# Patient Record
Sex: Female | Born: 1935
Health system: Southern US, Community
[De-identification: ages and names within clinical notes are randomized; demographics above are authoritative.]

## PROBLEM LIST (undated history)

## (undated) DIAGNOSIS — I10 Essential (primary) hypertension: Secondary | ICD-10-CM

## (undated) DIAGNOSIS — E785 Hyperlipidemia, unspecified: Secondary | ICD-10-CM

## (undated) DIAGNOSIS — E119 Type 2 diabetes mellitus without complications: Secondary | ICD-10-CM

## (undated) DIAGNOSIS — I639 Cerebral infarction, unspecified: Secondary | ICD-10-CM

## (undated) HISTORY — DX: Cerebral infarction, unspecified: I63.9

---

## 2007-12-03 ENCOUNTER — Other Ambulatory Visit: Admission: RE | Admit: 2007-12-03 | Discharge: 2007-12-03 | Payer: Self-pay | Admitting: Internal Medicine

## 2008-01-18 ENCOUNTER — Encounter: Admission: RE | Admit: 2008-01-18 | Discharge: 2008-01-18 | Payer: Self-pay | Admitting: Internal Medicine

## 2009-01-20 ENCOUNTER — Encounter: Admission: RE | Admit: 2009-01-20 | Discharge: 2009-01-20 | Payer: Self-pay | Admitting: Internal Medicine

## 2010-02-17 ENCOUNTER — Encounter
Admission: RE | Admit: 2010-02-17 | Discharge: 2010-02-17 | Payer: Self-pay | Source: Home / Self Care | Attending: Internal Medicine | Admitting: Internal Medicine

## 2011-01-17 ENCOUNTER — Other Ambulatory Visit: Payer: Self-pay | Admitting: Internal Medicine

## 2011-01-17 DIAGNOSIS — Z1231 Encounter for screening mammogram for malignant neoplasm of breast: Secondary | ICD-10-CM

## 2011-02-23 ENCOUNTER — Ambulatory Visit: Payer: Self-pay

## 2011-03-04 ENCOUNTER — Ambulatory Visit
Admission: RE | Admit: 2011-03-04 | Discharge: 2011-03-04 | Disposition: A | Discharge status: Home / Self Care | Payer: Medicare Other | Source: Ambulatory Visit | Attending: Internal Medicine | Admitting: Internal Medicine

## 2011-03-04 DIAGNOSIS — Z1231 Encounter for screening mammogram for malignant neoplasm of breast: Secondary | ICD-10-CM

## 2012-02-08 ENCOUNTER — Other Ambulatory Visit: Payer: Self-pay | Admitting: Internal Medicine

## 2012-02-08 DIAGNOSIS — Z1231 Encounter for screening mammogram for malignant neoplasm of breast: Secondary | ICD-10-CM

## 2012-03-14 ENCOUNTER — Ambulatory Visit
Admission: RE | Admit: 2012-03-14 | Discharge: 2012-03-14 | Disposition: A | Discharge status: Home / Self Care | Payer: Medicare HMO | Source: Ambulatory Visit | Attending: Internal Medicine | Admitting: Internal Medicine

## 2012-03-14 DIAGNOSIS — Z1231 Encounter for screening mammogram for malignant neoplasm of breast: Secondary | ICD-10-CM

## 2012-03-20 ENCOUNTER — Other Ambulatory Visit: Payer: Self-pay | Admitting: Internal Medicine

## 2012-03-20 DIAGNOSIS — R928 Other abnormal and inconclusive findings on diagnostic imaging of breast: Secondary | ICD-10-CM

## 2012-04-18 ENCOUNTER — Ambulatory Visit
Admission: RE | Admit: 2012-04-18 | Discharge: 2012-04-18 | Disposition: A | Discharge status: Home / Self Care | Payer: Medicare PPO | Source: Ambulatory Visit | Attending: Internal Medicine | Admitting: Internal Medicine

## 2012-04-18 DIAGNOSIS — R928 Other abnormal and inconclusive findings on diagnostic imaging of breast: Secondary | ICD-10-CM

## 2012-11-07 ENCOUNTER — Other Ambulatory Visit: Payer: Self-pay | Admitting: Internal Medicine

## 2012-11-07 DIAGNOSIS — R921 Mammographic calcification found on diagnostic imaging of breast: Secondary | ICD-10-CM

## 2013-10-07 ENCOUNTER — Encounter (INDEPENDENT_AMBULATORY_CARE_PROVIDER_SITE_OTHER): Payer: Commercial Managed Care - HMO | Admitting: Ophthalmology

## 2013-10-07 DIAGNOSIS — H43819 Vitreous degeneration, unspecified eye: Secondary | ICD-10-CM

## 2013-10-07 DIAGNOSIS — H3530 Unspecified macular degeneration: Secondary | ICD-10-CM

## 2013-10-07 DIAGNOSIS — I10 Essential (primary) hypertension: Secondary | ICD-10-CM

## 2013-10-07 DIAGNOSIS — H35039 Hypertensive retinopathy, unspecified eye: Secondary | ICD-10-CM

## 2013-10-07 DIAGNOSIS — H353290 Exudative age-related macular degeneration, unspecified eye, stage unspecified: Secondary | ICD-10-CM

## 2013-11-13 ENCOUNTER — Encounter (INDEPENDENT_AMBULATORY_CARE_PROVIDER_SITE_OTHER): Payer: Commercial Managed Care - HMO | Admitting: Ophthalmology

## 2013-11-13 DIAGNOSIS — H353290 Exudative age-related macular degeneration, unspecified eye, stage unspecified: Secondary | ICD-10-CM

## 2013-11-13 DIAGNOSIS — H35039 Hypertensive retinopathy, unspecified eye: Secondary | ICD-10-CM

## 2013-11-13 DIAGNOSIS — I10 Essential (primary) hypertension: Secondary | ICD-10-CM

## 2013-11-13 DIAGNOSIS — H2510 Age-related nuclear cataract, unspecified eye: Secondary | ICD-10-CM

## 2013-11-13 DIAGNOSIS — H3530 Unspecified macular degeneration: Secondary | ICD-10-CM

## 2013-11-13 DIAGNOSIS — H43819 Vitreous degeneration, unspecified eye: Secondary | ICD-10-CM

## 2013-12-23 ENCOUNTER — Encounter (INDEPENDENT_AMBULATORY_CARE_PROVIDER_SITE_OTHER): Payer: Commercial Managed Care - HMO | Admitting: Ophthalmology

## 2013-12-23 DIAGNOSIS — H2513 Age-related nuclear cataract, bilateral: Secondary | ICD-10-CM

## 2013-12-23 DIAGNOSIS — E11329 Type 2 diabetes mellitus with mild nonproliferative diabetic retinopathy without macular edema: Secondary | ICD-10-CM

## 2013-12-23 DIAGNOSIS — H35033 Hypertensive retinopathy, bilateral: Secondary | ICD-10-CM

## 2013-12-23 DIAGNOSIS — H43813 Vitreous degeneration, bilateral: Secondary | ICD-10-CM | POA: Diagnosis not present

## 2013-12-23 DIAGNOSIS — I10 Essential (primary) hypertension: Secondary | ICD-10-CM | POA: Diagnosis not present

## 2013-12-23 DIAGNOSIS — E11319 Type 2 diabetes mellitus with unspecified diabetic retinopathy without macular edema: Secondary | ICD-10-CM | POA: Diagnosis not present

## 2013-12-23 DIAGNOSIS — H3532 Exudative age-related macular degeneration: Secondary | ICD-10-CM | POA: Diagnosis not present

## 2013-12-23 DIAGNOSIS — H3531 Nonexudative age-related macular degeneration: Secondary | ICD-10-CM | POA: Diagnosis not present

## 2013-12-25 ENCOUNTER — Encounter (INDEPENDENT_AMBULATORY_CARE_PROVIDER_SITE_OTHER): Payer: Commercial Managed Care - HMO | Admitting: Ophthalmology

## 2014-01-22 ENCOUNTER — Encounter (INDEPENDENT_AMBULATORY_CARE_PROVIDER_SITE_OTHER): Payer: Commercial Managed Care - HMO | Admitting: Ophthalmology

## 2014-01-22 DIAGNOSIS — H43813 Vitreous degeneration, bilateral: Secondary | ICD-10-CM | POA: Diagnosis not present

## 2014-01-22 DIAGNOSIS — H35033 Hypertensive retinopathy, bilateral: Secondary | ICD-10-CM

## 2014-01-22 DIAGNOSIS — H3532 Exudative age-related macular degeneration: Secondary | ICD-10-CM

## 2014-01-22 DIAGNOSIS — I10 Essential (primary) hypertension: Secondary | ICD-10-CM | POA: Diagnosis not present

## 2014-01-22 DIAGNOSIS — H3531 Nonexudative age-related macular degeneration: Secondary | ICD-10-CM

## 2014-01-22 DIAGNOSIS — E11329 Type 2 diabetes mellitus with mild nonproliferative diabetic retinopathy without macular edema: Secondary | ICD-10-CM

## 2014-01-22 DIAGNOSIS — E11319 Type 2 diabetes mellitus with unspecified diabetic retinopathy without macular edema: Secondary | ICD-10-CM

## 2014-01-27 ENCOUNTER — Encounter (INDEPENDENT_AMBULATORY_CARE_PROVIDER_SITE_OTHER): Payer: Commercial Managed Care - HMO | Admitting: Ophthalmology

## 2014-03-04 ENCOUNTER — Encounter (INDEPENDENT_AMBULATORY_CARE_PROVIDER_SITE_OTHER): Payer: Commercial Managed Care - HMO | Admitting: Ophthalmology

## 2014-03-17 ENCOUNTER — Encounter (INDEPENDENT_AMBULATORY_CARE_PROVIDER_SITE_OTHER): Payer: Commercial Managed Care - HMO | Admitting: Ophthalmology

## 2014-03-17 DIAGNOSIS — E11329 Type 2 diabetes mellitus with mild nonproliferative diabetic retinopathy without macular edema: Secondary | ICD-10-CM

## 2014-03-17 DIAGNOSIS — H3531 Nonexudative age-related macular degeneration: Secondary | ICD-10-CM

## 2014-03-17 DIAGNOSIS — H2513 Age-related nuclear cataract, bilateral: Secondary | ICD-10-CM | POA: Diagnosis not present

## 2014-03-17 DIAGNOSIS — H3532 Exudative age-related macular degeneration: Secondary | ICD-10-CM | POA: Diagnosis not present

## 2014-03-17 DIAGNOSIS — H35033 Hypertensive retinopathy, bilateral: Secondary | ICD-10-CM

## 2014-03-17 DIAGNOSIS — H43813 Vitreous degeneration, bilateral: Secondary | ICD-10-CM

## 2014-03-17 DIAGNOSIS — E11319 Type 2 diabetes mellitus with unspecified diabetic retinopathy without macular edema: Secondary | ICD-10-CM

## 2014-05-12 ENCOUNTER — Encounter (INDEPENDENT_AMBULATORY_CARE_PROVIDER_SITE_OTHER): Payer: Commercial Managed Care - HMO | Admitting: Ophthalmology

## 2014-05-12 DIAGNOSIS — H43813 Vitreous degeneration, bilateral: Secondary | ICD-10-CM

## 2014-05-12 DIAGNOSIS — I10 Essential (primary) hypertension: Secondary | ICD-10-CM

## 2014-05-12 DIAGNOSIS — H3532 Exudative age-related macular degeneration: Secondary | ICD-10-CM | POA: Diagnosis not present

## 2014-05-12 DIAGNOSIS — H35033 Hypertensive retinopathy, bilateral: Secondary | ICD-10-CM | POA: Diagnosis not present

## 2014-05-12 DIAGNOSIS — E11319 Type 2 diabetes mellitus with unspecified diabetic retinopathy without macular edema: Secondary | ICD-10-CM | POA: Diagnosis not present

## 2014-05-12 DIAGNOSIS — E11329 Type 2 diabetes mellitus with mild nonproliferative diabetic retinopathy without macular edema: Secondary | ICD-10-CM

## 2014-05-12 DIAGNOSIS — H3531 Nonexudative age-related macular degeneration: Secondary | ICD-10-CM | POA: Diagnosis not present

## 2014-07-04 DIAGNOSIS — M810 Age-related osteoporosis without current pathological fracture: Secondary | ICD-10-CM | POA: Diagnosis not present

## 2014-07-04 DIAGNOSIS — R269 Unspecified abnormalities of gait and mobility: Secondary | ICD-10-CM | POA: Diagnosis not present

## 2014-07-04 DIAGNOSIS — E1165 Type 2 diabetes mellitus with hyperglycemia: Secondary | ICD-10-CM | POA: Diagnosis not present

## 2014-07-04 DIAGNOSIS — Z1389 Encounter for screening for other disorder: Secondary | ICD-10-CM | POA: Diagnosis not present

## 2014-07-04 DIAGNOSIS — E039 Hypothyroidism, unspecified: Secondary | ICD-10-CM | POA: Diagnosis not present

## 2014-07-04 DIAGNOSIS — I10 Essential (primary) hypertension: Secondary | ICD-10-CM | POA: Diagnosis not present

## 2014-07-04 DIAGNOSIS — Z0000 Encounter for general adult medical examination without abnormal findings: Secondary | ICD-10-CM | POA: Diagnosis not present

## 2014-07-04 DIAGNOSIS — E782 Mixed hyperlipidemia: Secondary | ICD-10-CM | POA: Diagnosis not present

## 2014-07-24 DIAGNOSIS — M25551 Pain in right hip: Secondary | ICD-10-CM | POA: Diagnosis not present

## 2014-08-04 ENCOUNTER — Encounter (INDEPENDENT_AMBULATORY_CARE_PROVIDER_SITE_OTHER): Payer: Commercial Managed Care - HMO | Admitting: Ophthalmology

## 2014-08-08 DIAGNOSIS — M25551 Pain in right hip: Secondary | ICD-10-CM | POA: Diagnosis not present

## 2014-08-12 DIAGNOSIS — R262 Difficulty in walking, not elsewhere classified: Secondary | ICD-10-CM | POA: Diagnosis not present

## 2014-08-12 DIAGNOSIS — R0789 Other chest pain: Secondary | ICD-10-CM | POA: Diagnosis not present

## 2014-08-15 ENCOUNTER — Encounter (INDEPENDENT_AMBULATORY_CARE_PROVIDER_SITE_OTHER): Payer: Commercial Managed Care - HMO | Admitting: Ophthalmology

## 2014-08-15 DIAGNOSIS — H3532 Exudative age-related macular degeneration: Secondary | ICD-10-CM | POA: Diagnosis not present

## 2014-08-15 DIAGNOSIS — E11329 Type 2 diabetes mellitus with mild nonproliferative diabetic retinopathy without macular edema: Secondary | ICD-10-CM

## 2014-08-15 DIAGNOSIS — H35033 Hypertensive retinopathy, bilateral: Secondary | ICD-10-CM

## 2014-08-15 DIAGNOSIS — E11319 Type 2 diabetes mellitus with unspecified diabetic retinopathy without macular edema: Secondary | ICD-10-CM | POA: Diagnosis not present

## 2014-08-15 DIAGNOSIS — H43813 Vitreous degeneration, bilateral: Secondary | ICD-10-CM | POA: Diagnosis not present

## 2014-08-15 DIAGNOSIS — I10 Essential (primary) hypertension: Secondary | ICD-10-CM | POA: Diagnosis not present

## 2014-08-15 DIAGNOSIS — H3531 Nonexudative age-related macular degeneration: Secondary | ICD-10-CM

## 2014-08-27 DIAGNOSIS — M25551 Pain in right hip: Secondary | ICD-10-CM | POA: Diagnosis not present

## 2014-11-05 ENCOUNTER — Other Ambulatory Visit: Payer: Self-pay | Admitting: Internal Medicine

## 2014-11-05 ENCOUNTER — Ambulatory Visit
Admission: RE | Admit: 2014-11-05 | Discharge: 2014-11-05 | Disposition: A | Discharge status: Home / Self Care | Payer: Commercial Managed Care - HMO | Source: Ambulatory Visit | Attending: Internal Medicine | Admitting: Internal Medicine

## 2014-11-05 DIAGNOSIS — M25559 Pain in unspecified hip: Secondary | ICD-10-CM | POA: Diagnosis not present

## 2014-11-05 DIAGNOSIS — E1165 Type 2 diabetes mellitus with hyperglycemia: Secondary | ICD-10-CM | POA: Diagnosis not present

## 2014-11-05 DIAGNOSIS — M25551 Pain in right hip: Secondary | ICD-10-CM

## 2014-11-05 DIAGNOSIS — M1611 Unilateral primary osteoarthritis, right hip: Secondary | ICD-10-CM | POA: Diagnosis not present

## 2014-11-05 DIAGNOSIS — E039 Hypothyroidism, unspecified: Secondary | ICD-10-CM | POA: Diagnosis not present

## 2014-11-05 DIAGNOSIS — M810 Age-related osteoporosis without current pathological fracture: Secondary | ICD-10-CM | POA: Diagnosis not present

## 2014-11-05 DIAGNOSIS — I10 Essential (primary) hypertension: Secondary | ICD-10-CM | POA: Diagnosis not present

## 2014-11-05 DIAGNOSIS — E782 Mixed hyperlipidemia: Secondary | ICD-10-CM | POA: Diagnosis not present

## 2014-11-24 ENCOUNTER — Encounter (INDEPENDENT_AMBULATORY_CARE_PROVIDER_SITE_OTHER): Payer: Commercial Managed Care - HMO | Admitting: Ophthalmology

## 2014-12-19 ENCOUNTER — Encounter (INDEPENDENT_AMBULATORY_CARE_PROVIDER_SITE_OTHER): Payer: Commercial Managed Care - HMO | Admitting: Ophthalmology

## 2014-12-19 DIAGNOSIS — H353211 Exudative age-related macular degeneration, right eye, with active choroidal neovascularization: Secondary | ICD-10-CM | POA: Diagnosis not present

## 2014-12-19 DIAGNOSIS — H35033 Hypertensive retinopathy, bilateral: Secondary | ICD-10-CM | POA: Diagnosis not present

## 2014-12-19 DIAGNOSIS — I10 Essential (primary) hypertension: Secondary | ICD-10-CM

## 2014-12-19 DIAGNOSIS — H353122 Nonexudative age-related macular degeneration, left eye, intermediate dry stage: Secondary | ICD-10-CM

## 2014-12-19 DIAGNOSIS — E113213 Type 2 diabetes mellitus with mild nonproliferative diabetic retinopathy with macular edema, bilateral: Secondary | ICD-10-CM

## 2014-12-19 DIAGNOSIS — H43813 Vitreous degeneration, bilateral: Secondary | ICD-10-CM

## 2014-12-19 DIAGNOSIS — E11319 Type 2 diabetes mellitus with unspecified diabetic retinopathy without macular edema: Secondary | ICD-10-CM

## 2015-01-28 DIAGNOSIS — H35033 Hypertensive retinopathy, bilateral: Secondary | ICD-10-CM | POA: Diagnosis not present

## 2015-01-28 DIAGNOSIS — H524 Presbyopia: Secondary | ICD-10-CM | POA: Diagnosis not present

## 2015-01-28 DIAGNOSIS — E119 Type 2 diabetes mellitus without complications: Secondary | ICD-10-CM | POA: Diagnosis not present

## 2015-01-28 DIAGNOSIS — H353212 Exudative age-related macular degeneration, right eye, with inactive choroidal neovascularization: Secondary | ICD-10-CM | POA: Diagnosis not present

## 2015-01-28 DIAGNOSIS — H2512 Age-related nuclear cataract, left eye: Secondary | ICD-10-CM | POA: Diagnosis not present

## 2015-01-28 DIAGNOSIS — H353121 Nonexudative age-related macular degeneration, left eye, early dry stage: Secondary | ICD-10-CM | POA: Diagnosis not present

## 2015-01-28 DIAGNOSIS — H25012 Cortical age-related cataract, left eye: Secondary | ICD-10-CM | POA: Diagnosis not present

## 2015-01-28 DIAGNOSIS — H3563 Retinal hemorrhage, bilateral: Secondary | ICD-10-CM | POA: Diagnosis not present

## 2015-03-17 DIAGNOSIS — H2512 Age-related nuclear cataract, left eye: Secondary | ICD-10-CM | POA: Diagnosis not present

## 2015-04-10 ENCOUNTER — Encounter (INDEPENDENT_AMBULATORY_CARE_PROVIDER_SITE_OTHER): Payer: Commercial Managed Care - HMO | Admitting: Ophthalmology

## 2015-05-06 ENCOUNTER — Encounter (INDEPENDENT_AMBULATORY_CARE_PROVIDER_SITE_OTHER): Payer: Commercial Managed Care - HMO | Admitting: Ophthalmology

## 2015-05-07 ENCOUNTER — Encounter (INDEPENDENT_AMBULATORY_CARE_PROVIDER_SITE_OTHER): Payer: Commercial Managed Care - HMO | Admitting: Ophthalmology

## 2015-05-07 DIAGNOSIS — H353123 Nonexudative age-related macular degeneration, left eye, advanced atrophic without subfoveal involvement: Secondary | ICD-10-CM | POA: Diagnosis not present

## 2015-05-07 DIAGNOSIS — H353211 Exudative age-related macular degeneration, right eye, with active choroidal neovascularization: Secondary | ICD-10-CM

## 2015-05-07 DIAGNOSIS — H35033 Hypertensive retinopathy, bilateral: Secondary | ICD-10-CM | POA: Diagnosis not present

## 2015-05-07 DIAGNOSIS — H43813 Vitreous degeneration, bilateral: Secondary | ICD-10-CM

## 2015-05-07 DIAGNOSIS — I10 Essential (primary) hypertension: Secondary | ICD-10-CM | POA: Diagnosis not present

## 2015-07-15 DIAGNOSIS — J45909 Unspecified asthma, uncomplicated: Secondary | ICD-10-CM | POA: Diagnosis not present

## 2015-07-15 DIAGNOSIS — E039 Hypothyroidism, unspecified: Secondary | ICD-10-CM | POA: Diagnosis not present

## 2015-07-15 DIAGNOSIS — M810 Age-related osteoporosis without current pathological fracture: Secondary | ICD-10-CM | POA: Diagnosis not present

## 2015-07-15 DIAGNOSIS — M25559 Pain in unspecified hip: Secondary | ICD-10-CM | POA: Diagnosis not present

## 2015-07-15 DIAGNOSIS — E7800 Pure hypercholesterolemia, unspecified: Secondary | ICD-10-CM | POA: Diagnosis not present

## 2015-07-15 DIAGNOSIS — Z1389 Encounter for screening for other disorder: Secondary | ICD-10-CM | POA: Diagnosis not present

## 2015-07-15 DIAGNOSIS — Z0000 Encounter for general adult medical examination without abnormal findings: Secondary | ICD-10-CM | POA: Diagnosis not present

## 2015-07-15 DIAGNOSIS — I10 Essential (primary) hypertension: Secondary | ICD-10-CM | POA: Diagnosis not present

## 2015-07-15 DIAGNOSIS — E119 Type 2 diabetes mellitus without complications: Secondary | ICD-10-CM | POA: Diagnosis not present

## 2015-07-20 DIAGNOSIS — M1611 Unilateral primary osteoarthritis, right hip: Secondary | ICD-10-CM | POA: Diagnosis not present

## 2015-07-20 DIAGNOSIS — M545 Low back pain: Secondary | ICD-10-CM | POA: Diagnosis not present

## 2015-09-16 DIAGNOSIS — M1611 Unilateral primary osteoarthritis, right hip: Secondary | ICD-10-CM | POA: Diagnosis not present

## 2015-10-05 ENCOUNTER — Encounter (INDEPENDENT_AMBULATORY_CARE_PROVIDER_SITE_OTHER): Payer: Self-pay

## 2015-10-05 ENCOUNTER — Encounter (INDEPENDENT_AMBULATORY_CARE_PROVIDER_SITE_OTHER): Payer: Commercial Managed Care - HMO | Admitting: Ophthalmology

## 2015-10-08 ENCOUNTER — Encounter (INDEPENDENT_AMBULATORY_CARE_PROVIDER_SITE_OTHER): Payer: Commercial Managed Care - HMO | Admitting: Ophthalmology

## 2015-10-14 ENCOUNTER — Encounter (INDEPENDENT_AMBULATORY_CARE_PROVIDER_SITE_OTHER): Payer: Commercial Managed Care - HMO | Admitting: Ophthalmology

## 2015-10-14 DIAGNOSIS — H35033 Hypertensive retinopathy, bilateral: Secondary | ICD-10-CM | POA: Diagnosis not present

## 2015-10-14 DIAGNOSIS — I10 Essential (primary) hypertension: Secondary | ICD-10-CM

## 2015-10-14 DIAGNOSIS — H353211 Exudative age-related macular degeneration, right eye, with active choroidal neovascularization: Secondary | ICD-10-CM

## 2015-10-14 DIAGNOSIS — H353122 Nonexudative age-related macular degeneration, left eye, intermediate dry stage: Secondary | ICD-10-CM

## 2015-10-14 DIAGNOSIS — H43813 Vitreous degeneration, bilateral: Secondary | ICD-10-CM | POA: Diagnosis not present

## 2015-10-22 ENCOUNTER — Encounter (INDEPENDENT_AMBULATORY_CARE_PROVIDER_SITE_OTHER): Payer: Commercial Managed Care - HMO | Admitting: Ophthalmology

## 2016-01-15 DIAGNOSIS — E039 Hypothyroidism, unspecified: Secondary | ICD-10-CM | POA: Diagnosis not present

## 2016-01-15 DIAGNOSIS — Z7984 Long term (current) use of oral hypoglycemic drugs: Secondary | ICD-10-CM | POA: Diagnosis not present

## 2016-01-15 DIAGNOSIS — I10 Essential (primary) hypertension: Secondary | ICD-10-CM | POA: Diagnosis not present

## 2016-01-15 DIAGNOSIS — M810 Age-related osteoporosis without current pathological fracture: Secondary | ICD-10-CM | POA: Diagnosis not present

## 2016-01-15 DIAGNOSIS — E119 Type 2 diabetes mellitus without complications: Secondary | ICD-10-CM | POA: Diagnosis not present

## 2016-01-15 DIAGNOSIS — E7800 Pure hypercholesterolemia, unspecified: Secondary | ICD-10-CM | POA: Diagnosis not present

## 2016-03-16 ENCOUNTER — Encounter (INDEPENDENT_AMBULATORY_CARE_PROVIDER_SITE_OTHER): Payer: Commercial Managed Care - HMO | Admitting: Ophthalmology

## 2016-05-09 DIAGNOSIS — M1611 Unilateral primary osteoarthritis, right hip: Secondary | ICD-10-CM | POA: Diagnosis not present

## 2016-05-16 DIAGNOSIS — M1611 Unilateral primary osteoarthritis, right hip: Secondary | ICD-10-CM | POA: Diagnosis not present

## 2016-09-08 DIAGNOSIS — I10 Essential (primary) hypertension: Secondary | ICD-10-CM | POA: Diagnosis not present

## 2016-09-08 DIAGNOSIS — Z0000 Encounter for general adult medical examination without abnormal findings: Secondary | ICD-10-CM | POA: Diagnosis not present

## 2016-09-08 DIAGNOSIS — E039 Hypothyroidism, unspecified: Secondary | ICD-10-CM | POA: Diagnosis not present

## 2016-09-08 DIAGNOSIS — M810 Age-related osteoporosis without current pathological fracture: Secondary | ICD-10-CM | POA: Diagnosis not present

## 2016-09-08 DIAGNOSIS — E7800 Pure hypercholesterolemia, unspecified: Secondary | ICD-10-CM | POA: Diagnosis not present

## 2016-09-08 DIAGNOSIS — Z1389 Encounter for screening for other disorder: Secondary | ICD-10-CM | POA: Diagnosis not present

## 2016-09-08 DIAGNOSIS — E1165 Type 2 diabetes mellitus with hyperglycemia: Secondary | ICD-10-CM | POA: Diagnosis not present

## 2016-10-03 DIAGNOSIS — E039 Hypothyroidism, unspecified: Secondary | ICD-10-CM | POA: Diagnosis not present

## 2017-02-02 DIAGNOSIS — R05 Cough: Secondary | ICD-10-CM | POA: Diagnosis not present

## 2017-03-16 DIAGNOSIS — I10 Essential (primary) hypertension: Secondary | ICD-10-CM | POA: Diagnosis not present

## 2017-03-16 DIAGNOSIS — E7800 Pure hypercholesterolemia, unspecified: Secondary | ICD-10-CM | POA: Diagnosis not present

## 2017-03-16 DIAGNOSIS — Z7984 Long term (current) use of oral hypoglycemic drugs: Secondary | ICD-10-CM | POA: Diagnosis not present

## 2017-03-16 DIAGNOSIS — M810 Age-related osteoporosis without current pathological fracture: Secondary | ICD-10-CM | POA: Diagnosis not present

## 2017-03-16 DIAGNOSIS — E039 Hypothyroidism, unspecified: Secondary | ICD-10-CM | POA: Diagnosis not present

## 2017-03-16 DIAGNOSIS — E119 Type 2 diabetes mellitus without complications: Secondary | ICD-10-CM | POA: Diagnosis not present

## 2017-09-15 DIAGNOSIS — Z7984 Long term (current) use of oral hypoglycemic drugs: Secondary | ICD-10-CM | POA: Diagnosis not present

## 2017-09-15 DIAGNOSIS — E039 Hypothyroidism, unspecified: Secondary | ICD-10-CM | POA: Diagnosis not present

## 2017-09-15 DIAGNOSIS — E1169 Type 2 diabetes mellitus with other specified complication: Secondary | ICD-10-CM | POA: Diagnosis not present

## 2017-09-15 DIAGNOSIS — Z0000 Encounter for general adult medical examination without abnormal findings: Secondary | ICD-10-CM | POA: Diagnosis not present

## 2017-09-15 DIAGNOSIS — I10 Essential (primary) hypertension: Secondary | ICD-10-CM | POA: Diagnosis not present

## 2017-09-15 DIAGNOSIS — E7800 Pure hypercholesterolemia, unspecified: Secondary | ICD-10-CM | POA: Diagnosis not present

## 2017-09-15 DIAGNOSIS — M810 Age-related osteoporosis without current pathological fracture: Secondary | ICD-10-CM | POA: Diagnosis not present

## 2017-09-15 DIAGNOSIS — Z135 Encounter for screening for eye and ear disorders: Secondary | ICD-10-CM | POA: Diagnosis not present

## 2017-09-15 DIAGNOSIS — Z1389 Encounter for screening for other disorder: Secondary | ICD-10-CM | POA: Diagnosis not present

## 2017-10-12 ENCOUNTER — Ambulatory Visit
Admission: RE | Admit: 2017-10-12 | Discharge: 2017-10-12 | Disposition: A | Discharge status: Home / Self Care | Payer: Commercial Managed Care - HMO | Source: Ambulatory Visit | Attending: Internal Medicine | Admitting: Internal Medicine

## 2017-10-12 ENCOUNTER — Other Ambulatory Visit: Payer: Self-pay | Admitting: Internal Medicine

## 2017-10-12 DIAGNOSIS — R05 Cough: Secondary | ICD-10-CM

## 2017-10-12 DIAGNOSIS — R0981 Nasal congestion: Secondary | ICD-10-CM | POA: Diagnosis not present

## 2017-10-12 DIAGNOSIS — R059 Cough, unspecified: Secondary | ICD-10-CM

## 2017-12-25 DIAGNOSIS — M1611 Unilateral primary osteoarthritis, right hip: Secondary | ICD-10-CM | POA: Diagnosis not present

## 2017-12-27 DIAGNOSIS — M1611 Unilateral primary osteoarthritis, right hip: Secondary | ICD-10-CM | POA: Diagnosis not present

## 2018-03-01 ENCOUNTER — Other Ambulatory Visit: Payer: Self-pay | Admitting: Internal Medicine

## 2018-03-01 ENCOUNTER — Ambulatory Visit
Admission: RE | Admit: 2018-03-01 | Discharge: 2018-03-01 | Disposition: A | Discharge status: Home / Self Care | Payer: Medicare HMO | Source: Ambulatory Visit | Attending: Internal Medicine | Admitting: Internal Medicine

## 2018-03-01 DIAGNOSIS — E1169 Type 2 diabetes mellitus with other specified complication: Secondary | ICD-10-CM | POA: Diagnosis not present

## 2018-03-01 DIAGNOSIS — M25551 Pain in right hip: Secondary | ICD-10-CM

## 2018-03-01 DIAGNOSIS — M1611 Unilateral primary osteoarthritis, right hip: Secondary | ICD-10-CM | POA: Diagnosis not present

## 2018-03-01 DIAGNOSIS — Z9181 History of falling: Secondary | ICD-10-CM | POA: Diagnosis not present

## 2018-03-01 DIAGNOSIS — R05 Cough: Secondary | ICD-10-CM | POA: Diagnosis not present

## 2018-03-01 DIAGNOSIS — I10 Essential (primary) hypertension: Secondary | ICD-10-CM | POA: Diagnosis not present

## 2018-03-01 DIAGNOSIS — R634 Abnormal weight loss: Secondary | ICD-10-CM | POA: Diagnosis not present

## 2018-03-01 DIAGNOSIS — E039 Hypothyroidism, unspecified: Secondary | ICD-10-CM | POA: Diagnosis not present

## 2018-03-01 DIAGNOSIS — E7800 Pure hypercholesterolemia, unspecified: Secondary | ICD-10-CM | POA: Diagnosis not present

## 2018-03-01 DIAGNOSIS — R053 Chronic cough: Secondary | ICD-10-CM

## 2018-03-05 ENCOUNTER — Ambulatory Visit
Admission: RE | Admit: 2018-03-05 | Discharge: 2018-03-05 | Disposition: A | Discharge status: Home / Self Care | Payer: Medicare HMO | Source: Ambulatory Visit | Attending: Internal Medicine | Admitting: Internal Medicine

## 2018-03-05 DIAGNOSIS — I700 Atherosclerosis of aorta: Secondary | ICD-10-CM | POA: Diagnosis not present

## 2018-03-05 DIAGNOSIS — R053 Chronic cough: Secondary | ICD-10-CM

## 2018-03-05 DIAGNOSIS — R05 Cough: Secondary | ICD-10-CM

## 2018-03-06 DIAGNOSIS — E7800 Pure hypercholesterolemia, unspecified: Secondary | ICD-10-CM | POA: Diagnosis not present

## 2018-03-06 DIAGNOSIS — I10 Essential (primary) hypertension: Secondary | ICD-10-CM | POA: Diagnosis not present

## 2018-03-06 DIAGNOSIS — R296 Repeated falls: Secondary | ICD-10-CM | POA: Diagnosis not present

## 2018-03-06 DIAGNOSIS — M1611 Unilateral primary osteoarthritis, right hip: Secondary | ICD-10-CM | POA: Diagnosis not present

## 2018-03-06 DIAGNOSIS — M810 Age-related osteoporosis without current pathological fracture: Secondary | ICD-10-CM | POA: Diagnosis not present

## 2018-03-06 DIAGNOSIS — E119 Type 2 diabetes mellitus without complications: Secondary | ICD-10-CM | POA: Diagnosis not present

## 2018-03-06 DIAGNOSIS — E039 Hypothyroidism, unspecified: Secondary | ICD-10-CM | POA: Diagnosis not present

## 2018-03-06 DIAGNOSIS — Z7984 Long term (current) use of oral hypoglycemic drugs: Secondary | ICD-10-CM | POA: Diagnosis not present

## 2018-03-09 DIAGNOSIS — M1611 Unilateral primary osteoarthritis, right hip: Secondary | ICD-10-CM | POA: Diagnosis not present

## 2018-03-09 DIAGNOSIS — M810 Age-related osteoporosis without current pathological fracture: Secondary | ICD-10-CM | POA: Diagnosis not present

## 2018-03-09 DIAGNOSIS — R296 Repeated falls: Secondary | ICD-10-CM | POA: Diagnosis not present

## 2018-03-09 DIAGNOSIS — I10 Essential (primary) hypertension: Secondary | ICD-10-CM | POA: Diagnosis not present

## 2018-03-09 DIAGNOSIS — Z7984 Long term (current) use of oral hypoglycemic drugs: Secondary | ICD-10-CM | POA: Diagnosis not present

## 2018-03-09 DIAGNOSIS — E7800 Pure hypercholesterolemia, unspecified: Secondary | ICD-10-CM | POA: Diagnosis not present

## 2018-03-09 DIAGNOSIS — E119 Type 2 diabetes mellitus without complications: Secondary | ICD-10-CM | POA: Diagnosis not present

## 2018-03-09 DIAGNOSIS — E039 Hypothyroidism, unspecified: Secondary | ICD-10-CM | POA: Diagnosis not present

## 2018-03-12 DIAGNOSIS — E7800 Pure hypercholesterolemia, unspecified: Secondary | ICD-10-CM | POA: Diagnosis not present

## 2018-03-12 DIAGNOSIS — R296 Repeated falls: Secondary | ICD-10-CM | POA: Diagnosis not present

## 2018-03-12 DIAGNOSIS — M1611 Unilateral primary osteoarthritis, right hip: Secondary | ICD-10-CM | POA: Diagnosis not present

## 2018-03-12 DIAGNOSIS — I10 Essential (primary) hypertension: Secondary | ICD-10-CM | POA: Diagnosis not present

## 2018-03-12 DIAGNOSIS — E119 Type 2 diabetes mellitus without complications: Secondary | ICD-10-CM | POA: Diagnosis not present

## 2018-03-12 DIAGNOSIS — Z7984 Long term (current) use of oral hypoglycemic drugs: Secondary | ICD-10-CM | POA: Diagnosis not present

## 2018-03-12 DIAGNOSIS — M810 Age-related osteoporosis without current pathological fracture: Secondary | ICD-10-CM | POA: Diagnosis not present

## 2018-03-12 DIAGNOSIS — E039 Hypothyroidism, unspecified: Secondary | ICD-10-CM | POA: Diagnosis not present

## 2018-03-14 DIAGNOSIS — E119 Type 2 diabetes mellitus without complications: Secondary | ICD-10-CM | POA: Diagnosis not present

## 2018-03-14 DIAGNOSIS — R296 Repeated falls: Secondary | ICD-10-CM | POA: Diagnosis not present

## 2018-03-14 DIAGNOSIS — E7800 Pure hypercholesterolemia, unspecified: Secondary | ICD-10-CM | POA: Diagnosis not present

## 2018-03-14 DIAGNOSIS — E039 Hypothyroidism, unspecified: Secondary | ICD-10-CM | POA: Diagnosis not present

## 2018-03-14 DIAGNOSIS — Z7984 Long term (current) use of oral hypoglycemic drugs: Secondary | ICD-10-CM | POA: Diagnosis not present

## 2018-03-14 DIAGNOSIS — I10 Essential (primary) hypertension: Secondary | ICD-10-CM | POA: Diagnosis not present

## 2018-03-14 DIAGNOSIS — M810 Age-related osteoporosis without current pathological fracture: Secondary | ICD-10-CM | POA: Diagnosis not present

## 2018-03-14 DIAGNOSIS — M1611 Unilateral primary osteoarthritis, right hip: Secondary | ICD-10-CM | POA: Diagnosis not present

## 2018-03-19 DIAGNOSIS — I10 Essential (primary) hypertension: Secondary | ICD-10-CM | POA: Diagnosis not present

## 2018-03-19 DIAGNOSIS — E039 Hypothyroidism, unspecified: Secondary | ICD-10-CM | POA: Diagnosis not present

## 2018-03-19 DIAGNOSIS — E119 Type 2 diabetes mellitus without complications: Secondary | ICD-10-CM | POA: Diagnosis not present

## 2018-03-19 DIAGNOSIS — Z7984 Long term (current) use of oral hypoglycemic drugs: Secondary | ICD-10-CM | POA: Diagnosis not present

## 2018-03-19 DIAGNOSIS — M810 Age-related osteoporosis without current pathological fracture: Secondary | ICD-10-CM | POA: Diagnosis not present

## 2018-03-19 DIAGNOSIS — R296 Repeated falls: Secondary | ICD-10-CM | POA: Diagnosis not present

## 2018-03-19 DIAGNOSIS — M1611 Unilateral primary osteoarthritis, right hip: Secondary | ICD-10-CM | POA: Diagnosis not present

## 2018-03-19 DIAGNOSIS — E7800 Pure hypercholesterolemia, unspecified: Secondary | ICD-10-CM | POA: Diagnosis not present

## 2018-03-21 DIAGNOSIS — E039 Hypothyroidism, unspecified: Secondary | ICD-10-CM | POA: Diagnosis not present

## 2018-03-21 DIAGNOSIS — M810 Age-related osteoporosis without current pathological fracture: Secondary | ICD-10-CM | POA: Diagnosis not present

## 2018-03-21 DIAGNOSIS — M1611 Unilateral primary osteoarthritis, right hip: Secondary | ICD-10-CM | POA: Diagnosis not present

## 2018-03-21 DIAGNOSIS — E119 Type 2 diabetes mellitus without complications: Secondary | ICD-10-CM | POA: Diagnosis not present

## 2018-03-21 DIAGNOSIS — E7800 Pure hypercholesterolemia, unspecified: Secondary | ICD-10-CM | POA: Diagnosis not present

## 2018-03-21 DIAGNOSIS — Z7984 Long term (current) use of oral hypoglycemic drugs: Secondary | ICD-10-CM | POA: Diagnosis not present

## 2018-03-21 DIAGNOSIS — R296 Repeated falls: Secondary | ICD-10-CM | POA: Diagnosis not present

## 2018-03-21 DIAGNOSIS — I10 Essential (primary) hypertension: Secondary | ICD-10-CM | POA: Diagnosis not present

## 2018-03-27 DIAGNOSIS — M810 Age-related osteoporosis without current pathological fracture: Secondary | ICD-10-CM | POA: Diagnosis not present

## 2018-03-27 DIAGNOSIS — I10 Essential (primary) hypertension: Secondary | ICD-10-CM | POA: Diagnosis not present

## 2018-03-27 DIAGNOSIS — E119 Type 2 diabetes mellitus without complications: Secondary | ICD-10-CM | POA: Diagnosis not present

## 2018-03-27 DIAGNOSIS — Z7984 Long term (current) use of oral hypoglycemic drugs: Secondary | ICD-10-CM | POA: Diagnosis not present

## 2018-03-27 DIAGNOSIS — R296 Repeated falls: Secondary | ICD-10-CM | POA: Diagnosis not present

## 2018-03-27 DIAGNOSIS — E7800 Pure hypercholesterolemia, unspecified: Secondary | ICD-10-CM | POA: Diagnosis not present

## 2018-03-27 DIAGNOSIS — M1611 Unilateral primary osteoarthritis, right hip: Secondary | ICD-10-CM | POA: Diagnosis not present

## 2018-03-27 DIAGNOSIS — E039 Hypothyroidism, unspecified: Secondary | ICD-10-CM | POA: Diagnosis not present

## 2018-03-29 DIAGNOSIS — I10 Essential (primary) hypertension: Secondary | ICD-10-CM | POA: Diagnosis not present

## 2018-03-29 DIAGNOSIS — M1611 Unilateral primary osteoarthritis, right hip: Secondary | ICD-10-CM | POA: Diagnosis not present

## 2018-03-29 DIAGNOSIS — E039 Hypothyroidism, unspecified: Secondary | ICD-10-CM | POA: Diagnosis not present

## 2018-03-29 DIAGNOSIS — M810 Age-related osteoporosis without current pathological fracture: Secondary | ICD-10-CM | POA: Diagnosis not present

## 2018-03-29 DIAGNOSIS — R296 Repeated falls: Secondary | ICD-10-CM | POA: Diagnosis not present

## 2018-03-29 DIAGNOSIS — Z7984 Long term (current) use of oral hypoglycemic drugs: Secondary | ICD-10-CM | POA: Diagnosis not present

## 2018-03-29 DIAGNOSIS — E7800 Pure hypercholesterolemia, unspecified: Secondary | ICD-10-CM | POA: Diagnosis not present

## 2018-03-29 DIAGNOSIS — E119 Type 2 diabetes mellitus without complications: Secondary | ICD-10-CM | POA: Diagnosis not present

## 2018-05-20 ENCOUNTER — Encounter (HOSPITAL_COMMUNITY)
Admission: EM | Disposition: A | Discharge status: Home Health | Payer: Self-pay | Source: Home / Self Care | Attending: Internal Medicine

## 2018-05-20 ENCOUNTER — Emergency Department (HOSPITAL_COMMUNITY): Payer: Medicare HMO

## 2018-05-20 ENCOUNTER — Encounter (HOSPITAL_COMMUNITY): Payer: Self-pay | Admitting: Emergency Medicine

## 2018-05-20 ENCOUNTER — Inpatient Hospital Stay (HOSPITAL_COMMUNITY): Payer: Medicare HMO | Admitting: Anesthesiology

## 2018-05-20 ENCOUNTER — Inpatient Hospital Stay (HOSPITAL_COMMUNITY)
Admission: EM | Admit: 2018-05-20 | Discharge: 2018-05-28 | DRG: 327 | Disposition: A | Discharge status: Home Health | Payer: Medicare HMO | Attending: Internal Medicine | Admitting: Internal Medicine

## 2018-05-20 ENCOUNTER — Other Ambulatory Visit: Payer: Self-pay

## 2018-05-20 DIAGNOSIS — R748 Abnormal levels of other serum enzymes: Secondary | ICD-10-CM

## 2018-05-20 DIAGNOSIS — E440 Moderate protein-calorie malnutrition: Secondary | ICD-10-CM | POA: Diagnosis present

## 2018-05-20 DIAGNOSIS — E876 Hypokalemia: Secondary | ICD-10-CM

## 2018-05-20 DIAGNOSIS — E039 Hypothyroidism, unspecified: Secondary | ICD-10-CM | POA: Diagnosis present

## 2018-05-20 DIAGNOSIS — R2689 Other abnormalities of gait and mobility: Secondary | ICD-10-CM | POA: Diagnosis not present

## 2018-05-20 DIAGNOSIS — R109 Unspecified abdominal pain: Secondary | ICD-10-CM | POA: Diagnosis not present

## 2018-05-20 DIAGNOSIS — E860 Dehydration: Secondary | ICD-10-CM | POA: Diagnosis not present

## 2018-05-20 DIAGNOSIS — K3189 Other diseases of stomach and duodenum: Secondary | ICD-10-CM | POA: Diagnosis not present

## 2018-05-20 DIAGNOSIS — Z7984 Long term (current) use of oral hypoglycemic drugs: Secondary | ICD-10-CM

## 2018-05-20 DIAGNOSIS — Z87891 Personal history of nicotine dependence: Secondary | ICD-10-CM | POA: Diagnosis not present

## 2018-05-20 DIAGNOSIS — E1165 Type 2 diabetes mellitus with hyperglycemia: Secondary | ICD-10-CM | POA: Diagnosis not present

## 2018-05-20 DIAGNOSIS — Z833 Family history of diabetes mellitus: Secondary | ICD-10-CM

## 2018-05-20 DIAGNOSIS — R933 Abnormal findings on diagnostic imaging of other parts of digestive tract: Secondary | ICD-10-CM | POA: Diagnosis not present

## 2018-05-20 DIAGNOSIS — Z431 Encounter for attention to gastrostomy: Secondary | ICD-10-CM | POA: Diagnosis not present

## 2018-05-20 DIAGNOSIS — E119 Type 2 diabetes mellitus without complications: Secondary | ICD-10-CM

## 2018-05-20 DIAGNOSIS — Z6825 Body mass index (BMI) 25.0-25.9, adult: Secondary | ICD-10-CM

## 2018-05-20 DIAGNOSIS — R0602 Shortness of breath: Secondary | ICD-10-CM | POA: Diagnosis not present

## 2018-05-20 DIAGNOSIS — E872 Acidosis: Secondary | ICD-10-CM | POA: Diagnosis not present

## 2018-05-20 DIAGNOSIS — E785 Hyperlipidemia, unspecified: Secondary | ICD-10-CM

## 2018-05-20 DIAGNOSIS — E871 Hypo-osmolality and hyponatremia: Secondary | ICD-10-CM | POA: Diagnosis present

## 2018-05-20 DIAGNOSIS — Z809 Family history of malignant neoplasm, unspecified: Secondary | ICD-10-CM | POA: Diagnosis not present

## 2018-05-20 DIAGNOSIS — Z79890 Hormone replacement therapy (postmenopausal): Secondary | ICD-10-CM | POA: Diagnosis not present

## 2018-05-20 DIAGNOSIS — Z79899 Other long term (current) drug therapy: Secondary | ICD-10-CM | POA: Diagnosis not present

## 2018-05-20 DIAGNOSIS — I10 Essential (primary) hypertension: Secondary | ICD-10-CM | POA: Diagnosis not present

## 2018-05-20 DIAGNOSIS — K562 Volvulus: Secondary | ICD-10-CM | POA: Diagnosis not present

## 2018-05-20 DIAGNOSIS — R05 Cough: Secondary | ICD-10-CM | POA: Diagnosis not present

## 2018-05-20 HISTORY — PX: ESOPHAGOGASTRODUODENOSCOPY (EGD) WITH PROPOFOL: SHX5813

## 2018-05-20 HISTORY — PX: GASTROSTOMY: SHX5249

## 2018-05-20 HISTORY — DX: Hyperlipidemia, unspecified: E78.5

## 2018-05-20 HISTORY — PX: LAPAROTOMY: SHX154

## 2018-05-20 HISTORY — DX: Type 2 diabetes mellitus without complications: E11.9

## 2018-05-20 HISTORY — DX: Essential (primary) hypertension: I10

## 2018-05-20 LAB — GLUCOSE, CAPILLARY
Glucose-Capillary: 126 mg/dL — ABNORMAL HIGH (ref 70–99)
Glucose-Capillary: 131 mg/dL — ABNORMAL HIGH (ref 70–99)

## 2018-05-20 LAB — LIPASE, BLOOD: LIPASE: 153 U/L — AB (ref 11–51)

## 2018-05-20 LAB — CBC WITH DIFFERENTIAL/PLATELET
ABS IMMATURE GRANULOCYTES: 0.08 10*3/uL — AB (ref 0.00–0.07)
Basophils Absolute: 0 10*3/uL (ref 0.0–0.1)
Basophils Relative: 0 %
Eosinophils Absolute: 0 10*3/uL (ref 0.0–0.5)
Eosinophils Relative: 0 %
HCT: 40 % (ref 36.0–46.0)
HEMOGLOBIN: 14.5 g/dL (ref 12.0–15.0)
IMMATURE GRANULOCYTES: 1 %
LYMPHS ABS: 0.7 10*3/uL (ref 0.7–4.0)
Lymphocytes Relative: 6 %
MCH: 28.5 pg (ref 26.0–34.0)
MCHC: 36.3 g/dL — AB (ref 30.0–36.0)
MCV: 78.6 fL — ABNORMAL LOW (ref 80.0–100.0)
MONO ABS: 0.7 10*3/uL (ref 0.1–1.0)
MONOS PCT: 6 %
NEUTROS ABS: 10.4 10*3/uL — AB (ref 1.7–7.7)
NEUTROS PCT: 87 %
Platelets: 216 10*3/uL (ref 150–400)
RBC: 5.09 MIL/uL (ref 3.87–5.11)
RDW: 12.8 % (ref 11.5–15.5)
WBC: 11.9 10*3/uL — ABNORMAL HIGH (ref 4.0–10.5)
nRBC: 0 % (ref 0.0–0.2)

## 2018-05-20 LAB — COMPREHENSIVE METABOLIC PANEL
ALK PHOS: 58 U/L (ref 38–126)
ALT: 11 U/L (ref 0–44)
AST: 25 U/L (ref 15–41)
Albumin: 4.7 g/dL (ref 3.5–5.0)
Anion gap: 17 — ABNORMAL HIGH (ref 5–15)
BILIRUBIN TOTAL: 0.8 mg/dL (ref 0.3–1.2)
BUN: 11 mg/dL (ref 8–23)
CO2: 21 mmol/L — AB (ref 22–32)
Calcium: 9.6 mg/dL (ref 8.9–10.3)
Chloride: 92 mmol/L — ABNORMAL LOW (ref 98–111)
Creatinine, Ser: 0.73 mg/dL (ref 0.44–1.00)
GFR calc Af Amer: >60 mL/min (ref >60)
GFR calc non Af Amer: >60 mL/min (ref >60)
GLUCOSE: 304 mg/dL — AB (ref 70–99)
POTASSIUM: 3.7 mmol/L (ref 3.5–5.1)
SODIUM: 130 mmol/L — AB (ref 135–145)
TOTAL PROTEIN: 9.5 g/dL — AB (ref 6.5–8.1)

## 2018-05-20 LAB — LACTIC ACID, PLASMA: LACTIC ACID, VENOUS: 2.2 mmol/L — AB (ref 0.5–1.9)

## 2018-05-20 SURGERY — ESOPHAGOGASTRODUODENOSCOPY (EGD) WITH PROPOFOL
Anesthesia: General

## 2018-05-20 SURGERY — INSERTION OF GASTROSTOMY TUBE
Site: Abdomen

## 2018-05-20 MED ORDER — FENTANYL CITRATE (PF) 250 MCG/5ML IJ SOLN
INTRAMUSCULAR | Status: DC | PRN
  Administered 2018-05-20: 50 ug via INTRAVENOUS
  Administered 2018-05-20: 100 ug via INTRAVENOUS
  Administered 2018-05-20: 50 ug via INTRAVENOUS

## 2018-05-20 MED ORDER — 0.9 % SODIUM CHLORIDE (POUR BTL) OPTIME
TOPICAL | Status: DC | PRN
  Administered 2018-05-20: 1000 mL

## 2018-05-20 MED ORDER — HYDROMORPHONE HCL 1 MG/ML IJ SOLN
0.5000 mg | INTRAMUSCULAR | Status: DC | PRN
  Administered 2018-05-21: 0.5 mg via INTRAVENOUS
  Administered 2018-05-22: 1 mg via INTRAVENOUS
  Filled 2018-05-20 (×2): qty 1

## 2018-05-20 MED ORDER — LACTATED RINGERS IV SOLN
INTRAVENOUS | Status: DC | PRN
  Administered 2018-05-20: 18:00:00 via INTRAVENOUS

## 2018-05-20 MED ORDER — IOHEXOL 300 MG/ML  SOLN
100.0000 mL | Freq: Once | INTRAMUSCULAR | Status: AC | PRN
  Administered 2018-05-20: 100 mL via INTRAVENOUS

## 2018-05-20 MED ORDER — FENTANYL CITRATE (PF) 100 MCG/2ML IJ SOLN: 25.0000 ug | INTRAMUSCULAR | Status: DC | PRN

## 2018-05-20 MED ORDER — LEVOTHYROXINE SODIUM 100 MCG/5ML IV SOLN
50.0000 ug | Freq: Every day | INTRAVENOUS | Status: DC
  Administered 2018-05-21 – 2018-05-26 (×6): 50 ug via INTRAVENOUS
  Filled 2018-05-20 (×7): qty 5

## 2018-05-20 MED ORDER — ACETAMINOPHEN 325 MG PO TABS
650.0000 mg | ORAL_TABLET | Freq: Four times a day (QID) | ORAL | Status: DC | PRN
  Filled 2018-05-20: qty 2

## 2018-05-20 MED ORDER — ACETAMINOPHEN 650 MG RE SUPP
650.0000 mg | Freq: Four times a day (QID) | RECTAL | Status: DC | PRN
  Filled 2018-05-20: qty 1

## 2018-05-20 MED ORDER — SODIUM CHLORIDE 0.9 % IV SOLN
INTRAVENOUS | Status: DC | PRN
  Administered 2018-05-20: 25 ug/min via INTRAVENOUS

## 2018-05-20 MED ORDER — ROCURONIUM BROMIDE 10 MG/ML (PF) SYRINGE
PREFILLED_SYRINGE | INTRAVENOUS | Status: DC | PRN
  Administered 2018-05-20: 50 mg via INTRAVENOUS

## 2018-05-20 MED ORDER — PROPOFOL 10 MG/ML IV BOLUS
INTRAVENOUS | Status: DC | PRN
  Administered 2018-05-20: 100 mg via INTRAVENOUS

## 2018-05-20 MED ORDER — INSULIN ASPART 100 UNIT/ML ~~LOC~~ SOLN
SUBCUTANEOUS | Status: AC
  Filled 2018-05-20: qty 1

## 2018-05-20 MED ORDER — SODIUM CHLORIDE 0.9 % IV SOLN
INTRAVENOUS | Status: DC
  Administered 2018-05-20: 15:00:00 via INTRAVENOUS

## 2018-05-20 MED ORDER — LIDOCAINE 2% (20 MG/ML) 5 ML SYRINGE
INTRAMUSCULAR | Status: DC | PRN
  Administered 2018-05-20: 60 mg via INTRAVENOUS

## 2018-05-20 MED ORDER — SUGAMMADEX SODIUM 200 MG/2ML IV SOLN
INTRAVENOUS | Status: DC | PRN
  Administered 2018-05-20: 300 mg via INTRAVENOUS

## 2018-05-20 MED ORDER — ONDANSETRON HCL 4 MG/2ML IJ SOLN
4.0000 mg | Freq: Once | INTRAMUSCULAR | Status: AC
  Administered 2018-05-20: 4 mg via INTRAVENOUS
  Filled 2018-05-20: qty 2

## 2018-05-20 MED ORDER — SODIUM CHLORIDE 0.9 % IV SOLN: INTRAVENOUS | Status: DC

## 2018-05-20 MED ORDER — CEFAZOLIN SODIUM-DEXTROSE 2-3 GM-%(50ML) IV SOLR
INTRAVENOUS | Status: DC | PRN
  Administered 2018-05-20: 2 g via INTRAVENOUS

## 2018-05-20 MED ORDER — ONDANSETRON HCL 4 MG/2ML IJ SOLN: 4.0000 mg | Freq: Once | INTRAMUSCULAR | Status: DC | PRN

## 2018-05-20 MED ORDER — INSULIN ASPART 100 UNIT/ML ~~LOC~~ SOLN
0.0000 [IU] | Freq: Three times a day (TID) | SUBCUTANEOUS | Status: DC
  Administered 2018-05-21 (×2): 1 [IU] via SUBCUTANEOUS
  Administered 2018-05-22: 0 [IU] via SUBCUTANEOUS
  Administered 2018-05-22: 1 [IU] via SUBCUTANEOUS
  Administered 2018-05-23 (×2): 2 [IU] via SUBCUTANEOUS
  Administered 2018-05-24 – 2018-05-25 (×3): 1 [IU] via SUBCUTANEOUS
  Administered 2018-05-25 – 2018-05-26 (×3): 2 [IU] via SUBCUTANEOUS
  Administered 2018-05-26: 3 [IU] via SUBCUTANEOUS
  Administered 2018-05-27: 1 [IU] via SUBCUTANEOUS
  Administered 2018-05-27: 2 [IU] via SUBCUTANEOUS
  Administered 2018-05-27: 1 [IU] via SUBCUTANEOUS
  Filled 2018-05-20: qty 0.09

## 2018-05-20 MED ORDER — SODIUM CHLORIDE 0.9% FLUSH
3.0000 mL | Freq: Two times a day (BID) | INTRAVENOUS | Status: DC
  Administered 2018-05-20 – 2018-05-28 (×5): 3 mL via INTRAVENOUS

## 2018-05-20 MED ORDER — PHENYLEPHRINE HCL 10 MG/ML IJ SOLN
INTRAMUSCULAR | Status: DC | PRN
  Administered 2018-05-20: 120 ug via INTRAVENOUS
  Administered 2018-05-20 (×2): 80 ug via INTRAVENOUS

## 2018-05-20 MED ORDER — DEXAMETHASONE SODIUM PHOSPHATE 10 MG/ML IJ SOLN
INTRAMUSCULAR | Status: DC | PRN
  Administered 2018-05-20: 10 mg via INTRAVENOUS

## 2018-05-20 MED ORDER — SUCCINYLCHOLINE CHLORIDE 200 MG/10ML IV SOSY
PREFILLED_SYRINGE | INTRAVENOUS | Status: DC | PRN
  Administered 2018-05-20: 120 mg via INTRAVENOUS

## 2018-05-20 MED ORDER — ONDANSETRON HCL 4 MG/2ML IJ SOLN
INTRAMUSCULAR | Status: DC | PRN
  Administered 2018-05-20: 4 mg via INTRAVENOUS

## 2018-05-20 MED ORDER — PROPOFOL 10 MG/ML IV BOLUS
INTRAVENOUS | Status: AC
  Filled 2018-05-20: qty 20

## 2018-05-20 MED ORDER — SODIUM CHLORIDE 0.9 % IV SOLN
INTRAVENOUS | Status: DC
  Administered 2018-05-20 – 2018-05-22 (×5): via INTRAVENOUS

## 2018-05-20 MED ORDER — FENTANYL CITRATE (PF) 250 MCG/5ML IJ SOLN
INTRAMUSCULAR | Status: AC
  Filled 2018-05-20: qty 5

## 2018-05-20 SURGICAL SUPPLY — 14 items

## 2018-05-20 SURGICAL SUPPLY — 49 items
BAG URINE DRAINAGE (UROLOGICAL SUPPLIES) ×3 IMPLANT
BLADE CLIPPER SURG (BLADE) IMPLANT
CANISTER SUCT 3000ML PPV (MISCELLANEOUS) ×3 IMPLANT
CATH MALECOT BARD  24FR (CATHETERS) ×2
CATH MALECOT BARD 24FR (CATHETERS) ×1 IMPLANT
CHLORAPREP W/TINT 26ML (MISCELLANEOUS) ×3 IMPLANT
COVER SURGICAL LIGHT HANDLE (MISCELLANEOUS) ×3 IMPLANT
COVER WAND RF STERILE (DRAPES) IMPLANT
DRAPE LAPAROSCOPIC ABDOMINAL (DRAPES) ×3 IMPLANT
DRAPE WARM FLUID 44X44 (DRAPE) ×3 IMPLANT
DRSG OPSITE POSTOP 4X10 (GAUZE/BANDAGES/DRESSINGS) ×3 IMPLANT
DRSG OPSITE POSTOP 4X8 (GAUZE/BANDAGES/DRESSINGS) IMPLANT
ELECT BLADE 4.0 EZ CLEAN MEGAD (MISCELLANEOUS) ×3
ELECT BLADE 6.5 EXT (BLADE) IMPLANT
ELECT CAUTERY BLADE 6.4 (BLADE) ×3 IMPLANT
ELECT REM PT RETURN 9FT ADLT (ELECTROSURGICAL) ×3
ELECTRODE BLDE 4.0 EZ CLN MEGD (MISCELLANEOUS) ×1 IMPLANT
ELECTRODE REM PT RTRN 9FT ADLT (ELECTROSURGICAL) ×1 IMPLANT
GAUZE SPONGE 4X4 12PLY STRL LF (GAUZE/BANDAGES/DRESSINGS) ×3 IMPLANT
GLOVE BIO SURGEON STRL SZ8 (GLOVE) ×3 IMPLANT
GLOVE BIOGEL PI IND STRL 8 (GLOVE) ×1 IMPLANT
GLOVE BIOGEL PI INDICATOR 8 (GLOVE) ×2
GOWN STRL REUS W/ TWL LRG LVL3 (GOWN DISPOSABLE) ×1 IMPLANT
GOWN STRL REUS W/ TWL XL LVL3 (GOWN DISPOSABLE) ×1 IMPLANT
GOWN STRL REUS W/TWL LRG LVL3 (GOWN DISPOSABLE) ×2
GOWN STRL REUS W/TWL XL LVL3 (GOWN DISPOSABLE) ×2
HANDLE SUCTION POOLE (INSTRUMENTS) ×1 IMPLANT
KIT BASIN OR (CUSTOM PROCEDURE TRAY) ×3 IMPLANT
KIT TURNOVER KIT B (KITS) ×3 IMPLANT
LIGASURE IMPACT 36 18CM CVD LR (INSTRUMENTS) IMPLANT
NS IRRIG 1000ML POUR BTL (IV SOLUTION) ×9 IMPLANT
PACK GENERAL/GYN (CUSTOM PROCEDURE TRAY) ×3 IMPLANT
PAD ARMBOARD 7.5X6 YLW CONV (MISCELLANEOUS) ×3 IMPLANT
PENCIL SMOKE EVACUATOR (MISCELLANEOUS) ×3 IMPLANT
SPECIMEN JAR LARGE (MISCELLANEOUS) IMPLANT
SPONGE DRAIN TRACH 4X4 STRL 2S (GAUZE/BANDAGES/DRESSINGS) ×3 IMPLANT
SPONGE LAP 18X18 RF (DISPOSABLE) ×6 IMPLANT
STAPLER VISISTAT 35W (STAPLE) ×3 IMPLANT
SUCTION POOLE HANDLE (INSTRUMENTS) ×3
SUCTION POOLE TIP (SUCTIONS) ×3 IMPLANT
SUT PDS AB 1 TP1 96 (SUTURE) ×9 IMPLANT
SUT SILK 2 0 SH (SUTURE) ×9 IMPLANT
SUT SILK 2 0 SH CR/8 (SUTURE) IMPLANT
SUT SILK 2 0 TIES 10X30 (SUTURE) ×6 IMPLANT
SUT SILK 3 0 SH CR/8 (SUTURE) ×3 IMPLANT
SUT SILK 3 0 TIES 10X30 (SUTURE) ×3 IMPLANT
TOWEL OR 17X26 10 PK STRL BLUE (TOWEL DISPOSABLE) ×3 IMPLANT
TRAY FOLEY MTR SLVR 16FR STAT (SET/KITS/TRAYS/PACK) ×3 IMPLANT
YANKAUER SUCT BULB TIP NO VENT (SUCTIONS) ×3 IMPLANT

## 2018-05-20 NOTE — ED Notes (Signed)
Pt transferred to ENDO 

## 2018-05-20 NOTE — H&P (Signed)
History and Physical  Dana Saunders OXB:[id number] DOB: 06-Mar-1930 DOA: 05/20/2018  PCP: Renford Dills, MD   Chief Complaint: abd pain  HPI:  87yow PMH DM type 2, HTN, hyperlipidemia   PRESENTED with abd pain, cough, SOB. CT showed gastric volvulus. Seen by general surgery with plans for NGT, GI consultation for EGD with reduction, no need for emergent surgery. [facility] plans emergent EGD.  Symptoms began 3/21 with above, severe lower abdominal pain, vomiting; pain constant, no aggravating/alleviating factors noted. Otherwise has been well, not in a hospital for 50 or more years.   ED Course: as above  COVID SCREEN Fever: NO  Cough: YES, chronic   SOB: NO URI symptoms: NO GI symptoms: YES but not contributory Travel: NO Sick contacts: NO  Review of Systems:  Negative for fever, visual changes, sore throat, rash, new muscle aches, chest pain, SOB, dysuria, bleeding  Positive for chills  Past Medical History:  Diagnosis Date  . Diabetes mellitus without complication (HCC)   . Hyperlipemia   . Hypertension     History reviewed. No pertinent surgical history. No surgeries   reports that she has quit smoking. She has never used smokeless tobacco. She reports previous alcohol use. No history on file for drug. Mobility: cane in house, walker outside of house  No Known Allergies  Family History  Problem Relation Age of Onset  . Diabetes Mother   . Cancer Father      Prior to Admission medications   Medication Sig Start Date End Date Taking? Authorizing Provider  cetirizine (ZYRTEC) 10 MG tablet Take 10 mg by mouth daily.   Yes [provider]  hydrochlorothiazide (HYDRODIURIL) 12.5 MG tablet Take 12.5 mg by mouth every morning.   Yes [provider]  levothyroxine (SYNTHROID, LEVOTHROID) 88 MCG tablet Take 88 mcg by mouth every morning.   Yes [provider]  losartan (COZAAR) 50 MG tablet Take 50 mg by mouth daily.   Yes [provider]  metFORMIN (GLUCOPHAGE) 500 MG tablet Take 1,000 mg by mouth 2 (two) times daily.   Yes [provider]  pravastatin (PRAVACHOL) 40 MG tablet Take 40 mg by mouth daily.   Yes [provider]  sitaGLIPtin (JANUVIA) 50 MG tablet Take 50 mg by mouth daily.   Yes [provider]    Physical Exam: Vitals:   05/20/18 1615 05/20/18 1646  BP: (!) 184/91 (!) 193/80  Pulse: (!) 101 89  Resp:  16  Temp:  97.7 F (36.5 C)  SpO2: 92% 100%    Constitutional:   . Appears calm and comfortable Eyes:  . pupils and irises appear normal . Normal lids and conjunctivae ENMT:  . grossly normal hearing  . Lips appear normal Neck:  . neck appears normal, no masse . no thyromegaly Respiratory:  . CTA bilaterally, no w/r/r.  . Respiratory effort normal. Cardiovascular:  . RRR, no m/r/g . No LE extremity edema   Abdomen:  . Soft, ntnd currently; reports earlier pain was in low abdomen Musculoskeletal:  . Digits/nails BUE: no clubbing, cyanosis, petechiae, infection . RUE, LUE o strength and tone normal, no atrophy, no abnormal movements o No tenderness, masses Skin:  . No rashes, lesions, ulcers . palpation of skin: no induration or nodules Psychiatric:  . Mental status o Mood, affect appropriate . judgment and insight appear intact    I have personally reviewed following labs and imaging studies CMP noted Lipase up BMP noted  Medical tests:  EKG independently reviewed: ST no acute changes     Principal Problem:   Gastric volvulus Active Problems:   Hyponatremia   Elevated lipase   DM type 2 (diabetes mellitus, type 2) (HCC)   Hyperlipidemia   Benign essential HTN   Assessment/Plan Gastric volvulus per CT with abd pain. CXR independent review clear lungs, large amt air under left hemidiaphragm --management as per surgery and GI --EGD planned  Hyponatremia, minimal elevation AG  --secondary to dehydration. Treat with IVF  --check lactic acid but doesn't appear toxic  Elevated lipase --nonspecific here, probably from primary issue --follow clinically  DM type 2 --hyperglycemic; I do not suspect DKA at this point. AG probably dehydration, acute illness --SSI  Hyperlipidemia --resume pravastatin when taking PO  Essential HTN --resume HCTZ when taking PO  Allergies --Zyrtec or substitute   COVID likelihood: low Physician PPE: surgical mask Patient PPE: none   COVID Testing: not indicated per current ID/[place] guidelines   Severity of Illness: The appropriate patient status for this patient is INPATIENT. Inpatient status is judged to be reasonable and necessary in order to provide the required intensity of service to ensure the patient's safety. The patient's presenting symptoms, physical exam findings, and initial radiographic and laboratory data in the context of their chronic comorbidities is felt to place them at high risk for further clinical deterioration. Furthermore, it is not anticipated that the patient will be medically stable for discharge from the hospital within 2 midnights of admission. The following factors support the patient status of inpatient.   " The patient's presenting symptoms include abd pain. " The worrisome physical exam findings include benign but has NGT. " The initial radiographic and laboratory data are worrisome because of gastric volvulus. " The chronic co-morbidities include DM type 2.   * I certify that at the point of admission it is my clinical judgment that the patient will require inpatient hospital care spanning beyond 2 midnights from the point of admission due to high intensity of service, high risk for further deterioration and high frequency of surveillance required.*     DVT prophylaxis: SCDs Code Status: Full Family Communication: daughter at bedside Consults called: EDP called general surgery and [facility] DR Marca Ancona    Time spent: 50 minutes   Brendia Sacks, MD  [facility] Direct contact: see [email or web address]  7PM-7AM contact night coverage as below   1. Check the care team in [facility] and look for a) attending/consulting [facility] provider listed and b) the [facility] team listed 2. Log into [email or web address] and use [place]'s universal password to access. If you do not have the password, please contact the hospital operator. 3. Locate the [facility] provider you are looking for under [facility] and page to a number that you can be directly reached. 4. If you still have difficulty reaching the provider, please page the [facility] (Director on Call) for the Hospitalists listed on amion for assistance.   05/20/2018, 5:19 PM

## 2018-05-20 NOTE — Op Note (Signed)
[facility] Patient Name: Dana Saunders Procedure Date : 05/20/2018 MRN: [id number] Attending MD: Kerin Salen , MD Date of Birth: 07/09/1930 CSN: [id number] Age: 85 Admit Type: Outpatient Procedure:                Upper GI endoscopy Indications:              Therapeutic procedure, Abnormal CT of the GI tract                            showing gastric volvulus Providers:                Kerin Salen, MD, Bonney Leitz, Denice Bors., Technician, Dairl Ponder, CRNA Referring MD:              Medicines:                Monitored Anesthesia Care Complications:            No immediate complications. Estimated Blood Loss:     Estimated blood loss: none. Procedure:                Pre-Anesthesia Assessment:                           - Prior to the procedure, a History and Physical                            was performed, and patient medications and                            allergies were reviewed. The patient's tolerance of                            previous anesthesia was also reviewed. The risks                            and benefits of the procedure and the sedation                            options and risks were discussed with the patient.                            All questions were answered, and informed consent                            was obtained. Prior Anticoagulants: The patient has                            taken no previous anticoagulant or antiplatelet                            agents. ASA Grade Assessment: III - A patient with  severe systemic disease. After reviewing the risks                            and benefits, the patient was deemed in                            satisfactory condition to undergo the procedure.                           After obtaining informed consent, the endoscope was                            passed under direct vision. Throughout the   procedure, the patient's blood pressure, pulse, and                            oxygen saturations were monitored continuously. The                            GIF-H190 ([id number] Olympus gastroscope was                            introduced through the mouth, and advanced to the                            incisura of the stomach. The upper GI endoscopy was                            accomplished without difficulty. The patient                            tolerated the procedure well. Scope In: Scope Out: Findings:      The examined esophagus was normal. The NG tube was noted in the gastric       cavity.      A large amount of food (residue) was found in the cardia, in the gastric       fundus, in the gastric body, on the greater curvature of the stomach, in       the gastric antrum and at the pylorus.      1200 ml of liquid and semiliquid residual food was suctioned.      The stomach was massively distended.      An area of narrowing was noted in the distal gastric body.      Due to massive distension and "twisting of the gastric cavity" despite       suctioning and abdominal pressure, the gastroscope could not be advanced       in the distal gastric cavity or duodenum.      Localized severe mucosal changes characterized by necrotic appearing       mucosa (black, pigmented, erythematous) were found in the gastric body.      The scope would easily retroflex instead of advancing in to the distal       gastric cavity due to "twisting of the distal gastric cavity". Impression:               - Normal esophagus.                           -  A large amount of food (residue) in the stomach.                           - Necrotic appearing(black, pigmented,                            erythematous) mucosa in the gastric body.                           - No specimens collected.                           - Changes of gastric volvulus with necrotic gastric                            mucosa. Moderate  Sedation:      Patient did not receive moderate sedation for this procedure, but       instead received monitored anesthesia care. Recommendation:           - NPO. Keep NG tube to intermittent suction.                           - Refer to a surgeon today. Discussed with on call                            surgeon Dr.Thompson, patient to be taken to OR for                            decompression and possible gastropexy. Procedure Code(s):        --- Professional ---                           [phone], 52, Esophagogastroduodenoscopy, flexible,                            transoral; diagnostic, including collection of                            specimen(s) by brushing or washing, when performed                            (separate procedure) Diagnosis Code(s):        --- Professional ---                           R93.3, Abnormal findings on diagnostic imaging of                            other parts of digestive tract CPT copyright 2018 American [employer] Association. All rights reserved. The codes documented in this report are preliminary and upon coder review may  be revised to meet current compliance requirements. Kerin Salen, MD 05/20/2018 6:19:05 PM This report has been signed electronically. Number of Addenda: 0

## 2018-05-20 NOTE — Consult Note (Signed)
Reason for Consult:gastric volvulus Referring Physician: Tarron Olsson is an 85 y.o. female.  HPI: 85yo F with history of hypertension and diabetes developed epigastric abdominal pain associated with nausea last night.  No vomiting.  The discomfort in her epigastric area persisted and she came to the emergency department for evaluation.  Laboratory studies showed mild hyponatremia and white blood cell count 11,900.  CT scan of the abdomen and pelvis was done showing gastric volvulus without evidence of gastric compromise.  I was asked to see her from a surgical standpoint.  Past Medical History:  Diagnosis Date  . Diabetes mellitus without complication (HCC)   . Hyperlipemia   . Hypertension     History reviewed. No pertinent surgical history.  History reviewed. No pertinent family history.  Social History:  reports that she has quit smoking. She has never used smokeless tobacco. No history on file for alcohol and drug.  Allergies: No Known Allergies  Medications: I have reviewed the patient's current medications.  Results for orders placed or performed during the hospital encounter of 05/20/18 (from the past 48 hour(s))  Comprehensive metabolic panel     Status: Abnormal   Collection Time: 05/20/18 12:22 PM  Result Value Ref Range   Sodium 130 (L) 135 - 145 mmol/L   Potassium 3.7 3.5 - 5.1 mmol/L   Chloride 92 (L) 98 - 111 mmol/L   CO2 21 (L) 22 - 32 mmol/L   Glucose, Bld 304 (H) 70 - 99 mg/dL   BUN 11 8 - 23 mg/dL   Creatinine, Ser 6.43 0.44 - 1.00 mg/dL   Calcium 9.6 8.9 - 32.9 mg/dL   Total Protein 9.5 (H) 6.5 - 8.1 g/dL   Albumin 4.7 3.5 - 5.0 g/dL   AST 25 15 - 41 U/L   ALT 11 0 - 44 U/L   Alkaline Phosphatase 58 38 - 126 U/L   Total Bilirubin 0.8 0.3 - 1.2 mg/dL   GFR calc non Af Amer >60 >60 mL/min    Comment: CORRECTED ON 03/22 AT 1316: PREVIOUSLY REPORTED AS NOT CALCULATED   GFR calc Af Amer >60 >60 mL/min    Comment: CORRECTED ON 03/22 AT 1316:  PREVIOUSLY REPORTED AS NOT CALCULATED   Anion gap 17 (H) 5 - 15    Comment: Performed at [facility], [address], [place], [place] [address]  CBC with Differential     Status: Abnormal   Collection Time: 05/20/18 12:22 PM  Result Value Ref Range   WBC 11.9 (H) 4.0 - 10.5 K/uL   RBC 5.09 3.87 - 5.11 MIL/uL   Hemoglobin 14.5 12.0 - 15.0 g/dL   HCT 16.6 06.3 - 01.6 %   MCV 78.6 (L) 80.0 - 100.0 fL   MCH 28.5 26.0 - 34.0 pg   MCHC 36.3 (H) 30.0 - 36.0 g/dL   RDW 01.0 93.2 - 35.5 %   Platelets 216 150 - 400 K/uL   nRBC 0.0 0.0 - 0.2 %   Neutrophils Relative % 87 %   Neutro Abs 10.4 (H) 1.7 - 7.7 K/uL   Lymphocytes Relative 6 %   Lymphs Abs 0.7 0.7 - 4.0 K/uL   Monocytes Relative 6 %   Monocytes Absolute 0.7 0.1 - 1.0 K/uL   Eosinophils Relative 0 %   Eosinophils Absolute 0.0 0.0 - 0.5 K/uL   Basophils Relative 0 %   Basophils Absolute 0.0 0.0 - 0.1 K/uL   Immature Granulocytes 1 %   Abs  Immature Granulocytes 0.08 (H) 0.00 - 0.07 K/uL    Comment: Performed at [facility], [address], [place], [place] [address]  Lipase, blood     Status: Abnormal   Collection Time: 05/20/18 12:22 PM  Result Value Ref Range   Lipase 153 (H) 11 - 51 U/L    Comment: Performed at [facility], [address] [place], [place] [address]    Dg Chest 2 View  Result Date: 05/20/2018 CLINICAL DATA:  Abdominal pain, cough and shortness of breath since yesterday. EXAM: CHEST - 2 VIEW COMPARISON:  Chest x-ray dated 10/12/2017. FINDINGS: Heart size and mediastinal contours are within normal limits. Lungs are clear. No pleural effusion or pneumothorax seen. Large amount of air under the LEFT hemidiaphragm. This is most likely within stomach or colon, with associated air-fluid level. Similar appearance demonstrated on earlier chest x-rays of 10/12/2017 and 06/06/2012. Osseous structures about the chest are unremarkable. IMPRESSION: 1. No active cardiopulmonary disease. No evidence of  pneumonia or pulmonary edema. 2. Large amount of air under the LEFT hemidiaphragm, most likely air within distended stomach or colon with associated air-fluid level. Given the history of abdominal pain, would consider CT abdomen and pelvis to exclude associated bowel obstruction and to exclude the less likely possibility of free intraperitoneal air. Electronically Signed   By: Bary Richard M.D.   On: 05/20/2018 13:08   Ct Abdomen Pelvis W Contrast  Result Date: 05/20/2018 CLINICAL DATA:  Abdominal pain, cough and shortness of breath beginning yesterday. EXAM: CT ABDOMEN AND PELVIS WITH CONTRAST TECHNIQUE: Multidetector CT imaging of the abdomen and pelvis was performed using the standard protocol following bolus administration of intravenous contrast. CONTRAST:  100 mL OMNIPAQUE IOHEXOL 300 MG/ML  SOLN COMPARISON:  None. FINDINGS: Lower chest: Lung bases are clear. No pleural or pericardial effusion. Hepatobiliary: No focal liver abnormality is seen. No gallstones, gallbladder wall thickening, or biliary dilatation. Pancreas: Unremarkable. No pancreatic ductal dilatation or surrounding inflammatory changes. Spleen: Normal in size without focal abnormality. Adrenals/Urinary Tract: Adrenal glands are unremarkable. Kidneys are normal, without renal calculi, solid lesion, or hydronephrosis. Small left renal cyst noted. Bladder is unremarkable. Stomach/Bowel: The stomach is massively distended. The first portion of the duodenum and pylorus are displaced superiorly and posteriorly anterior to the aorta highly suspicious for gastric volvulus. The remainder of the small bowel is unremarkable. The colon is largely decompressed. Vascular/Lymphatic: Aortic atherosclerosis. No enlarged abdominal or pelvic lymph nodes. Reproductive: Calcified uterine fibroids noted. Other: No free intraperitoneal air. Musculoskeletal: No acute or focal bony abnormality. Convex right lumbar scoliosis. Bilateral hip osteoarthritis is severe  on the right. There also degenerative disease about the sacroiliac joints. IMPRESSION: Findings highly suspicious for gastric volvulus. Atherosclerosis. Critical Value/emergent results were called by telephone at the time of interpretation on 05/20/2018 at 2:48 pm to Dr. Gerhard Munch , who verbally acknowledged these results. Electronically Signed   By: Drusilla Kanner M.D.   On: 05/20/2018 14:48    Review of Systems  Constitutional: Negative for chills and fever.  HENT: Negative.   Eyes: Negative.   Respiratory: Positive for cough and sputum production.   Cardiovascular: Negative for chest pain.  Gastrointestinal: Positive for abdominal pain and nausea. Negative for diarrhea and vomiting.  Genitourinary: Negative.   Skin: Negative.   Neurological: Negative.   Endo/Heme/Allergies: Negative.   Psychiatric/Behavioral: Negative.    Blood pressure (!) 179/89, pulse 96, temperature 97.7 F (36.5 C), temperature source Oral, resp. rate (!) 22, height  5\' 3"  (1.6 m), weight 65.8 kg, SpO2 94 %. Physical Exam  Constitutional: She is oriented to person, place, and time. She appears well-developed and well-nourished.  HENT:  Head: Normocephalic.  Mouth/Throat: Oropharynx is clear and moist.  Eyes: Pupils are equal, round, and reactive to light. EOM are normal.  Neck: Neck supple. No tracheal deviation present. No thyromegaly present.  Cardiovascular: Normal rate, regular rhythm and normal heart sounds.  Respiratory: Effort normal and breath sounds normal. No respiratory distress. She has no wheezes. She has no rales.  GI: Soft. She exhibits distension. There is abdominal tenderness. There is no rebound and no guarding.  Mild upper abdominal distention with mild tenderness, no peritonitis, no generalized tenderness  Musculoskeletal: Normal range of motion.        General: No edema.  Neurological: She is alert and oriented to person, place, and time.  Skin: Skin is warm.  Psychiatric: She has a  normal mood and affect.    Assessment/Plan: Gastric volvulus - recommend NG tube to low intermittent suction, GI consultation for upper endoscopy with reduction.  No need for emergent surgery.  We will follow along.  Sometimes these patients require gastropexy to prevent recurrence.  This can also be accomplished with endoscopic PEG tube placement.  HTN and DM - per admitting hospitalist team  I discussed with Dr. Jeraldine LootsLockwood.  Liz MaladyBurke E Colletta Spillers 05/20/2018, 3:24 PM

## 2018-05-20 NOTE — Anesthesia Procedure Notes (Signed)
Procedure Name: Intubation Date/Time: 05/20/2018 5:39 PM Performed by: Clearnce Sorrel, CRNA Pre-anesthesia Checklist: Patient identified, Emergency Drugs available, Suction available, Patient being monitored and Timeout performed Patient Re-evaluated:Patient Re-evaluated prior to induction Oxygen Delivery Method: Circle system utilized Preoxygenation: Pre-oxygenation with 100% oxygen Induction Type: IV induction, Rapid sequence and Cricoid Pressure applied Laryngoscope Size: Mac and 3 Grade View: Grade I Tube type: Oral Tube size: 7.0 mm Number of attempts: 1 Airway Equipment and Method: Stylet Placement Confirmation: ETT inserted through vocal cords under direct vision,  positive ETCO2 and breath sounds checked- equal and bilateral Secured at: 21 cm Tube secured with: Tape Dental Injury: Teeth and Oropharynx as per pre-operative assessment

## 2018-05-20 NOTE — Progress Notes (Signed)
Received call from lab noted lactic acid--2.2, text page on call doctor Bodenheimer with no new order.

## 2018-05-20 NOTE — Progress Notes (Signed)
Pt arrived to floor from PACU. Pt alert and oriented x3. Denies pain. Oriented to room and call bell.

## 2018-05-20 NOTE — Progress Notes (Signed)
Patient ID: Dana Saunders, female   DOB: 09/26/1930, 85 y.o.   MRN: [id number] Dr. Marca Ancona was not able to reduce the gastric volvulus and the mucosa had some ischemic changes. Will precede wit hemergency ex lap and G tube. I spoke with her POA by phone and got consent.  Violeta Gelinas, MD, MPH, FACS Trauma: [phone] General Surgery: [phone]

## 2018-05-20 NOTE — Brief Op Note (Signed)
05/20/2018  6:19 PM  PATIENT:  Dana Saunders  85 y.o. female  PRE-OPERATIVE DIAGNOSIS:  Gastric volvulus  POST-OPERATIVE DIAGNOSIS:  * No post-op diagnosis entered *  PROCEDURE:  Procedure(s): ESOPHAGOGASTRODUODENOSCOPY (EGD) WITH PROPOFOL (N/A)  SURGEON:  Surgeon(s) and Role:    Ronnette Juniper, MD - Primary  PHYSICIAN ASSISTANT:   ASSISTANTS: Angus Seller, RN, Elspeth Cho, Tech   ANESTHESIA:   MAC  EBL: None  BLOOD ADMINISTERED:none  DRAINS: none   LOCAL MEDICATIONS USED:  NONE  SPECIMEN:  No Specimen  DISPOSITION OF SPECIMEN:  N/A  COUNTS:  YES  TOURNIQUET:  * No tourniquets in log *  DICTATION: .Dragon Dictation  PLAN OF CARE: Admit to inpatient   PATIENT DISPOSITION:  PACU - hemodynamically stable.   Delay start of Pharmacological VTE agent (>24hrs) due to surgical blood loss or risk of bleeding: yes

## 2018-05-20 NOTE — Progress Notes (Signed)
Discussed with surgeon on call. Patient to be taken to OR for decompression and possible gastropexy. Discussed with patient's niece over the phone.  Kerin Salen, MD

## 2018-05-20 NOTE — Op Note (Signed)
05/20/2018  7:41 PM  PATIENT:  Dana Saunders  85 y.o. female  PRE-OPERATIVE DIAGNOSIS:  Gastric volvulus  POST-OPERATIVE DIAGNOSIS:  Gastric volvulus  PROCEDURE:  Procedure(s): EXPLORATORY LAPAROTOMY REDUCTION OF GASTRIC VOLVULUS GASTROSTOMY TUBE PLACEMENT 24FR MALECOT  SURGEON:  Surgeon(s): Violeta Gelinas, MD  ASSISTANTS: none   ANESTHESIA:   general  EBL:  Total I/O In: 900 [I.V.:900] Out: 725 [Urine:700; Blood:25]  BLOOD ADMINISTERED:none  DRAINS: Gastrostomy Tube   SPECIMEN:  No Specimen  DISPOSITION OF SPECIMEN:  N/A  COUNTS:  YES  DICTATION: .Dragon Dictation Findings: Gastric volvulus reduced easily, stomach viable  Procedure in detail: Patient was admitted with a gastric volvulus.  I saw her in consultation.  She was taken to endoscopy by Dr. Marca Ancona.  She underwent EGD but they were unable to reduce the gastric volvulus with the scope.  There was some signs of gastric mucosal ischemia so she was immediately brought to the operating room for exploratory laparotomy.  I got informed consent from her healthcare power of attorney.  She received intravenous antibiotics.  General anesthesia was continued by the anesthesia team.  Foley catheter was placed by nursing.  Her abdomen was prepped and draped in a sterile fashion.  We did a timeout procedure.  I did an upper midline incision and subcutaneous tissues were dissected down to the fascia.  This was divided along the midline and the peritoneal cavity was entered.  There was no significant hemoperitoneum or other fluid in the abdomen.  I easily reduced the gastric volvulus in the stomach was inspected.  It appeared viable.  There was a small blistered area near where it had twisted.  This was no perforation.  I did oversew that with 3-0 silk sutures.  Next I placed a gastrostomy tube in order to pexied the stomach and prevent recurrence.  The stomach laid nicely flat without any twists.  I placed 2 concentric 2-0 silk  pursestring sutures in the gastric body.  I then made a small gastrotomy.  I inserted a 24 [place] Malecot tube.  This was brought out through the left upper quadrant.  The silk sutures were sequentially tied and the stomach was brought up to the anterior abdominal wall adjusting the tube.  The 2-0 silk sutures were used to tack the stomach up to the anterior abdominal wall as well.  Next, multiple 2-0 silk sutures were used to tack the stomach around the G-tube up to the abdominal wall on the inside.  The tube was then flushed and it flushed well without leakage and gastric contents returned.  The tube was secured to the skin with nylon suture.  The stomach was reinspected and remained viable and was laying in a nice natural position.  The abdomen was copiously irrigated.  I changed my gloves.  Fascia was closed with a running #1 looped PDS and the skin was closed with staples.  I placed an additional 2-0 silk suture on the skin for hemostasis.  A sterile dressing was applied.  All counts were correct.  She tolerated the procedure well without apparent complication and was taken recovery in stable condition. PATIENT DISPOSITION:  PACU - hemodynamically stable.   Delay start of Pharmacological VTE agent (>24hrs) due to surgical blood loss or risk of bleeding:  no  Violeta Gelinas, MD, MPH, FACS Pager: [phone]  3/22/20207:41 PM

## 2018-05-20 NOTE — ED Notes (Signed)
ED TO INPATIENT HANDOFF REPORT  ED Nurse Name and Phone #: [phone]  S Name/Age/Gender Dana Saunders 85 y.o. female Room/Bed: 029C/029C  Code Status   Code Status: Not on file  Home/SNF/Other Home Patient oriented to: self, place, time and situation Is this baseline? Yes   Triage Complete: Triage complete  Chief Complaint Gastric Volvulus   Triage Note Pt arrives to ED from home with complaints of abdominal pain, cough, and SOB since yesterday 3/21. Pt states shes been feeling more weak over this week but denies any chills or fevers at home.    Allergies No Known Allergies  Level of Care/Admitting Diagnosis ED Disposition    ED Disposition Condition Comment   Admit  Hospital Area: [facility] [[id number]]  Level of Care: Med-Surg [16]  Diagnosis: Gastric volvulus [563149]  Admitting Physician: Standley Brooking [[id number]]  Attending Physician: Standley Brooking [[id number]]  Estimated length of stay: past midnight tomorrow  Certification:: I certify this patient will need inpatient services for at least 2 midnights  PT Class (Do Not Modify): Inpatient [101]  PT Acc Code (Do Not Modify): Private [1]       B Medical/Surgery History Past Medical History:  Diagnosis Date  . Diabetes mellitus without complication (HCC)   . Hyperlipemia   . Hypertension    History reviewed. No pertinent surgical history.   A IV Location/Drains/Wounds Patient Lines/Drains/Airways Status   Active Line/Drains/Airways    Name:   Placement date:   Placement time:   Site:   Days:   Peripheral IV 05/20/18 Left Antecubital   05/20/18    1222    Antecubital   less than 1   NG/OG Tube Nasogastric 16 Fr. Right nare Xray;Aucultation Documented cm marking at nare/ corner of mouth   05/20/18    1538    Right nare   less than 1          Intake/Output Last 24 hours No intake or output data in the 24 hours ending 05/20/18 1618  Labs/Imaging Results for orders placed or  performed during the hospital encounter of 05/20/18 (from the past 48 hour(s))  Comprehensive metabolic panel     Status: Abnormal   Collection Time: 05/20/18 12:22 PM  Result Value Ref Range   Sodium 130 (L) 135 - 145 mmol/L   Potassium 3.7 3.5 - 5.1 mmol/L   Chloride 92 (L) 98 - 111 mmol/L   CO2 21 (L) 22 - 32 mmol/L   Glucose, Bld 304 (H) 70 - 99 mg/dL   BUN 11 8 - 23 mg/dL   Creatinine, Ser 7.02 0.44 - 1.00 mg/dL   Calcium 9.6 8.9 - 63.7 mg/dL   Total Protein 9.5 (H) 6.5 - 8.1 g/dL   Albumin 4.7 3.5 - 5.0 g/dL   AST 25 15 - 41 U/L   ALT 11 0 - 44 U/L   Alkaline Phosphatase 58 38 - 126 U/L   Total Bilirubin 0.8 0.3 - 1.2 mg/dL   GFR calc non Af Amer >60 >60 mL/min    Comment: CORRECTED ON 03/22 AT 1316: PREVIOUSLY REPORTED AS NOT CALCULATED   GFR calc Af Amer >60 >60 mL/min    Comment: CORRECTED ON 03/22 AT 1316: PREVIOUSLY REPORTED AS NOT CALCULATED   Anion gap 17 (H) 5 - 15    Comment: Performed at [facility], [address] [place], [place] [address]  CBC with Differential     Status: Abnormal   Collection Time: 05/20/18 12:22  PM  Result Value Ref Range   WBC 11.9 (H) 4.0 - 10.5 K/uL   RBC 5.09 3.87 - 5.11 MIL/uL   Hemoglobin 14.5 12.0 - 15.0 g/dL   HCT 03.8 88.2 - 80.0 %   MCV 78.6 (L) 80.0 - 100.0 fL   MCH 28.5 26.0 - 34.0 pg   MCHC 36.3 (H) 30.0 - 36.0 g/dL   RDW 34.9 17.9 - 15.0 %   Platelets 216 150 - 400 K/uL   nRBC 0.0 0.0 - 0.2 %   Neutrophils Relative % 87 %   Neutro Abs 10.4 (H) 1.7 - 7.7 K/uL   Lymphocytes Relative 6 %   Lymphs Abs 0.7 0.7 - 4.0 K/uL   Monocytes Relative 6 %   Monocytes Absolute 0.7 0.1 - 1.0 K/uL   Eosinophils Relative 0 %   Eosinophils Absolute 0.0 0.0 - 0.5 K/uL   Basophils Relative 0 %   Basophils Absolute 0.0 0.0 - 0.1 K/uL   Immature Granulocytes 1 %   Abs Immature Granulocytes 0.08 (H) 0.00 - 0.07 K/uL    Comment: Performed at [facility], [address] [place], [place] [address]  Lipase, blood     Status:  Abnormal   Collection Time: 05/20/18 12:22 PM  Result Value Ref Range   Lipase 153 (H) 11 - 51 U/L    Comment: Performed at [facility], [address] [place], [place] [address]   Dg Chest 2 View  Result Date: 05/20/2018 CLINICAL DATA:  Abdominal pain, cough and shortness of breath since yesterday. EXAM: CHEST - 2 VIEW COMPARISON:  Chest x-ray dated 10/12/2017. FINDINGS: Heart size and mediastinal contours are within normal limits. Lungs are clear. No pleural effusion or pneumothorax seen. Large amount of air under the LEFT hemidiaphragm. This is most likely within stomach or colon, with associated air-fluid level. Similar appearance demonstrated on earlier chest x-rays of 10/12/2017 and 06/06/2012. Osseous structures about the chest are unremarkable. IMPRESSION: 1. No active cardiopulmonary disease. No evidence of pneumonia or pulmonary edema. 2. Large amount of air under the LEFT hemidiaphragm, most likely air within distended stomach or colon with associated air-fluid level. Given the history of abdominal pain, would consider CT abdomen and pelvis to exclude associated bowel obstruction and to exclude the less likely possibility of free intraperitoneal air. Electronically Signed   By: Bary Richard M.D.   On: 05/20/2018 13:08   Ct Abdomen Pelvis W Contrast  Result Date: 05/20/2018 CLINICAL DATA:  Abdominal pain, cough and shortness of breath beginning yesterday. EXAM: CT ABDOMEN AND PELVIS WITH CONTRAST TECHNIQUE: Multidetector CT imaging of the abdomen and pelvis was performed using the standard protocol following bolus administration of intravenous contrast. CONTRAST:  100 mL OMNIPAQUE IOHEXOL 300 MG/ML  SOLN COMPARISON:  None. FINDINGS: Lower chest: Lung bases are clear. No pleural or pericardial effusion. Hepatobiliary: No focal liver abnormality is seen. No gallstones, gallbladder wall thickening, or biliary dilatation. Pancreas: Unremarkable. No pancreatic ductal dilatation or  surrounding inflammatory changes. Spleen: Normal in size without focal abnormality. Adrenals/Urinary Tract: Adrenal glands are unremarkable. Kidneys are normal, without renal calculi, solid lesion, or hydronephrosis. Small left renal cyst noted. Bladder is unremarkable. Stomach/Bowel: The stomach is massively distended. The first portion of the duodenum and pylorus are displaced superiorly and posteriorly anterior to the aorta highly suspicious for gastric volvulus. The remainder of the small bowel is unremarkable. The colon is largely decompressed. Vascular/Lymphatic: Aortic atherosclerosis. No enlarged abdominal or pelvic lymph nodes. Reproductive: Calcified  uterine fibroids noted. Other: No free intraperitoneal air. Musculoskeletal: No acute or focal bony abnormality. Convex right lumbar scoliosis. Bilateral hip osteoarthritis is severe on the right. There also degenerative disease about the sacroiliac joints. IMPRESSION: Findings highly suspicious for gastric volvulus. Atherosclerosis. Critical Value/emergent results were called by telephone at the time of interpretation on 05/20/2018 at 2:48 pm to Dr. Gerhard Munch , who verbally acknowledged these results. Electronically Signed   By: Drusilla Kanner M.D.   On: 05/20/2018 14:48   Dg Abd Portable 1 View  Result Date: 05/20/2018 CLINICAL DATA:  Status post NG tube placement. EXAM: PORTABLE ABDOMEN - 1 VIEW COMPARISON:  None. FINDINGS: NG tube is in place with the tip in the stomach. Side-port is at the gastroesophageal junction. Recommend advancement of 3-4 cm. IMPRESSION: As above. Electronically Signed   By: Drusilla Kanner M.D.   On: 05/20/2018 16:01    Pending Labs Unresulted Labs (From admission, onward)   None      Vitals/Pain Today's Vitals   05/20/18 1445 05/20/18 1500 05/20/18 1503 05/20/18 1615  BP:   (!) 179/89 (!) 184/91  Pulse: 91 95 96 (!) 101  Resp:      Temp:      TempSrc:      SpO2: 94% 94% 94% 92%  Weight:      Height:       PainSc:        Isolation Precautions No active isolations  Medications Medications  0.9 %  sodium chloride infusion ( Intravenous New Bag/Given 05/20/18 1434)  levothyroxine (SYNTHROID, LEVOTHROID) injection 50 mcg (has no administration in time range)  ondansetron (ZOFRAN) injection 4 mg (4 mg Intravenous Given 05/20/18 1435)  iohexol (OMNIPAQUE) 300 MG/ML solution 100 mL (100 mLs Intravenous Contrast Given 05/20/18 1407)    Mobility walks Low fall risk      R Recommendations: See Admitting Provider Note  Report given to:   Additional Notes: NG tube placed. Will need Endo.

## 2018-05-20 NOTE — ED Notes (Signed)
Pt states shes been constipated since Friday and had diarrhea last night.

## 2018-05-20 NOTE — ED Provider Notes (Signed)
[facility] EMERGENCY DEPARTMENT Provider Note   CSN: [id number] Arrival date & time: 05/20/18  1208    History   Chief Complaint Chief Complaint  Patient presents with   Abdominal Pain   Cough    HPI Dana Saunders is a 85 y.o. female.     HPI Presents with her niece who assists with the HPI. Patient notes that over the past few days she has had abdominal pain, cough. Onset is unclear, but seems that the symptoms have progressed over the past day. Pain is focally midline mid abdomen, with some mild superior radiation, though this is unclear. Pain is sore, moderate, not improved with OTC medication. Though she does have cough, she has no dyspnea, she has no chest pain. No fever, but she does complain of chills. She notes new onset of loose stool as well. She denies vomiting, however.   Past Medical History:  Diagnosis Date   Diabetes mellitus without complication (HCC)    Hyperlipemia    Hypertension     Patient Active Problem List   Diagnosis Date Noted   Gastric volvulus 05/20/2018    History reviewed. No pertinent surgical history.   OB History   No obstetric history on file.      Home Medications    Prior to Admission medications   Medication Sig Start Date End Date Taking? Authorizing Provider  cetirizine (ZYRTEC) 10 MG tablet Take 10 mg by mouth daily.   Yes [provider]  hydrochlorothiazide (HYDRODIURIL) 12.5 MG tablet Take 12.5 mg by mouth every morning.   Yes [provider]  levothyroxine (SYNTHROID, LEVOTHROID) 88 MCG tablet Take 88 mcg by mouth every morning.   Yes [provider]  losartan (COZAAR) 50 MG tablet Take 50 mg by mouth daily.   Yes [provider]  metFORMIN (GLUCOPHAGE) 500 MG tablet Take 1,000 mg by mouth 2 (two) times daily.   Yes [provider]  pravastatin (PRAVACHOL) 40 MG tablet Take 40 mg by mouth daily.   Yes [provider]  sitaGLIPtin  (JANUVIA) 50 MG tablet Take 50 mg by mouth daily.   Yes [provider]    Family History History reviewed. No pertinent family history.  Social History Social History   Tobacco Use   Smoking status: Former Smoker   Smokeless tobacco: Never Used  Substance Use Topics   Alcohol use: Not on file   Drug use: Not on file     Allergies   Patient has no known allergies.   Review of Systems Review of Systems  Constitutional:       Per HPI, otherwise negative  HENT:       Per HPI, otherwise negative  Respiratory:       Per HPI, otherwise negative  Cardiovascular:       Per HPI, otherwise negative  Gastrointestinal: Positive for abdominal pain, diarrhea and nausea. Negative for vomiting.  Endocrine:       Negative aside from HPI  Genitourinary:       Neg aside from HPI   Musculoskeletal:       Per HPI, otherwise negative  Skin: Negative.   Neurological: Negative for syncope.     Physical Exam Updated Vital Signs BP (!) 179/89    Pulse 96    Temp 97.7 F (36.5 C) (Oral)    Resp (!) 22    Ht 5\' 3"  (1.6 m)    Wt 65.8 kg    SpO2 94%  BMI 25.69 kg/m   Physical Exam Vitals signs and nursing note reviewed.  Constitutional:      General: She is not in acute distress.    Appearance: She is well-developed.  HENT:     Head: Normocephalic and atraumatic.  Eyes:     Conjunctiva/sclera: Conjunctivae normal.  Cardiovascular:     Rate and Rhythm: Normal rate and regular rhythm.  Pulmonary:     Effort: Pulmonary effort is normal. No respiratory distress.     Breath sounds: Normal breath sounds. No stridor.  Abdominal:     General: There is no distension.     Tenderness: There is abdominal tenderness in the periumbilical area and left upper quadrant.  Skin:    General: Skin is warm and dry.  Neurological:     Mental Status: She is alert and oriented to person, place, and time.     Cranial Nerves: No cranial nerve deficit.      ED Treatments / Results    Labs (all labs ordered are listed, but only abnormal results are displayed) Labs Reviewed  COMPREHENSIVE METABOLIC PANEL - Abnormal; Notable for the following components:      Result Value   Sodium 130 (*)    Chloride 92 (*)    CO2 21 (*)    Glucose, Bld 304 (*)    Total Protein 9.5 (*)    Anion gap 17 (*)    All other components within normal limits  CBC WITH DIFFERENTIAL/PLATELET - Abnormal; Notable for the following components:   WBC 11.9 (*)    MCV 78.6 (*)    MCHC 36.3 (*)    Neutro Abs 10.4 (*)    Abs Immature Granulocytes 0.08 (*)    All other components within normal limits  LIPASE, BLOOD - Abnormal; Notable for the following components:   Lipase 153 (*)    All other components within normal limits    EKG EKG Interpretation  Date/Time:  Sunday May 20 2018 12:18:19 EDT Ventricular Rate:  104 PR Interval:    QRS Duration: 89 QT Interval:  345 QTC Calculation: 454 R Axis:   -15 Text Interpretation:  Sinus tachycardia Borderline left axis deviation Abnormal R-wave progression, early transition Nonspecific repol abnormality, anterior leads unremarkable  ecg Confirmed by Gerhard Munch [phone]) on 05/20/2018 12:20:32 PM   Radiology Dg Chest 2 View  Result Date: 05/20/2018 CLINICAL DATA:  Abdominal pain, cough and shortness of breath since yesterday. EXAM: CHEST - 2 VIEW COMPARISON:  Chest x-ray dated 10/12/2017. FINDINGS: Heart size and mediastinal contours are within normal limits. Lungs are clear. No pleural effusion or pneumothorax seen. Large amount of air under the LEFT hemidiaphragm. This is most likely within stomach or colon, with associated air-fluid level. Similar appearance demonstrated on earlier chest x-rays of 10/12/2017 and 06/06/2012. Osseous structures about the chest are unremarkable. IMPRESSION: 1. No active cardiopulmonary disease. No evidence of pneumonia or pulmonary edema. 2. Large amount of air under the LEFT hemidiaphragm, most likely air within  distended stomach or colon with associated air-fluid level. Given the history of abdominal pain, would consider CT abdomen and pelvis to exclude associated bowel obstruction and to exclude the less likely possibility of free intraperitoneal air. Electronically Signed   By: Bary Richard M.D.   On: 05/20/2018 13:08   Ct Abdomen Pelvis W Contrast  Result Date: 05/20/2018 CLINICAL DATA:  Abdominal pain, cough and shortness of breath beginning yesterday. EXAM: CT ABDOMEN AND PELVIS WITH CONTRAST TECHNIQUE: Multidetector CT imaging of the  abdomen and pelvis was performed using the standard protocol following bolus administration of intravenous contrast. CONTRAST:  100 mL OMNIPAQUE IOHEXOL 300 MG/ML  SOLN COMPARISON:  None. FINDINGS: Lower chest: Lung bases are clear. No pleural or pericardial effusion. Hepatobiliary: No focal liver abnormality is seen. No gallstones, gallbladder wall thickening, or biliary dilatation. Pancreas: Unremarkable. No pancreatic ductal dilatation or surrounding inflammatory changes. Spleen: Normal in size without focal abnormality. Adrenals/Urinary Tract: Adrenal glands are unremarkable. Kidneys are normal, without renal calculi, solid lesion, or hydronephrosis. Small left renal cyst noted. Bladder is unremarkable. Stomach/Bowel: The stomach is massively distended. The first portion of the duodenum and pylorus are displaced superiorly and posteriorly anterior to the aorta highly suspicious for gastric volvulus. The remainder of the small bowel is unremarkable. The colon is largely decompressed. Vascular/Lymphatic: Aortic atherosclerosis. No enlarged abdominal or pelvic lymph nodes. Reproductive: Calcified uterine fibroids noted. Other: No free intraperitoneal air. Musculoskeletal: No acute or focal bony abnormality. Convex right lumbar scoliosis. Bilateral hip osteoarthritis is severe on the right. There also degenerative disease about the sacroiliac joints. IMPRESSION: Findings highly  suspicious for gastric volvulus. Atherosclerosis. Critical Value/emergent results were called by telephone at the time of interpretation on 05/20/2018 at 2:48 pm to Dr. Gerhard Munch , who verbally acknowledged these results. Electronically Signed   By: Drusilla Kanner M.D.   On: 05/20/2018 14:48   Dg Abd Portable 1 View  Result Date: 05/20/2018 CLINICAL DATA:  Status post NG tube placement. EXAM: PORTABLE ABDOMEN - 1 VIEW COMPARISON:  None. FINDINGS: NG tube is in place with the tip in the stomach. Side-port is at the gastroesophageal junction. Recommend advancement of 3-4 cm. IMPRESSION: As above. Electronically Signed   By: Drusilla Kanner M.D.   On: 05/20/2018 16:01    Procedures Procedures (including critical care time)  Medications Ordered in ED Medications  0.9 %  sodium chloride infusion ( Intravenous New Bag/Given 05/20/18 1434)  ondansetron (ZOFRAN) injection 4 mg (4 mg Intravenous Given 05/20/18 1435)  iohexol (OMNIPAQUE) 300 MG/ML solution 100 mL (100 mLs Intravenous Contrast Given 05/20/18 1407)     Initial Impression / Assessment and Plan / ED Course  I have reviewed the triage vital signs and the nursing notes.  Pertinent labs & imaging results that were available during my care of the patient were reviewed by me and considered in my medical decision making (see chart for details).    Patient improved with diuretics, fluids     Update:, I discussed the patient's CT findings with the radiologist given concern for volvulus, gastric. Subsequently discussed patient's case with her general surgeon on-call, who will see and evaluate the patient. Patient had emergent NG tube placed.  4:08 PM Discussed patient case with our GI colleagues as well, patient will need emergent EGD for reduction of her gastric volvulus. Patient and her niece are aware of all findings. After emergent EGD, the patient will be admitted to the hospitalist team, with surgery following for additional  therapy, monitoring, management.  This elderly female presents with new upper abdominal pain, cough.  Patient found to have gastric volvulus Cough may be secondary to diaphragmatic irritation from distention. Patient had NG tube placed in emergency department, had emergent EGD plan, admission to the hospitalist service.  Final Clinical Impressions(s) / ED Diagnoses  Gastric volvulus  CRITICAL CARE Performed by: Gerhard Munch Total critical care time: 35 minutes Critical care time was exclusive of separately billable procedures and treating other patients. Critical care was necessary to treat  or prevent imminent or life-threatening deterioration. Critical care was time spent personally by me on the following activities: development of treatment plan with patient and/or surrogate as well as nursing, discussions with consultants, evaluation of patient's response to treatment, examination of patient, obtaining history from patient or surrogate, ordering and performing treatments and interventions, ordering and review of laboratory studies, ordering and review of radiographic studies, pulse oximetry and re-evaluation of patient's condition.    Gerhard Munch, MD 05/20/18 [phone]

## 2018-05-20 NOTE — Anesthesia Preprocedure Evaluation (Addendum)
Anesthesia Evaluation  Patient identified by MRN, date of birth, ID band Patient awake    Reviewed: Allergy & Precautions, NPO status , Patient's Chart, lab work & pertinent test results  Airway Mallampati: II  TM Distance: >3 FB Neck ROM: Full Positive for:  Tracheal deviation   Dental  (+) Teeth Intact, Partial Upper   Pulmonary former smoker,    breath sounds clear to auscultation       Cardiovascular hypertension,  Rhythm:Regular Rate:Normal     Neuro/Psych    GI/Hepatic   Endo/Other  diabetes  Renal/GU      Musculoskeletal   Abdominal   Peds  Hematology   Anesthesia Other Findings   Reproductive/Obstetrics                             Anesthesia Physical Anesthesia Plan  ASA: III and emergent  Anesthesia Plan: General   Post-op Pain Management:    Induction: Intravenous, Cricoid pressure planned and Rapid sequence  PONV Risk Score and Plan: Ondansetron  Airway Management Planned: Oral ETT  Additional Equipment:   Intra-op Plan:   Post-operative Plan: Extubation in OR  Informed Consent: I have reviewed the patients History and Physical, chart, labs and discussed the procedure including the risks, benefits and alternatives for the proposed anesthesia with the patient or authorized representative who has indicated his/her understanding and acceptance.     Dental advisory given  Plan Discussed with: CRNA and Anesthesiologist  Anesthesia Plan Comments:         Anesthesia Quick Evaluation

## 2018-05-20 NOTE — Anesthesia Postprocedure Evaluation (Signed)
Anesthesia Post Note  Patient: Leretha Dykes  Procedure(s) Performed: ESOPHAGOGASTRODUODENOSCOPY (EGD) WITH PROPOFOL (N/A ) EXPLORATORY LAPAROTOMY (N/A Abdomen) Insertion Of Gastrostomy Tube and Repair of Volvulus (N/A Abdomen)     Patient location during evaluation: PACU Anesthesia Type: General Level of consciousness: awake and alert Pain management: pain level controlled Vital Signs Assessment: post-procedure vital signs reviewed and stable Respiratory status: spontaneous breathing, nonlabored ventilation, respiratory function stable and patient connected to nasal cannula oxygen Cardiovascular status: blood pressure returned to baseline and stable Postop Assessment: no apparent nausea or vomiting Anesthetic complications: no    Last Vitals:  Vitals:   05/20/18 2024 05/20/18 2043  BP: (!) 160/68 (!) 176/76  Pulse: 78 78  Resp: (!) 27 20  Temp: (!) 36.3 C 36.8 C  SpO2: 99% 96%    Last Pain:  Vitals:   05/20/18 2043  TempSrc: Oral  PainSc: 0-No pain                 Braelynn Lupton COKER

## 2018-05-20 NOTE — ED Notes (Signed)
Advanced NG tube 4cm

## 2018-05-20 NOTE — ED Notes (Signed)
Placed 65F NG tube in pt per MD Janee Morn

## 2018-05-20 NOTE — Transfer of Care (Signed)
Immediate Anesthesia Transfer of Care Note  Patient: Dana Saunders  Procedure(s) Performed: ESOPHAGOGASTRODUODENOSCOPY (EGD) WITH PROPOFOL (N/A ) EXPLORATORY LAPAROTOMY (N/A Abdomen) Insertion Of Gastrostomy Tube and Repair of Volvulus (N/A Abdomen)  Patient Location: PACU  Anesthesia Type:General  Level of Consciousness: awake and confused  Airway & Oxygen Therapy: Patient connected to face mask oxygen  Post-op Assessment: Report given to RN, Post -op Vital signs reviewed and stable and Patient moving all extremities X 4  Post vital signs: Reviewed and stable  Last Vitals:  Vitals Value Taken Time  BP 185/99 05/20/2018  7:54 PM  Temp    Pulse 86 05/20/2018  7:56 PM  Resp 25 05/20/2018  7:56 PM  SpO2 100 % 05/20/2018  7:56 PM  Vitals shown include unvalidated device data.  Last Pain:  Vitals:   05/20/18 1646  TempSrc: Oral  PainSc: 0-No pain      Patients Stated Pain Goal: 0 (05/20/18 1217)  Complications: No apparent anesthesia complications

## 2018-05-20 NOTE — H&P (Addendum)
Dana Saunders is an 85 y.o. female.    Chief Complaint: Gastric volvulus  HPI: 85 year old African-American female presents with 1 day history of abdominal pain associated with nausea and nonbloody vomiting.  Patient states she was not able to keep anything down, had soreness in the upper abdomen without any fever.  She has not had a bowel movement for over a day. She tried taking Pepto-Bismol at home without any relief.  On arrival she had a chest x-ray which showed large amount of air under left hemidiaphragm, air likely within distended stomach or colon with associated air-fluid level and a CAT scan was advised.  CAT scan done in the ED showed massively distended stomach, first portion of duodenum and pylorus displaced superiorly and posteriorly anterior to aorta highly suspicious for gastric volvulus, remainder of small bowel and colon unremarkable and decompressed.  NG tube was placed in the ER for decompression and GI consult was requested.  Past Medical History:  Diagnosis Date  . Diabetes mellitus without complication (HCC)   . Hyperlipemia   . Hypertension     History reviewed. No pertinent surgical history.  Family History  Problem Relation Age of Onset  . Diabetes Mother   . Cancer Father    Social History:  reports that she has quit smoking. She has never used smokeless tobacco. She reports previous alcohol use. No history on file for drug.  Allergies: No Known Allergies  Medications Prior to Admission  Medication Sig Dispense Refill  . cetirizine (ZYRTEC) 10 MG tablet Take 10 mg by mouth daily.    . hydrochlorothiazide (HYDRODIURIL) 12.5 MG tablet Take 12.5 mg by mouth every morning.    Marland Kitchen levothyroxine (SYNTHROID, LEVOTHROID) 88 MCG tablet Take 88 mcg by mouth every morning.    Marland Kitchen losartan (COZAAR) 50 MG tablet Take 50 mg by mouth daily.    . metFORMIN (GLUCOPHAGE) 500 MG tablet Take 1,000 mg by mouth 2 (two) times daily.    . pravastatin (PRAVACHOL) 40 MG tablet  Take 40 mg by mouth daily.    . sitaGLIPtin (JANUVIA) 50 MG tablet Take 50 mg by mouth daily.      Results for orders placed or performed during the hospital encounter of 05/20/18 (from the past 48 hour(s))  Comprehensive metabolic panel     Status: Abnormal   Collection Time: 05/20/18 12:22 PM  Result Value Ref Range   Sodium 130 (L) 135 - 145 mmol/L   Potassium 3.7 3.5 - 5.1 mmol/L   Chloride 92 (L) 98 - 111 mmol/L   CO2 21 (L) 22 - 32 mmol/L   Glucose, Bld 304 (H) 70 - 99 mg/dL   BUN 11 8 - 23 mg/dL   Creatinine, Ser 8.31 0.44 - 1.00 mg/dL   Calcium 9.6 8.9 - 51.7 mg/dL   Total Protein 9.5 (H) 6.5 - 8.1 g/dL   Albumin 4.7 3.5 - 5.0 g/dL   AST 25 15 - 41 U/L   ALT 11 0 - 44 U/L   Alkaline Phosphatase 58 38 - 126 U/L   Total Bilirubin 0.8 0.3 - 1.2 mg/dL   GFR calc non Af Amer >60 >60 mL/min    Comment: CORRECTED ON 03/22 AT 1316: PREVIOUSLY REPORTED AS NOT CALCULATED   GFR calc Af Amer >60 >60 mL/min    Comment: CORRECTED ON 03/22 AT 1316: PREVIOUSLY REPORTED AS NOT CALCULATED   Anion gap 17 (H) 5 - 15    Comment: Performed at [facility], [address], [place], [place] [address]  CBC with Differential     Status: Abnormal   Collection Time: 05/20/18 12:22 PM  Result Value Ref Range   WBC 11.9 (H) 4.0 - 10.5 K/uL   RBC 5.09 3.87 - 5.11 MIL/uL   Hemoglobin 14.5 12.0 - 15.0 g/dL   HCT 93.2 67.1 - 24.5 %   MCV 78.6 (L) 80.0 - 100.0 fL   MCH 28.5 26.0 - 34.0 pg   MCHC 36.3 (H) 30.0 - 36.0 g/dL   RDW 80.9 98.3 - 38.2 %   Platelets 216 150 - 400 K/uL   nRBC 0.0 0.0 - 0.2 %   Neutrophils Relative % 87 %   Neutro Abs 10.4 (H) 1.7 - 7.7 K/uL   Lymphocytes Relative 6 %   Lymphs Abs 0.7 0.7 - 4.0 K/uL   Monocytes Relative 6 %   Monocytes Absolute 0.7 0.1 - 1.0 K/uL   Eosinophils Relative 0 %   Eosinophils Absolute 0.0 0.0 - 0.5 K/uL   Basophils Relative 0 %   Basophils Absolute 0.0 0.0 - 0.1 K/uL   Immature Granulocytes 1 %   Abs Immature Granulocytes 0.08 (H) 0.00  - 0.07 K/uL    Comment: Performed at [facility], [address] [place], [place] [address]  Lipase, blood     Status: Abnormal   Collection Time: 05/20/18 12:22 PM  Result Value Ref Range   Lipase 153 (H) 11 - 51 U/L    Comment: Performed at [facility], [address] [place], [place] [address]   Dg Chest 2 View  Result Date: 05/20/2018 CLINICAL DATA:  Abdominal pain, cough and shortness of breath since yesterday. EXAM: CHEST - 2 VIEW COMPARISON:  Chest x-ray dated 10/12/2017. FINDINGS: Heart size and mediastinal contours are within normal limits. Lungs are clear. No pleural effusion or pneumothorax seen. Large amount of air under the LEFT hemidiaphragm. This is most likely within stomach or colon, with associated air-fluid level. Similar appearance demonstrated on earlier chest x-rays of 10/12/2017 and 06/06/2012. Osseous structures about the chest are unremarkable. IMPRESSION: 1. No active cardiopulmonary disease. No evidence of pneumonia or pulmonary edema. 2. Large amount of air under the LEFT hemidiaphragm, most likely air within distended stomach or colon with associated air-fluid level. Given the history of abdominal pain, would consider CT abdomen and pelvis to exclude associated bowel obstruction and to exclude the less likely possibility of free intraperitoneal air. Electronically Signed   By: Bary Richard M.D.   On: 05/20/2018 13:08   Ct Abdomen Pelvis W Contrast  Result Date: 05/20/2018 CLINICAL DATA:  Abdominal pain, cough and shortness of breath beginning yesterday. EXAM: CT ABDOMEN AND PELVIS WITH CONTRAST TECHNIQUE: Multidetector CT imaging of the abdomen and pelvis was performed using the standard protocol following bolus administration of intravenous contrast. CONTRAST:  100 mL OMNIPAQUE IOHEXOL 300 MG/ML  SOLN COMPARISON:  None. FINDINGS: Lower chest: Lung bases are clear. No pleural or pericardial effusion. Hepatobiliary: No focal liver abnormality is seen. No  gallstones, gallbladder wall thickening, or biliary dilatation. Pancreas: Unremarkable. No pancreatic ductal dilatation or surrounding inflammatory changes. Spleen: Normal in size without focal abnormality. Adrenals/Urinary Tract: Adrenal glands are unremarkable. Kidneys are normal, without renal calculi, solid lesion, or hydronephrosis. Small left renal cyst noted. Bladder is unremarkable. Stomach/Bowel: The stomach is massively distended. The first portion of the duodenum and pylorus are displaced superiorly and posteriorly anterior to the aorta highly suspicious for gastric volvulus. The remainder of the small  bowel is unremarkable. The colon is largely decompressed. Vascular/Lymphatic: Aortic atherosclerosis. No enlarged abdominal or pelvic lymph nodes. Reproductive: Calcified uterine fibroids noted. Other: No free intraperitoneal air. Musculoskeletal: No acute or focal bony abnormality. Convex right lumbar scoliosis. Bilateral hip osteoarthritis is severe on the right. There also degenerative disease about the sacroiliac joints. IMPRESSION: Findings highly suspicious for gastric volvulus. Atherosclerosis. Critical Value/emergent results were called by telephone at the time of interpretation on 05/20/2018 at 2:48 pm to Dr. Gerhard Munch , who verbally acknowledged these results. Electronically Signed   By: Drusilla Kanner M.D.   On: 05/20/2018 14:48   Dg Abd Portable 1 View  Result Date: 05/20/2018 CLINICAL DATA:  Status post NG tube placement. EXAM: PORTABLE ABDOMEN - 1 VIEW COMPARISON:  None. FINDINGS: NG tube is in place with the tip in the stomach. Side-port is at the gastroesophageal junction. Recommend advancement of 3-4 cm. IMPRESSION: As above. Electronically Signed   By: Drusilla Kanner M.D.   On: 05/20/2018 16:01    Review of Systems  Constitutional: Positive for chills.  HENT: Negative.   Eyes: Negative.   Respiratory: Positive for cough.   Cardiovascular: Positive for chest pain.   Gastrointestinal: Positive for abdominal pain, heartburn, nausea and vomiting.  Genitourinary: Negative.   Musculoskeletal: Negative.   Skin: Negative.   Neurological: Negative.   Psychiatric/Behavioral: Negative.     Blood pressure (!) 193/80, pulse 89, temperature 97.7 F (36.5 C), temperature source Oral, resp. rate 16, height  (1.6 m), weight 65.8 kg, SpO2 100 %. Physical Exam  Constitutional: She is oriented to person, place, and time. She appears well-developed and well-nourished.  HENT:  Head: Atraumatic.  Eyes: Conjunctivae are normal.  Neck: Normal range of motion. Neck supple.  Cardiovascular: Regular rhythm and normal heart sounds.  Respiratory: Effort normal and breath sounds normal.  GI: Soft. There is abdominal tenderness. There is no rebound and no guarding.  Musculoskeletal: Normal range of motion.  Neurological: She is alert and oriented to person, place, and time.  Skin: Skin is warm and dry.  Psychiatric: She has a normal mood and affect.     Assessment/Plan Clinical presentation suspicious for acute gastric volvulus Mild leukocytosis, mild tachycardia, hyperglycemia(history of diabetes), mild acidosis, minimally elevated lipase(normal-appearing pancreas on CAT scan)   Will proceed with an EGD to decompress the stomach. Hopefully will be able to leave NG tube to low intermittent suction. May need surgery for gastropexy post EGD, depending upon clinical status and repeat imaging. Risks and benefits of the procedure were discussed with the patient in details. She understands and verbalizes consent  Kerin Salen, MD 05/20/2018, 5:06 PM

## 2018-05-20 NOTE — ED Triage Notes (Signed)
Pt arrives to ED from home with complaints of abdominal pain, cough, and SOB since yesterday 3/21. Pt states shes been feeling more weak over this week but denies any chills or fevers at home.

## 2018-05-21 ENCOUNTER — Encounter (HOSPITAL_COMMUNITY): Payer: Self-pay | Admitting: General Surgery

## 2018-05-21 LAB — CBC
HCT: 34.9 % — ABNORMAL LOW (ref 36.0–46.0)
Hemoglobin: 12.5 g/dL (ref 12.0–15.0)
MCH: 28 pg (ref 26.0–34.0)
MCHC: 35.8 g/dL (ref 30.0–36.0)
MCV: 78.1 fL — ABNORMAL LOW (ref 80.0–100.0)
Platelets: 213 10*3/uL (ref 150–400)
RBC: 4.47 MIL/uL (ref 3.87–5.11)
RDW: 12.8 % (ref 11.5–15.5)
WBC: 15.6 10*3/uL — ABNORMAL HIGH (ref 4.0–10.5)
nRBC: 0 % (ref 0.0–0.2)

## 2018-05-21 LAB — GLUCOSE, CAPILLARY
GLUCOSE-CAPILLARY: 170 mg/dL — AB (ref 70–99)
Glucose-Capillary: 109 mg/dL — ABNORMAL HIGH (ref 70–99)
Glucose-Capillary: 133 mg/dL — ABNORMAL HIGH (ref 70–99)
Glucose-Capillary: 144 mg/dL — ABNORMAL HIGH (ref 70–99)
Glucose-Capillary: 182 mg/dL — ABNORMAL HIGH (ref 70–99)
Glucose-Capillary: 189 mg/dL — ABNORMAL HIGH (ref 70–99)
Glucose-Capillary: 90 mg/dL (ref 70–99)

## 2018-05-21 LAB — COMPREHENSIVE METABOLIC PANEL
ALT: 13 U/L (ref 0–44)
ANION GAP: 12 (ref 5–15)
AST: 23 U/L (ref 15–41)
Albumin: 3.5 g/dL (ref 3.5–5.0)
Alkaline Phosphatase: 44 U/L (ref 38–126)
BUN: 7 mg/dL — ABNORMAL LOW (ref 8–23)
CO2: 22 mmol/L (ref 22–32)
Calcium: 8.2 mg/dL — ABNORMAL LOW (ref 8.9–10.3)
Chloride: 98 mmol/L (ref 98–111)
Creatinine, Ser: 0.58 mg/dL (ref 0.44–1.00)
GFR calc Af Amer: >60 mL/min (ref >60)
GFR calc non Af Amer: >60 mL/min (ref >60)
Glucose, Bld: 192 mg/dL — ABNORMAL HIGH (ref 70–99)
Potassium: 3.4 mmol/L — ABNORMAL LOW (ref 3.5–5.1)
Sodium: 132 mmol/L — ABNORMAL LOW (ref 135–145)
TOTAL PROTEIN: 7.3 g/dL (ref 6.5–8.1)
Total Bilirubin: 0.8 mg/dL (ref 0.3–1.2)

## 2018-05-21 NOTE — Progress Notes (Signed)
[facility] Gastroenterology Progress Note  Dana Saunders 85 y.o. 1930-03-26   Subjective: Patient in bedside chair. Abdominal soreness - s/p gastric volvulus surgery yesterday.  Objective: Vital signs: Vitals:   05/21/18 0156 05/21/18 0450  BP: (!) 151/78 130/60  Pulse: 94 79  Resp: 19 18  Temp: 99.3 F (37.4 C) 98.9 F (37.2 C)  SpO2: 100% 96%    Physical Exam: Gen: lethargic, elderly, no acute distress  HEENT: anicteric sclera CV: RRR Chest: CTA B Abd: wound dressing not removed, G-tube noted, soft Ext: no edema  Lab Results: Recent Labs    05/20/18 1222 05/21/18 0216  NA 130* 132*  K 3.7 3.4*  CL 92* 98  CO2 21* 22  GLUCOSE 304* 192*  BUN 11 7*  CREATININE 0.73 0.58  CALCIUM 9.6 8.2*   Recent Labs    05/20/18 1222 05/21/18 0216  AST 25 23  ALT 11 13  ALKPHOS 58 44  BILITOT 0.8 0.8  PROT 9.5* 7.3  ALBUMIN 4.7 3.5   Recent Labs    05/20/18 1222 05/21/18 0216  WBC 11.9* 15.6*  NEUTROABS 10.4*  --   HGB 14.5 12.5  HCT 40.0 34.9*  MCV 78.6* 78.1*  PLT 216 213      Assessment/Plan: Gastric volvulus - s/p EGD and surgery yesterday. Diet recs per surgery. Will sign off. Call us back if questions.   Dana Saunders 05/21/2018, 10:46 AM  Questions please call [phone] ID: Dana Saunders, female   DOB: May 28, 1930, 85 y.o.   MRN: [id number]

## 2018-05-21 NOTE — TOC Initial Note (Signed)
Transition of Care [facility]) - Initial/Assessment Note    Patient Details  Name: Dana Saunders MRN: [id number] Date of Birth: May 22, 1930  Transition of Care [facility] CM/SW Contact:    Kingsley Plan, RN Phone Number: 05/21/2018, 9:55 AM  Clinical Narrative:                  Patient from home with daughter. Has walker and cane.   Await PT/OT eval  Expected Discharge Plan: Home w Home Health Services Barriers to Discharge: Continued Medical Work up   Patient Goals and CMS Choice Patient states their goals for this hospitalization and ongoing recovery are:: to go home       Expected Discharge Plan and Services Expected Discharge Plan: Home w Home Health Services       Living arrangements for the past 2 months: Single Family Home                          Prior Living Arrangements/Services Living arrangements for the past 2 months: Single Family Home Lives with:: Adult Children   Do you feel safe going back to the place where you live?: Yes          Current home services: DME    Activities of Daily Living Home Assistive Devices/Equipment: Eyeglasses, Environmental consultant (specify type), Wheelchair, Medical laboratory scientific officer (specify quad or straight), Dentures (specify type), CBG Meter ADL Screening (condition at time of admission) Patient's cognitive ability adequate to safely complete daily activities?: Yes Is the patient deaf or have difficulty hearing?: No Does the patient have difficulty seeing, even when wearing glasses/contacts?: No(uses reading glasses) Does the patient have difficulty concentrating, remembering, or making decisions?: No Patient able to express need for assistance with ADLs?: Yes Does the patient have difficulty dressing or bathing?: No Independently performs ADLs?: Yes (appropriate for developmental age) Does the patient have difficulty walking or climbing stairs?: No Weakness of Legs: Both Weakness of Arms/Hands: None  Permission Sought/Granted Permission sought to  share information with : Case Manager                Emotional Assessment              Admission diagnosis:  abd pain,cough Patient Active Problem List   Diagnosis Date Noted  . Gastric volvulus 05/20/2018  . Hyponatremia 05/20/2018  . Elevated lipase 05/20/2018  . DM type 2 (diabetes mellitus, type 2) (HCC) 05/20/2018  . Hyperlipidemia 05/20/2018  . Benign essential HTN 05/20/2018   PCP:  Renford Dills, MD Pharmacy:   [facility] [address] [place] - [address] [place] [address] Phone: [phone] Fax: [phone]     Social Determinants of Health (SDOH) Interventions    Readmission Risk Interventions No flowsheet data found.

## 2018-05-21 NOTE — Progress Notes (Signed)
Subjective No acute events. Feeling reasonably well. Pain well controlled - some incisional soreness, denies any other pains. Denies n/v. GTube in place to gravity. Denies flatus/BM yet.  Objective: Vital signs in last 24 hours: Temp:  [97.4 F (36.3 C)-99.3 F (37.4 C)] 98.9 F (37.2 C) (03/23 0450) Pulse Rate:  [75-102] 79 (03/23 0450) Resp:  [0-27] 18 (03/23 0450) BP: (105-195)/(60-100) 130/60 (03/23 0450) SpO2:  [92 %-100 %] 96 % (03/23 0450) Weight:  [65.8 kg] 65.8 kg (03/22 1218) Last BM Date: 05/19/18  Intake/Output from previous day: 03/22 0701 - 03/23 0700 In: 3269.4 [I.V.:3219.4] Out: 2525 [Urine:1200; Emesis/NG output:600; Drains:100; Blood:25] Intake/Output this shift: No intake/output data recorded.  Gen: NAD, comfortable CV: RRR Pulm: Normal work of breathing Abd: Soft, approp ttp around incision, nothing elsewhere. G tube in place to gravity. Ext: SCDs in place  Lab Results: CBC  Recent Labs    05/20/18 1222 05/21/18 0216  WBC 11.9* 15.6*  HGB 14.5 12.5  HCT 40.0 34.9*  PLT 216 213   BMET Recent Labs    05/20/18 1222 05/21/18 0216  NA 130* 132*  K 3.7 3.4*  CL 92* 98  CO2 21* 22  GLUCOSE 304* 192*  BUN 11 7*  CREATININE 0.73 0.58  CALCIUM 9.6 8.2*   PT/INR No results for input(s): LABPROT, INR in the last 72 hours. ABG No results for input(s): PHART, HCO3 in the last 72 hours.  Invalid input(s): PCO2, PO2  Studies/Results:  Anti-infectives: Anti-infectives (From admission, onward)   None       Assessment/Plan: Patient Active Problem List   Diagnosis Date Noted  . Gastric volvulus 05/20/2018  . Hyponatremia 05/20/2018  . Elevated lipase 05/20/2018  . DM type 2 (diabetes mellitus, type 2) (HCC) 05/20/2018  . Hyperlipidemia 05/20/2018  . Benign essential HTN 05/20/2018   s/p Procedure(s): EXPLORATORY LAPAROTOMY Insertion Of Gastrostomy Tube and Repair of Volvulus 05/20/2018  -Await return of bowel function; continue NPO,  MIVF -Continue G tube to gravity drainage -PT/OT; ambulate -PPx: SCDs; ok for prophylactic chemical dvt ppx   LOS: 1 day   Stephanie Coup. Cliffton Asters, M.D. [facility]

## 2018-05-21 NOTE — Progress Notes (Signed)
Initial Nutrition Assessment  DOCUMENTATION CODES:   Non-severe (moderate) malnutrition in context of acute illness/injury  INTERVENTION:  At diet advancement to CL, recommend Boost Breeze po TID, each supplement provides 250 kcal and 9 grams of protein (patient would like to try orange)   NUTRITION DIAGNOSIS:   Moderate Malnutrition related to acute illness as evidenced by moderate muscle depletion, moderate fat depletion, energy intake < or equal to 50% for > or equal to 5 days.   GOAL:   Patient will meet greater than or equal to 90% of their needs   MONITOR:   Weight trends, Diet advancement, I & O's, Labs  REASON FOR ASSESSMENT:   Malnutrition Screening Tool    ASSESSMENT:  85 year old female with PMH of T2DM, HLD, HTN presented to ED with abdominal pain, nausea, and vomiting admitted with gastric volvulus. NG placed in ER for decompression. Patient s/p exploratory laparotomy and Gtube placement on 3/22  Patient requesting to be assisted with returning to bed from chair at visit. RD made nurse aware and returned to room after patient was settled. She reports feeling tired today, denies nausea and reports that she is not passing gas or had a BM since surgery.   At home patient reports 2-3 meals/day and meals prepared by her daughter. She recalls spaghetti, salads, eggs, sausage, cereal, and Chinese take out. Patient reports that she drinks an Ensure or fruit smoothie made with whole milk if she is having poor appetite. She stated that she barely ate anything over the past week d/t pain and constant feelings of nausea.   Patient endorses recent wt loss and noticed her clothes becoming more loose over the past few months. She reports UBW of 150 lbs. Currently, pt weighs 144.76 lb.   Medications reviewed and include: SSI, Dilaudid, IVF Labs: Potassium 3.4 (L) - replacing  Glucose 192 (H)   3/22: Gastrostomy 24 Fr; RUQ; 50 mL output  NG output 600 mL x 24 hr  NUTRITION -  FOCUSED PHYSICAL EXAM:    Most Recent Value  Orbital Region  Moderate depletion  Upper Arm Region  Moderate depletion  Thoracic and Lumbar Region  Moderate depletion  Buccal Region  Moderate depletion  Temple Region  Severe depletion  Clavicle Bone Region  Severe depletion  Clavicle and Acromion Bone Region  Moderate depletion  Scapular Bone Region  Moderate depletion  Dorsal Hand  Moderate depletion  Patellar Region  Moderate depletion  Anterior Thigh Region  Mild depletion  Posterior Calf Region  Mild depletion  Edema (RD Assessment)  None  Hair  Reviewed  Eyes  Reviewed  Mouth  Reviewed [wears dentures]  Skin  Reviewed  Nails  Reviewed       Diet Order:   Diet Order            Diet NPO time specified  Diet effective now              EDUCATION NEEDS:   No education needs have been identified at this time  Skin:     Last BM:  3/21  Height:   Ht Readings from Last 1 Encounters:  05/20/18 5\' 3"  (1.6 m)    Weight:   Wt Readings from Last 1 Encounters:  05/20/18 65.8 kg    Ideal Body Weight:  52.3 kg  BMI:  Body mass index is 25.69 kg/m.  Estimated Nutritional Needs:   Kcal:  1800-1975  Protein:  90-105 grams  Fluid:  >1.9L   Rosalita Chessman  Truddie Crumble, [place], [place]  After Hours/Weekend Pager: [phone]

## 2018-05-21 NOTE — Progress Notes (Signed)
PROGRESS NOTE  Dana Saunders GNF:[id number] DOB: 09-17-1930 DOA: 05/20/2018 PCP: Renford Dills, MD  HPI/Recap of past 24 hours:  Post op day one, sitting up in chair , denies pain, no n/v, no fever  Gastrostomy tube  Assessment/Plan: Principal Problem:   Gastric volvulus Active Problems:   Hyponatremia   Elevated lipase   DM type 2 (diabetes mellitus, type 2) (HCC)   Hyperlipidemia   Benign essential HTN  Gastric volvulus  S/p urgent EGD, then ex lap with reduction and GASTROSTOMY TUBE PLACEMENT Management per general surgery  Hyponatremia Hold home meds HCTZ, on hydration   noninsulin dependent DM2 -home meds metformin/januvia held -on ssi  HTN;  Home meds losartan /HCTZ held , bp stable  HLD; statin held   Hypothyroidism Po synthroid, changed to iv synthroid  COVID likelihood: low Physician PPE: surgical mask Patient PPE: none   COVID Testing: not indicated per current ID/[place] guidelines   Code Status: full  Family Communication: patient   Disposition Plan: not ready to discharge, need general surgery clearance   Consultants:  GI  General surgery  Procedures:  Urgent EGD on 3/22 EXPLORATORY LAPAROTOMY REDUCTION OF GASTRIC VOLVULUS GASTROSTOMY TUBE PLACEMENT 24FR MALECOT  Antibiotics:  none   Objective: BP 130/60 (BP Location: Right Arm)    Pulse 79    Temp 98.9 F (37.2 C) (Oral)    Resp 18    Ht  (1.6 m)    Wt 65.8 kg    SpO2 96%    BMI 25.69 kg/m   Intake/Output Summary (Last 24 hours) at 05/21/2018 0808 Last data filed at 05/21/2018 0554 Gross per 24 hour  Intake 3269.36 ml  Output 2525 ml  Net 744.36 ml   Filed Weights   05/20/18 1218  Weight: 65.8 kg    Exam: Patient is examined daily including today on 05/21/2018, exams remain the same as of yesterday except that has changed    General:  NAD  Cardiovascular: RRR  Respiratory: CTABL  Abdomen: post op changes, + gastrostomy tube, positive  BS  Musculoskeletal: No Edema  Neuro: alert, oriented   Data Reviewed: Basic Metabolic Panel: Recent Labs  Lab 05/20/18 1222 05/21/18 0216  NA 130* 132*  K 3.7 3.4*  CL 92* 98  CO2 21* 22  GLUCOSE 304* 192*  BUN 11 7*  CREATININE 0.73 0.58  CALCIUM 9.6 8.2*   Liver Function Tests: Recent Labs  Lab 05/20/18 1222 05/21/18 0216  AST 25 23  ALT 11 13  ALKPHOS 58 44  BILITOT 0.8 0.8  PROT 9.5* 7.3  ALBUMIN 4.7 3.5   Recent Labs  Lab 05/20/18 1222  LIPASE 153*   No results for input(s): AMMONIA in the last 168 hours. CBC: Recent Labs  Lab 05/20/18 1222 05/21/18 0216  WBC 11.9* 15.6*  NEUTROABS 10.4*  --   HGB 14.5 12.5  HCT 40.0 34.9*  MCV 78.6* 78.1*  PLT 216 213   Cardiac Enzymes:   No results for input(s): CKTOTAL, CKMB, CKMBINDEX, TROPONINI in the last 168 hours. BNP (last 3 results) No results for input(s): BNP in the last 8760 hours.  ProBNP (last 3 results) No results for input(s): PROBNP in the last 8760 hours.  CBG: Recent Labs  Lab 05/20/18 2046 05/21/18 0000 05/21/18 0052 05/21/18 0447 05/21/18 0738  GLUCAP 131* 189* 182* 170* 144*    No results found for this or any previous visit (from the past 240 hour(s)).   Studies: Dg Chest 2 View  Result Date: 05/20/2018 CLINICAL DATA:  Abdominal pain, cough and shortness of breath since yesterday. EXAM: CHEST - 2 VIEW COMPARISON:  Chest x-ray dated 10/12/2017. FINDINGS: Heart size and mediastinal contours are within normal limits. Lungs are clear. No pleural effusion or pneumothorax seen. Large amount of air under the LEFT hemidiaphragm. This is most likely within stomach or colon, with associated air-fluid level. Similar appearance demonstrated on earlier chest x-rays of 10/12/2017 and 06/06/2012. Osseous structures about the chest are unremarkable. IMPRESSION: 1. No active cardiopulmonary disease. No evidence of pneumonia or pulmonary edema. 2. Large amount of air under the LEFT hemidiaphragm,  most likely air within distended stomach or colon with associated air-fluid level. Given the history of abdominal pain, would consider CT abdomen and pelvis to exclude associated bowel obstruction and to exclude the less likely possibility of free intraperitoneal air. Electronically Signed   By: Bary Richard M.D.   On: 05/20/2018 13:08   Ct Abdomen Pelvis W Contrast  Result Date: 05/20/2018 CLINICAL DATA:  Abdominal pain, cough and shortness of breath beginning yesterday. EXAM: CT ABDOMEN AND PELVIS WITH CONTRAST TECHNIQUE: Multidetector CT imaging of the abdomen and pelvis was performed using the standard protocol following bolus administration of intravenous contrast. CONTRAST:  100 mL OMNIPAQUE IOHEXOL 300 MG/ML  SOLN COMPARISON:  None. FINDINGS: Lower chest: Lung bases are clear. No pleural or pericardial effusion. Hepatobiliary: No focal liver abnormality is seen. No gallstones, gallbladder wall thickening, or biliary dilatation. Pancreas: Unremarkable. No pancreatic ductal dilatation or surrounding inflammatory changes. Spleen: Normal in size without focal abnormality. Adrenals/Urinary Tract: Adrenal glands are unremarkable. Kidneys are normal, without renal calculi, solid lesion, or hydronephrosis. Small left renal cyst noted. Bladder is unremarkable. Stomach/Bowel: The stomach is massively distended. The first portion of the duodenum and pylorus are displaced superiorly and posteriorly anterior to the aorta highly suspicious for gastric volvulus. The remainder of the small bowel is unremarkable. The colon is largely decompressed. Vascular/Lymphatic: Aortic atherosclerosis. No enlarged abdominal or pelvic lymph nodes. Reproductive: Calcified uterine fibroids noted. Other: No free intraperitoneal air. Musculoskeletal: No acute or focal bony abnormality. Convex right lumbar scoliosis. Bilateral hip osteoarthritis is severe on the right. There also degenerative disease about the sacroiliac joints.  IMPRESSION: Findings highly suspicious for gastric volvulus. Atherosclerosis. Critical Value/emergent results were called by telephone at the time of interpretation on 05/20/2018 at 2:48 pm to Dr. Gerhard Munch , who verbally acknowledged these results. Electronically Signed   By: Drusilla Kanner M.D.   On: 05/20/2018 14:48   Dg Abd Portable 1 View  Result Date: 05/20/2018 CLINICAL DATA:  Status post NG tube placement. EXAM: PORTABLE ABDOMEN - 1 VIEW COMPARISON:  None. FINDINGS: NG tube is in place with the tip in the stomach. Side-port is at the gastroesophageal junction. Recommend advancement of 3-4 cm. IMPRESSION: As above. Electronically Signed   By: Drusilla Kanner M.D.   On: 05/20/2018 16:01    Scheduled Meds:  insulin aspart  0-9 Units Subcutaneous TID WC   levothyroxine  50 mcg Intravenous Daily   sodium chloride flush  3 mL Intravenous Q12H    Continuous Infusions:  sodium chloride Stopped (05/20/18 1820)   sodium chloride     sodium chloride 150 mL/hr at 05/21/18 0304     Time spent: I have personally reviewed and interpreted on  05/21/2018 daily labs,  imagings as discussed above under date review session and assessment and plans.  I reviewed all nursing notes, pharmacy notes, consultant notes,  vitals, pertinent  old records  I have discussed plan of care as described above with RN , patient  on 05/21/2018   Albertine Grates MD, PhD  [facility] Pager [phone]. If 7PM-7AM, please contact night-coverage at [email or web address], password [facility] 05/21/2018, 8:08 AM  LOS: 1 day

## 2018-05-22 ENCOUNTER — Encounter (HOSPITAL_COMMUNITY): Payer: Self-pay | Admitting: Gastroenterology

## 2018-05-22 DIAGNOSIS — E876 Hypokalemia: Secondary | ICD-10-CM

## 2018-05-22 DIAGNOSIS — E440 Moderate protein-calorie malnutrition: Secondary | ICD-10-CM

## 2018-05-22 LAB — CBC WITH DIFFERENTIAL/PLATELET
Abs Immature Granulocytes: 0.07 10*3/uL (ref 0.00–0.07)
Basophils Absolute: 0 10*3/uL (ref 0.0–0.1)
Basophils Relative: 0 %
EOS ABS: 0 10*3/uL (ref 0.0–0.5)
Eosinophils Relative: 0 %
HCT: 30.7 % — ABNORMAL LOW (ref 36.0–46.0)
Hemoglobin: 11.5 g/dL — ABNORMAL LOW (ref 12.0–15.0)
Immature Granulocytes: 1 %
Lymphocytes Relative: 7 %
Lymphs Abs: 1 10*3/uL (ref 0.7–4.0)
MCH: 29.3 pg (ref 26.0–34.0)
MCHC: 37.5 g/dL — AB (ref 30.0–36.0)
MCV: 78.3 fL — ABNORMAL LOW (ref 80.0–100.0)
Monocytes Absolute: 2 10*3/uL — ABNORMAL HIGH (ref 0.1–1.0)
Monocytes Relative: 14 %
Neutro Abs: 11.4 10*3/uL — ABNORMAL HIGH (ref 1.7–7.7)
Neutrophils Relative %: 78 %
Platelets: 184 10*3/uL (ref 150–400)
RBC: 3.92 MIL/uL (ref 3.87–5.11)
RDW: 13.2 % (ref 11.5–15.5)
WBC: 14.4 10*3/uL — AB (ref 4.0–10.5)
nRBC: 0 % (ref 0.0–0.2)

## 2018-05-22 LAB — GLUCOSE, CAPILLARY
GLUCOSE-CAPILLARY: 157 mg/dL — AB (ref 70–99)
Glucose-Capillary: 115 mg/dL — ABNORMAL HIGH (ref 70–99)
Glucose-Capillary: 128 mg/dL — ABNORMAL HIGH (ref 70–99)
Glucose-Capillary: 135 mg/dL — ABNORMAL HIGH (ref 70–99)
Glucose-Capillary: 144 mg/dL — ABNORMAL HIGH (ref 70–99)
Glucose-Capillary: 84 mg/dL (ref 70–99)
Glucose-Capillary: 93 mg/dL (ref 70–99)

## 2018-05-22 LAB — MAGNESIUM: Magnesium: 1.3 mg/dL — ABNORMAL LOW (ref 1.7–2.4)

## 2018-05-22 LAB — BASIC METABOLIC PANEL
Anion gap: 12 (ref 5–15)
BUN: <5 mg/dL — ABNORMAL LOW (ref 8–23)
CALCIUM: 7.3 mg/dL — AB (ref 8.9–10.3)
CO2: 21 mmol/L — ABNORMAL LOW (ref 22–32)
CREATININE: 0.44 mg/dL (ref 0.44–1.00)
Chloride: 99 mmol/L (ref 98–111)
GFR calc Af Amer: >60 mL/min (ref >60)
GFR calc non Af Amer: >60 mL/min (ref >60)
Glucose, Bld: 88 mg/dL (ref 70–99)
Potassium: 2.8 mmol/L — ABNORMAL LOW (ref 3.5–5.1)
Sodium: 132 mmol/L — ABNORMAL LOW (ref 135–145)

## 2018-05-22 LAB — LIPASE, BLOOD: Lipase: 28 U/L (ref 11–51)

## 2018-05-22 MED ORDER — POTASSIUM CHLORIDE 10 MEQ/100ML IV SOLN
10.0000 meq | Freq: Once | INTRAVENOUS | Status: AC
  Administered 2018-05-22: 10 meq via INTRAVENOUS

## 2018-05-22 MED ORDER — MAGNESIUM SULFATE 4 GM/100ML IV SOLN
4.0000 g | Freq: Once | INTRAVENOUS | Status: AC
  Administered 2018-05-22: 4 g via INTRAVENOUS
  Filled 2018-05-22: qty 100

## 2018-05-22 MED ORDER — DEXTROSE IN LACTATED RINGERS 5 % IV SOLN
INTRAVENOUS | Status: DC
  Administered 2018-05-22: 09:00:00 via INTRAVENOUS

## 2018-05-22 MED ORDER — SODIUM CHLORIDE 0.9 % IV SOLN
INTRAVENOUS | Status: DC | PRN
  Administered 2018-05-22: 250 mL via INTRAVENOUS

## 2018-05-22 MED ORDER — POTASSIUM CHLORIDE 10 MEQ/100ML IV SOLN
10.0000 meq | INTRAVENOUS | Status: AC
  Administered 2018-05-22 (×5): 10 meq via INTRAVENOUS
  Filled 2018-05-22 (×6): qty 100

## 2018-05-22 NOTE — Progress Notes (Signed)
PROGRESS NOTE  Dana Saunders NLG:[id number] DOB: [birth year] DOA: 05/20/2018 PCP: Renford Dills, MD  Brief History: 55yow PMH DM type 2, HTN, hyperlipidemia , PRESENTED with abd pain, cough, SOB. CT showed gastric volvulus   HPI/Recap of past 24 hours:  Post op day two,  g tube to gravity, she reports no bm, not passing gas  denies pain, no n/v, no fever   Assessment/Plan: Principal Problem:   Gastric volvulus Active Problems:   Hyponatremia   Elevated lipase   Diabetes mellitus type 2, noninsulin dependent (HCC)   Hyperlipidemia   Benign essential HTN   Malnutrition of moderate degree  Gastric volvulus  S/p urgent EGD, then ex lap with reduction and GASTROSTOMY TUBE PLACEMENT Management per general surgery  Hyponatremia Hold home meds HCTZ, on hydration  Hypokalemia/hypomagnesemia: Replace, repeat labs   noninsulin dependent DM2 -home meds metformin/januvia held -on ssi  HTN;  Home meds losartan /HCTZ held , bp stable  HLD; statin held   Hypothyroidism Po synthroid, changed to iv synthroid  COVID likelihood: low Physician PPE: surgical mask Patient PPE: none   COVID Testing: not indicated per current ID/[place] guidelines   Code Status: full  Family Communication: patient   Disposition Plan: not ready to discharge, need general surgery clearance   Consultants:  GI  General surgery  Procedures:  Urgent EGD on 3/22 EXPLORATORY LAPAROTOMY REDUCTION OF GASTRIC VOLVULUS GASTROSTOMY TUBE PLACEMENT 24FR MALECOT  Antibiotics:  none   Objective: BP 138/78 (BP Location: Right Arm)   Pulse 85   Temp 98 F (36.7 C) (Oral)   Resp 17   Ht 5\' 3"  (1.6 m)   Wt 65.8 kg   SpO2 99%   BMI 25.69 kg/m   Intake/Output Summary (Last 24 hours) at 05/22/2018 0819 Last data filed at 05/22/2018 0810 Gross per 24 hour  Intake 1138.72 ml  Output 2800 ml  Net -1661.28 ml   Filed Weights   05/20/18 1218  Weight: 65.8 kg    Exam: Patient  is examined daily including today on 05/22/2018, exams remain the same as of yesterday except that has changed    General:  NAD  Cardiovascular: RRR  Respiratory: CTABL  Abdomen: post op changes, + gastrostomy tube, positive BS  Musculoskeletal: No Edema  Neuro: alert, oriented   Data Reviewed: Basic Metabolic Panel: Recent Labs  Lab 05/20/18 1222 05/21/18 0216 05/22/18 0136  NA 130* 132* 132*  K 3.7 3.4* 2.8*  CL 92* 98 99  CO2 21* 22 21*  GLUCOSE 304* 192* 88  BUN 11 7* <5*  CREATININE 0.73 0.58 0.44  CALCIUM 9.6 8.2* 7.3*  MG  --   --  1.3*   Liver Function Tests: Recent Labs  Lab 05/20/18 1222 05/21/18 0216  AST 25 23  ALT 11 13  ALKPHOS 58 44  BILITOT 0.8 0.8  PROT 9.5* 7.3  ALBUMIN 4.7 3.5   Recent Labs  Lab 05/20/18 1222 05/22/18 0136  LIPASE 153* 28   No results for input(s): AMMONIA in the last 168 hours. CBC: Recent Labs  Lab 05/20/18 1222 05/21/18 0216 05/22/18 0136  WBC 11.9* 15.6* 14.4*  NEUTROABS 10.4*  --  11.4*  HGB 14.5 12.5 11.5*  HCT 40.0 34.9* 30.7*  MCV 78.6* 78.1* 78.3*  PLT 216 213 184   Cardiac Enzymes:   No results for input(s): CKTOTAL, CKMB, CKMBINDEX, TROPONINI in the last 168 hours. BNP (last 3 results) No results for input(s): BNP in the last 8760 hours.  ProBNP (  last 3 results) No results for input(s): PROBNP in the last 8760 hours.  CBG: Recent Labs  Lab 05/21/18 1727 05/21/18 1957 05/22/18 0001 05/22/18 0355 05/22/18 0754  GLUCAP 133* 90 93 115* 84    No results found for this or any previous visit (from the past 240 hour(s)).   Studies: No results found.  Scheduled Meds: . insulin aspart  0-9 Units Subcutaneous TID WC  . levothyroxine  50 mcg Intravenous Daily  . sodium chloride flush  3 mL Intravenous Q12H    Continuous Infusions: . dextrose 5% lactated ringers    . magnesium sulfate 1 - 4 g bolus IVPB    . potassium chloride       Time spent: I have personally reviewed and  interpreted on  05/22/2018 daily labs,  imagings as discussed above under date review session and assessment and plans.  I reviewed all nursing notes, pharmacy notes, consultant notes,  vitals, pertinent old records  I have discussed plan of care as described above with RN , patient  on 05/22/2018   Albertine Grates MD, PhD  [facility] Pager [phone]. If 7PM-7AM, please contact night-coverage at [email or web address], password [facility] 05/22/2018, 8:19 AM  LOS: 2 days

## 2018-05-22 NOTE — TOC Progression Note (Addendum)
Transition of Care [facility]) - Progression Note    Patient Details  Name: Dana Saunders MRN: [id number] Date of Birth: Jul 07, 1930  Transition [facility]) CM/SW Contact  Nadene Rubins Adria Devon, RN Phone Number: 05/22/2018, 2:59 PM  Clinical Narrative:     Discussed discharge planning with patient at bedside. Patient from home lives with daughter [name] . [name] called her mother while NCM in room. Patient and [name] understand PT recommendations are home health PT also 24 hour supervision.   Patient has had home health in past but forgets the name of agency. Provided patient with Medicare.gov list of home health agencies.   Patient request NCM to call her niece [name] [phone]. Patient and [name] are sure [name] will know the name of the home health agency they had in past and they would like same.  [name] [phone] and left voicemail, awaiting call back.    Will need orders for HHPT and HHRN with instructions for G tube and wound care and face to face. Also orders for 3 in 1 .  [name] returned call. Patient had [facility] and would like [facility] again. Called Tiffany with [facility]. Tiffany will follow along.   Patient has walker and shower chair at home. [name] does want patient to have 3 in 1  Expected Discharge Plan: Home w Home Health Services Barriers to Discharge: Continued Medical Work up  Expected Discharge Plan and Services Expected Discharge Plan: Home w Home Health Services   Discharge Planning Services: CM Consult Post Acute Care Choice: Home Health Living arrangements for the past 2 months: Single Family Home                 DME Arranged: 3-N-1 DME Agency: [facility] HH Arranged: PT     Social Determinants of Health (SDOH) Interventions    Readmission Risk Interventions No flowsheet data found.

## 2018-05-22 NOTE — Progress Notes (Signed)
Subjective No acute events. Feeling reasonably well. Pain well controlled - some incisional soreness, denies any other pains. Denies n/v. G tube in place to gravity. Denies flatus/BM yet.  Objective: Vital signs in last 24 hours: Temp:  [98 F (36.7 C)-98.4 F (36.9 C)] 98 F (36.7 C) (03/24 0356) Pulse Rate:  [80-85] 85 (03/24 0356) Resp:  [17-18] 17 (03/24 0356) BP: (138-178)/(71-78) 138/78 (03/24 0356) SpO2:  [99 %-100 %] 99 % (03/24 0356) Last BM Date: 05/19/18  Intake/Output from previous day: 03/23 0701 - 03/24 0700 In: 1138.7 [I.V.:1138.7] Out: 2650 [Urine:2200; Drains:450] Intake/Output this shift: No intake/output data recorded.  Gen: NAD, comfortable CV: RRR Pulm: Normal work of breathing Abd: Soft, approp ttp around incision, nothing elsewhere. G tube in place to gravity. Ext: SCDs in place  Lab Results: CBC  Recent Labs    05/21/18 0216 05/22/18 0136  WBC 15.6* 14.4*  HGB 12.5 11.5*  HCT 34.9* 30.7*  PLT 213 184   BMET Recent Labs    05/21/18 0216 05/22/18 0136  NA 132* 132*  K 3.4* 2.8*  CL 98 99  CO2 22 21*  GLUCOSE 192* 88  BUN 7* <5*  CREATININE 0.58 0.44  CALCIUM 8.2* 7.3*   PT/INR No results for input(s): LABPROT, INR in the last 72 hours. ABG No results for input(s): PHART, HCO3 in the last 72 hours.  Invalid input(s): PCO2, PO2  Studies/Results:  Anti-infectives: Anti-infectives (From admission, onward)   None       Assessment/Plan: Patient Active Problem List   Diagnosis Date Noted  . Gastric volvulus 05/20/2018  . Hyponatremia 05/20/2018  . Elevated lipase 05/20/2018  . Diabetes mellitus type 2, noninsulin dependent (HCC) 05/20/2018  . Hyperlipidemia 05/20/2018  . Benign essential HTN 05/20/2018   s/p Procedure(s): EXPLORATORY LAPAROTOMY Insertion Of Gastrostomy Tube and Repair of Volvulus 05/20/2018  -Await return of bowel function; continue NPO - may have ice chips, MIVF -Continue G tube to gravity  drainage -Ok to remove Foley from bladder -PT/OT; ambulate -PPx: SCDs; ok for prophylactic chemical dvt ppx   LOS: 2 days   Stephanie Coup. Cliffton Asters, M.D. [facility]

## 2018-05-22 NOTE — Evaluation (Signed)
Physical Therapy Evaluation Patient Details Name: Dana Saunders MRN: [id number] DOB: [birth year] Today's Date: 05/22/2018   History of Present Illness  Pt is an 86 y/o female admitted secondary to gastric volvulus. Pt is s/p exploratory laparotomy with insertion for Gastrostomy tube and repair of volvulus. PMH includes HTN and DM.   Clinical Impression  Pt is s/p surgery above with deficits below. Pt with very slow, guarded gait and required min to min guard A for mobility using RW. Distance limited secondary to fatigue. Pt reports daughter can assist as needed and would like to go home at d/c. Will continue to follow acutely to maximize functional mobility independence and safety.     Follow Up Recommendations Home health PT;Supervision/Assistance - 24 hour    Equipment Recommendations  3in1 (PT)    Recommendations for Other Services       Precautions / Restrictions Precautions Precautions: Fall;Other (comment) Precaution Comments: G tube Restrictions Weight Bearing Restrictions: No      Mobility  Bed Mobility               General bed mobility comments: In recliner upon entry.   Transfers Overall transfer level: Needs assistance Equipment used: Rolling walker (2 wheeled) Transfers: Sit to/from Stand Sit to Stand: Min assist         General transfer comment: Heavy Min A for lift assist and steadying to stand. Cues for safe hand placement.   Ambulation/Gait Ambulation/Gait assistance: Min guard Gait Distance (Feet): 20 Feet Assistive device: Rolling walker (2 wheeled) Gait Pattern/deviations: Step-through pattern;Decreased stride length;Trunk flexed Gait velocity: Decreased    General Gait Details: Very slow, guarded gait. Pt fatiguing easily, so distance limited to within the room. Cues for sequencing using RW. Pt reports gait is slow at baseline.   Stairs            Wheelchair Mobility    Modified Rankin (Stroke Patients Only)        Balance Overall balance assessment: Needs assistance Sitting-balance support: No upper extremity supported;Feet supported Sitting balance-Leahy Scale: Fair     Standing balance support: Bilateral upper extremity supported;During functional activity Standing balance-Leahy Scale: Poor Standing balance comment: Reliant on BUE support.                              Pertinent Vitals/Pain Pain Assessment: Faces Faces Pain Scale: Hurts little more Pain Location: abdomen Pain Descriptors / Indicators: Grimacing;Guarding Pain Intervention(s): Limited activity within patient's tolerance;Monitored during session;Repositioned    Home Living Family/patient expects to be discharged to:: Private residence Living Arrangements: Children Available Help at Discharge: Family;Available 24 hours/day Type of Home: Other(Comment)(condo) Home Access: Level entry     Home Layout: One level Home Equipment: Walker - 4 wheels;Shower seat      Prior Function Level of Independence: Needs assistance   Gait / Transfers Assistance Needed: Reports she used rollator for ambulation and would take rest breaks as needed  ADL's / Homemaking Assistance Needed: Reports daughter has been helping with ADLs since she had gotten sick.         Hand Dominance        Extremity/Trunk Assessment   Upper Extremity Assessment Upper Extremity Assessment: Defer to OT evaluation    Lower Extremity Assessment Lower Extremity Assessment: Generalized weakness    Cervical / Trunk Assessment Cervical / Trunk Assessment: Normal  Communication   Communication: No difficulties  Cognition Arousal/Alertness: Awake/alert Behavior During Therapy: WFL for tasks  assessed/performed Overall Cognitive Status: Within Functional Limits for tasks assessed                                        General Comments General comments (skin integrity, edema, etc.): Pt reports she prefers to go home and  have her daughter assist her.     Exercises     Assessment/Plan    PT Assessment Patient needs continued PT services  PT Problem List Decreased strength;Decreased activity tolerance;Decreased balance;Decreased knowledge of use of DME;Decreased mobility;Decreased knowledge of precautions;Pain       PT Treatment Interventions DME instruction;Functional mobility training;Gait training;Therapeutic activities;Therapeutic exercise;Balance training;Patient/family education    PT Goals (Current goals can be found in the Care Plan section)  Acute Rehab PT Goals Patient Stated Goal: to go home  PT Goal Formulation: With patient Time For Goal Achievement: 06/05/18 Potential to Achieve Goals: Good    Frequency Min 3X/week   Barriers to discharge        Co-evaluation               AM-PAC PT "6 Clicks" Mobility  Outcome Measure Help needed turning from your back to your side while in a flat bed without using bedrails?: A Little Help needed moving from lying on your back to sitting on the side of a flat bed without using bedrails?: A Lot Help needed moving to and from a bed to a chair (including a wheelchair)?: A Little Help needed standing up from a chair using your arms (e.g., wheelchair or bedside chair)?: A Little Help needed to walk in hospital room?: A Little Help needed climbing 3-5 steps with a railing? : A Lot 6 Click Score: 16    End of Session   Activity Tolerance: Patient limited by fatigue Patient left: in chair;with call bell/phone within reach;with chair alarm set Nurse Communication: Mobility status PT Visit Diagnosis: Unsteadiness on feet (R26.81);Muscle weakness (generalized) (M62.81);Pain Pain - part of body: (abdomen )    Time: 1856-3149 PT Time Calculation (min) (ACUTE ONLY): 16 min   Charges:   PT Evaluation $PT Eval Low Complexity: 1 Low          Dana Saunders, PT, DPT  Acute Rehabilitation Services  Pager: [phone] Office: [phone]   Dana Saunders 05/22/2018, 11:09 AM

## 2018-05-23 LAB — CBC WITH DIFFERENTIAL/PLATELET
ABS IMMATURE GRANULOCYTES: 0.07 10*3/uL (ref 0.00–0.07)
Basophils Absolute: 0 10*3/uL (ref 0.0–0.1)
Basophils Relative: 0 %
Eosinophils Absolute: 0 10*3/uL (ref 0.0–0.5)
Eosinophils Relative: 0 %
HCT: 30.9 % — ABNORMAL LOW (ref 36.0–46.0)
Hemoglobin: 11.6 g/dL — ABNORMAL LOW (ref 12.0–15.0)
Immature Granulocytes: 1 %
Lymphocytes Relative: 8 %
Lymphs Abs: 1 10*3/uL (ref 0.7–4.0)
MCH: 29.1 pg (ref 26.0–34.0)
MCHC: 37.5 g/dL — ABNORMAL HIGH (ref 30.0–36.0)
MCV: 77.4 fL — ABNORMAL LOW (ref 80.0–100.0)
Monocytes Absolute: 1.4 10*3/uL — ABNORMAL HIGH (ref 0.1–1.0)
Monocytes Relative: 12 %
NEUTROS ABS: 9.4 10*3/uL — AB (ref 1.7–7.7)
Neutrophils Relative %: 79 %
PLATELETS: 202 10*3/uL (ref 150–400)
RBC: 3.99 MIL/uL (ref 3.87–5.11)
RDW: 12.8 % (ref 11.5–15.5)
WBC: 11.9 10*3/uL — ABNORMAL HIGH (ref 4.0–10.5)
nRBC: 0 % (ref 0.0–0.2)

## 2018-05-23 LAB — BASIC METABOLIC PANEL
Anion gap: 8 (ref 5–15)
BUN: <5 mg/dL — ABNORMAL LOW (ref 8–23)
CO2: 23 mmol/L (ref 22–32)
Calcium: 7.7 mg/dL — ABNORMAL LOW (ref 8.9–10.3)
Chloride: 99 mmol/L (ref 98–111)
Creatinine, Ser: 0.43 mg/dL — ABNORMAL LOW (ref 0.44–1.00)
GFR calc Af Amer: >60 mL/min (ref >60)
Glucose, Bld: 170 mg/dL — ABNORMAL HIGH (ref 70–99)
POTASSIUM: 3.2 mmol/L — AB (ref 3.5–5.1)
Sodium: 130 mmol/L — ABNORMAL LOW (ref 135–145)

## 2018-05-23 LAB — GLUCOSE, CAPILLARY
GLUCOSE-CAPILLARY: 118 mg/dL — AB (ref 70–99)
Glucose-Capillary: 153 mg/dL — ABNORMAL HIGH (ref 70–99)
Glucose-Capillary: 159 mg/dL — ABNORMAL HIGH (ref 70–99)
Glucose-Capillary: 172 mg/dL — ABNORMAL HIGH (ref 70–99)
Glucose-Capillary: 106 mg/dL — ABNORMAL HIGH (ref 70–99)
Glucose-Capillary: 81 mg/dL (ref 70–99)

## 2018-05-23 LAB — MAGNESIUM: Magnesium: 1.9 mg/dL (ref 1.7–2.4)

## 2018-05-23 MED ORDER — SODIUM CHLORIDE 0.9 % IV SOLN
INTRAVENOUS | Status: DC
  Administered 2018-05-24: 02:00:00 via INTRAVENOUS

## 2018-05-23 MED ORDER — HYDRALAZINE HCL 20 MG/ML IJ SOLN
10.0000 mg | Freq: Three times a day (TID) | INTRAMUSCULAR | Status: DC | PRN
  Administered 2018-05-23: 10 mg via INTRAVENOUS
  Filled 2018-05-23: qty 1

## 2018-05-23 MED ORDER — MAGNESIUM SULFATE 2 GM/50ML IV SOLN
2.0000 g | Freq: Once | INTRAVENOUS | Status: AC
  Administered 2018-05-23: 2 g via INTRAVENOUS
  Filled 2018-05-23: qty 50

## 2018-05-23 MED ORDER — POTASSIUM CHLORIDE 10 MEQ/100ML IV SOLN
10.0000 meq | INTRAVENOUS | Status: AC
  Administered 2018-05-23 (×4): 10 meq via INTRAVENOUS
  Filled 2018-05-23 (×2): qty 100

## 2018-05-23 MED ORDER — AMLODIPINE BESYLATE 5 MG PO TABS
5.0000 mg | ORAL_TABLET | Freq: Every day | ORAL | Status: DC
  Administered 2018-05-23 – 2018-05-24 (×2): 5 mg via ORAL
  Filled 2018-05-23 (×2): qty 1

## 2018-05-23 MED ORDER — WHITE PETROLATUM EX OINT
TOPICAL_OINTMENT | CUTANEOUS | Status: AC
  Administered 2018-05-24: 0.2
  Filled 2018-05-23: qty 28.35

## 2018-05-23 NOTE — Progress Notes (Signed)
Physical Therapy Treatment Patient Details Name: Dana Saunders MRN: [id number] DOB: 10-Oct-1930 Today's Date: 05/23/2018    History of Present Illness Pt is an 85 y/o female admitted secondary to gastric volvulus. Pt is s/p exploratory laparotomy with insertion for Gastrostomy tube and repair of volvulus. PMH includes HTN and DM.     PT Comments    Continuing work on functional mobility and activity tolerance; Improving activity tolerance, with much improved gait distance today; Ms Eckersley seemed pleased with her walk; Will need to work on bed mobility over next few sessions  Follow Up Recommendations  Home health PT;Supervision/Assistance - 24 hour     Equipment Recommendations  3in1 (PT)    Recommendations for Other Services       Precautions / Restrictions Precautions Precautions: Fall;Other (comment) Precaution Comments: G tube    Mobility  Bed Mobility                  Transfers Overall transfer level: Needs assistance Equipment used: Rolling walker (2 wheeled) Transfers: Sit to/from Stand Sit to Stand: Min assist         General transfer comment: assist to power up and stabilize  Ambulation/Gait Ambulation/Gait assistance: Min guard Gait Distance (Feet): 80 Feet Assistive device: Rolling walker (2 wheeled) Gait Pattern/deviations: Step-through pattern;Decreased stride length;Trunk flexed Gait velocity: Decreased    General Gait Details: Slow gait, but pt states her walking is better than yesterday; very slow, and trunk flexed; cues to try to work on more upright posture, and self-monitor for activity toleranc   Stairs             Wheelchair Mobility    Modified Rankin (Stroke Patients Only)       Balance             Standing balance-Leahy Scale: Poor Standing balance comment: Reliant on BUE support.                             Cognition Arousal/Alertness: Awake/alert Behavior During Therapy: WFL for tasks  assessed/performed Overall Cognitive Status: Within Functional Limits for tasks assessed                                        Exercises      General Comments        Pertinent Vitals/Pain Pain Assessment: No/denies pain    Home Living                      Prior Function            PT Goals (current goals can now be found in the care plan section) Acute Rehab PT Goals Patient Stated Goal: to go home  PT Goal Formulation: With patient Time For Goal Achievement: 06/05/18 Potential to Achieve Goals: Good Progress towards PT goals: Progressing toward goals    Frequency    Min 3X/week      PT Plan Current plan remains appropriate    Co-evaluation              AM-PAC PT "6 Clicks" Mobility   Outcome Measure  Help needed turning from your back to your side while in a flat bed without using bedrails?: A Little Help needed moving from lying on your back to sitting on the side of a flat bed without using bedrails?: A  Lot Help needed moving to and from a bed to a chair (including a wheelchair)?: A Little Help needed standing up from a chair using your arms (e.g., wheelchair or bedside chair)?: A Little Help needed to walk in hospital room?: A Little Help needed climbing 3-5 steps with a railing? : A Lot 6 Click Score: 16    End of Session Equipment Utilized During Treatment: Gait belt(at axillae) Activity Tolerance: Patient tolerated treatment well Patient left: in chair;with call bell/phone within reach;with chair alarm set Nurse Communication: Mobility status PT Visit Diagnosis: Unsteadiness on feet (R26.81);Muscle weakness (generalized) (M62.81);Pain Pain - part of body: (abdomen )     Time: 2500-3704 PT Time Calculation (min) (ACUTE ONLY): 21 min  Charges:  $Gait Training: 8-22 mins                     Van Clines, [place]  Acute Rehabilitation Services Pager [phone] Office [phone]    Levi Aland 05/23/2018, 12:52  PM

## 2018-05-23 NOTE — Progress Notes (Signed)
  [facility] Progress Note  3 Days Post-Op  Subjective: CC-  Sitting up in chair. Just finished working with therapy. Overall feeling well. Abdominal pain well controlled. Denies n/v. No flatus or BM, but states that she feels hungry.  Objective: Vital signs in last 24 hours: Temp:  [98.4 F (36.9 C)-99.4 F (37.4 C)] 99.1 F (37.3 C) (03/25 0500) Pulse Rate:  [72-78] 72 (03/24 2019) Resp:  [16-18] 16 (03/25 0500) BP: (154-180)/(67-81) 180/81 (03/25 0500) SpO2:  [95 %-98 %] 98 % (03/25 0500) Last BM Date: 05/19/18  Intake/Output from previous day: 03/24 0701 - 03/25 0700 In: 2073 [I.V.:1523; IV Piggyback:550] Out: 1850 [Urine:1575; Drains:275] Intake/Output this shift: No intake/output data recorded.  PE: Gen:  Alert, NAD, pleasant HEENT: EOM's intact, pupils equal and round Pulm:  effort normal Abd: Soft, NT/ND, +BS, midline incision cdi with honeycomb in place with dried blood, G-tube site cdi Skin: no rashes noted, warm and dry  Lab Results:  Recent Labs    05/22/18 0136 05/23/18 0153  WBC 14.4* 11.9*  HGB 11.5* 11.6*  HCT 30.7* 30.9*  PLT 184 202   BMET Recent Labs    05/22/18 0136 05/23/18 0153  NA 132* 130*  K 2.8* 3.2*  CL 99 99  CO2 21* 23  GLUCOSE 88 170*  BUN <5* <5*  CREATININE 0.44 0.43*  CALCIUM 7.3* 7.7*   PT/INR No results for input(s): LABPROT, INR in the last 72 hours. CMP     Component Value Date/Time   NA 130 (L) 05/23/2018 0153   K 3.2 (L) 05/23/2018 0153   CL 99 05/23/2018 0153   CO2 23 05/23/2018 0153   GLUCOSE 170 (H) 05/23/2018 0153   BUN <5 (L) 05/23/2018 0153   CREATININE 0.43 (L) 05/23/2018 0153   CALCIUM 7.7 (L) 05/23/2018 0153   PROT 7.3 05/21/2018 0216   ALBUMIN 3.5 05/21/2018 0216   AST 23 05/21/2018 0216   ALT 13 05/21/2018 0216   ALKPHOS 44 05/21/2018 0216   BILITOT 0.8 05/21/2018 0216   GFRNONAA >60 05/23/2018 0153   GFRAA >60 05/23/2018 0153   Lipase     Component Value Date/Time   LIPASE 28 05/22/2018 0136       Studies/Results: No results found.  Anti-infectives: Anti-infectives (From admission, onward)   None       Assessment/Plan DM HTN HLD Hypothyroidism  Gastric volvulus S/p EXPLORATORY LAPAROTOMY, REDUCTION OF GASTRIC VOLVULUS,  GASTROSTOMY TUBE PLACEMENT 24FR [place] 3/22 Dr. Janee Morn - POD 3 - Clamp G-tube and allow sips of clears from the floor. If patient become nauseated or has increased abdominal pain unclamp G-tube tube. K/Mag being replaced. Continue mobilization/therapies.   ID - none FEN - IVF, sips clears from the floor VTE - SCDs, ok for chemical DVT prophylaxis from surgical standpoint Foley - out 3/24 Follow up - Dr. Janee Morn    LOS: 3 days    Franne Forts , [facility] 05/23/2018, 8:37 AM Pager: [phone] Mon-Thurs 7:00 am-4:30 pm Fri 7:00 am -11:30 AM Sat-Sun 7:00 am-11:30 am

## 2018-05-23 NOTE — Evaluation (Signed)
Occupational Therapy Evaluation Patient Details Name: Dana Saunders MRN: [id number] DOB: [birth year] Today's Date: 05/23/2018    History of Present Illness Pt is an 85 y/o female admitted secondary to gastric volvulus. Pt is s/p exploratory laparotomy with insertion for Gastrostomy tube and repair of volvulus. PMH includes HTN and DM.    Clinical Impression   Pt was admitted for the above.  She is limited by pain for adls, but ambulated to bathroom and completed tasks with extra time and assist for LB. Her daughter is a Runner, broadcasting/film/video, and will be with her 24/7. Will follow her with min A level goals. Educated on energy conservation and showed reacher, which she does not have.  Daughter can assist as needed, also.      Follow Up Recommendations  Home health OT;Supervision/Assistance - 24 hour    Equipment Recommendations  None recommended by OT    Recommendations for Other Services       Precautions / Restrictions Precautions Precautions: Fall;Other (comment) Precaution Comments: G tube Restrictions Weight Bearing Restrictions: No      Mobility Bed Mobility               General bed mobility comments: oob  Transfers   Equipment used: Rolling walker (2 wheeled)   Sit to Stand: Min assist         General transfer comment: assist to power up and stabilize    Balance             Standing balance-Leahy Scale: Poor Standing balance comment: Reliant on BUE support.                            ADL either performed or assessed with clinical judgement   ADL Overall ADL's : Needs assistance/impaired Eating/Feeding: Independent   Grooming: Oral care;Supervision/safety;Standing   Upper Body Bathing: Set up   Lower Body Bathing: Moderate assistance   Upper Body Dressing : Minimal assistance Upper Body Dressing Details (indicate cue type and reason): lines Lower Body Dressing: Maximal assistance   Toilet Transfer: Minimal  assistance;BSC;RW;Ambulation   Toileting- Clothing Manipulation and Hygiene: Minimal assistance;Sit to/from stand         General ADL Comments: pt was in bathroom when OT arrived. Assisted with ADL then returned to chair.  educated on energy conservation and AE but did not use today     Financial controller      Pertinent Vitals/Pain Pain Assessment: Faces Faces Pain Scale: Hurts a little bit Pain Location: abdomen Pain Descriptors / Indicators: Grimacing;Guarding Pain Intervention(s): Limited activity within patient's tolerance;Monitored during session;Repositioned     Hand Dominance     Extremity/Trunk Assessment Upper Extremity Assessment Upper Extremity Assessment: Generalized weakness           Communication Communication Communication: No difficulties   Cognition Arousal/Alertness: Awake/alert Behavior During Therapy: WFL for tasks assessed/performed Overall Cognitive Status: Within Functional Limits for tasks assessed                                     General Comments       Exercises     Shoulder Instructions      Home Living Family/patient expects to be discharged to:: Private residence Living Arrangements: Children Available Help at Discharge: Family;Available 24 hours/day  Bathroom Shower/Tub: Chief Strategy Officer: Standard     Home Equipment: Bedside commode;Shower seat;Grab bars - tub/shower          Prior Teacher, adult education / Transfers Assistance Needed: Reports she used rollator for ambulation and would take rest breaks as needed ADL's / Homemaking Assistance Needed: Reports daughter has been helping ( min A ) with ADLs since she had gotten sick.             OT Problem List: Decreased strength;Decreased activity tolerance;Impaired balance (sitting and/or standing);Pain;Decreased knowledge of use of DME or AE      OT Treatment/Interventions:  Self-care/ADL training;DME and/or AE instruction;Therapeutic activities;Patient/family education;Balance training    OT Goals(Current goals can be found in the care plan section) Acute Rehab OT Goals Patient Stated Goal: to go home  OT Goal Formulation: With patient Time For Goal Achievement: 06/06/18 Potential to Achieve Goals: Good ADL Goals Pt Will Transfer to Toilet: with supervision;ambulating;bedside commode Additional ADL Goal #1: pt will complete adl with min A using reacher for pants and LB bathing (no socks/shoes Additional ADL Goal #2: pt will initiate at least oe rest break for energy conservation  OT Frequency: Min 2X/week   Barriers to D/C:            Co-evaluation              AM-PAC OT "6 Clicks" Daily Activity     Outcome Measure Help from another person eating meals?: None(npo) Help from another person taking care of personal grooming?: A Little Help from another person toileting, which includes using toliet, bedpan, or urinal?: A Little Help from another person bathing (including washing, rinsing, drying)?: A Lot Help from another person to put on and taking off regular upper body clothing?: A Little Help from another person to put on and taking off regular lower body clothing?: A Lot 6 Click Score: 17   End of Session    Activity Tolerance: Patient tolerated treatment well Patient left: in chair;with call bell/phone within reach;with chair alarm set  OT Visit Diagnosis: Unsteadiness on feet (R26.81);Muscle weakness (generalized) (M62.81)                Time: 0814-4818 OT Time Calculation (min): 30 min Charges:  OT General Charges $OT Visit: 1 Visit OT Evaluation $OT Eval Low Complexity: 1 Low OT Treatments $Self Care/Home Management : 8-22 mins  Marica Otter, OTR/L Acute Rehabilitation Services [phone] [facility] pager [phone] office 05/23/2018  Thena Devora 05/23/2018, 9:45 AM

## 2018-05-23 NOTE — Progress Notes (Signed)
PROGRESS NOTE    Dana Saunders  JHE:[id number] DOB: 03/04/1930 DOA: 05/20/2018 PCP: Renford Dills, MD   Brief Narrative: 85 year old with past medical history significant for diabetes type 2, hypertension, hyperlipidemia who presented with abdominal pain, cough, shortness of breath.  CT showed gastric volvulus. Patient underwent emergent endoscopy and admission which showed changes of gastric volvulus with necrotic gastric mucosa.  This was discussed with Dr. Janee Morn, patient was taken to the OR for decompression, patient underwent exploratory laparotomy, reduction of gastric volvulus, gastrostomy tube placement 24 [place] on 3/22.    Assessment & Plan:   Principal Problem:   Gastric volvulus Active Problems:   Hyponatremia   Elevated lipase   Diabetes mellitus type 2, noninsulin dependent (HCC)   Hyperlipidemia   Benign essential HTN   Malnutrition of moderate degree   Hypokalemia   Hypomagnesemia  1-Gastric volvulus: Status post urgent endoscopy then exploratory laparoscopy with reduction and gastrostomy tube placement on 05/20/2018 Surgery following Plan to clamp gastric tube and allows clear to patient today.  2-Hyponatremia: Hold hydrochlorothiazide. On IV fluids.  Hypokalemia/hypomagnesemia Replete IV kcl.   Non-insulin-dependent type 2 diabetic hold metformin and Januvia. Continue with a sliding scale insulin.  HTN;  Hydrochlorothiazide and losartan in the setting of hyponatremia and poor oral intake. Will start IV hydralazine.    Nutrition Problem: Moderate Malnutrition Etiology: acute illness    Signs/Symptoms: moderate muscle depletion, moderate fat depletion, energy intake < or equal to 50% for > or equal to 5 days    Interventions: Refer to RD note for recommendations  Estimated body mass index is 25.69 kg/m as calculated from the following:   Height as of this encounter: 5\' 3"  (1.6 m).   Weight as of this encounter: 65.8 kg.   DVT  prophylaxis:  Code Status: Full code Family Communication: Care discussed with patient Disposition Plan: Home when cleared by surgery  Consultants:   General surgery   Procedures:   3/22 status post urgent EGD then exploratory laparoscopy with reduction and gastrostomy tube placement  Antimicrobials:  None  Subjective: She denies worsening abdominal pain.  Her appetite is coming back.  Objective: Vitals:   05/22/18 0356 05/22/18 1616 05/22/18 2019 05/23/18 0500  BP: 138/78 (!) 154/73 (!) 168/67 (!) 180/81  Pulse: 85 78 72   Resp: 17 18 16 16   Temp: 98 F (36.7 C) 98.4 F (36.9 C) 99.4 F (37.4 C) 99.1 F (37.3 C)  TempSrc: Oral Oral Oral Oral  SpO2: 99% 98% 95% 98%  Weight:      Height:        Intake/Output Summary (Last 24 hours) at 05/23/2018 1212 Last data filed at 05/23/2018 0500 Gross per 24 hour  Intake 2072.96 ml  Output 1700 ml  Net 372.96 ml   Filed Weights   05/20/18 1218  Weight: 65.8 kg    Examination:  General exam: Appears calm and comfortable  Respiratory system: Clear to auscultation. Respiratory effort normal. Cardiovascular system: S1 & S2 heard, RRR. No JVD, murmurs, rubs, gallops or clicks. No pedal edema. Gastrointestinal system: Abdomen soft, incision cover, G-tube in place Central nervous system: Alert and oriented. No focal neurological deficits. Extremities: Symmetric 5 x 5 power. Skin: No rashes, lesions or ulcers   Data Reviewed: I have personally reviewed following labs and imaging studies  CBC: Recent Labs  Lab 05/20/18 1222 05/21/18 0216 05/22/18 0136 05/23/18 0153  WBC 11.9* 15.6* 14.4* 11.9*  NEUTROABS 10.4*  --  11.4* 9.4*  HGB 14.5 12.5 11.5*  11.6*  HCT 40.0 34.9* 30.7* 30.9*  MCV 78.6* 78.1* 78.3* 77.4*  PLT 216 213 184 202   Basic Metabolic Panel: Recent Labs  Lab 05/20/18 1222 05/21/18 0216 05/22/18 0136 05/23/18 0153  NA 130* 132* 132* 130*  K 3.7 3.4* 2.8* 3.2*  CL 92* 98 99 99  CO2 21* 22 21*  23  GLUCOSE 304* 192* 88 170*  BUN 11 7* <5* <5*  CREATININE 0.73 0.58 0.44 0.43*  CALCIUM 9.6 8.2* 7.3* 7.7*  MG  --   --  1.3* 1.9   GFR: Estimated Creatinine Clearance: 46.9 mL/min (A) (by C-G formula based on SCr of 0.43 mg/dL (L)). Liver Function Tests: Recent Labs  Lab 05/20/18 1222 05/21/18 0216  AST 25 23  ALT 11 13  ALKPHOS 58 44  BILITOT 0.8 0.8  PROT 9.5* 7.3  ALBUMIN 4.7 3.5   Recent Labs  Lab 05/20/18 1222 05/22/18 0136  LIPASE 153* 28   No results for input(s): AMMONIA in the last 168 hours. Coagulation Profile: No results for input(s): INR, PROTIME in the last 168 hours. Cardiac Enzymes: No results for input(s): CKTOTAL, CKMB, CKMBINDEX, TROPONINI in the last 168 hours. BNP (last 3 results) No results for input(s): PROBNP in the last 8760 hours. HbA1C: No results for input(s): HGBA1C in the last 72 hours. CBG: Recent Labs  Lab 05/22/18 2016 05/22/18 2328 05/23/18 0455 05/23/18 0755 05/23/18 1154  GLUCAP 135* 157* 153* 159* 172*   Lipid Profile: No results for input(s): CHOL, HDL, LDLCALC, TRIG, CHOLHDL, LDLDIRECT in the last 72 hours. Thyroid Function Tests: No results for input(s): TSH, T4TOTAL, FREET4, T3FREE, THYROIDAB in the last 72 hours. Anemia Panel: No results for input(s): VITAMINB12, FOLATE, FERRITIN, TIBC, IRON, RETICCTPCT in the last 72 hours. Sepsis Labs: Recent Labs  Lab 05/20/18 2204  LATICACIDVEN 2.2*    No results found for this or any previous visit (from the past 240 hour(s)).       Radiology Studies: No results found.      Scheduled Meds: . insulin aspart  0-9 Units Subcutaneous TID WC  . levothyroxine  50 mcg Intravenous Daily  . sodium chloride flush  3 mL Intravenous Q12H   Continuous Infusions: . sodium chloride Stopped (05/22/18 1621)     LOS: 3 days    Time spent: 35 minutes.     Alba Cory, MD [facility] Pager [phone]  If 7PM-7AM, please contact night-coverage  [email or web address] Password [email or web address] 05/23/2018, 12:12 PM

## 2018-05-24 LAB — CBC
HCT: 32 % — ABNORMAL LOW (ref 36.0–46.0)
HEMOGLOBIN: 11.5 g/dL — AB (ref 12.0–15.0)
MCH: 28 pg (ref 26.0–34.0)
MCHC: 35.9 g/dL (ref 30.0–36.0)
MCV: 78 fL — ABNORMAL LOW (ref 80.0–100.0)
Platelets: 231 10*3/uL (ref 150–400)
RBC: 4.1 MIL/uL (ref 3.87–5.11)
RDW: 12.8 % (ref 11.5–15.5)
WBC: 9.5 10*3/uL (ref 4.0–10.5)
nRBC: 0 % (ref 0.0–0.2)

## 2018-05-24 LAB — BASIC METABOLIC PANEL
Anion gap: 9 (ref 5–15)
CO2: 21 mmol/L — ABNORMAL LOW (ref 22–32)
CREATININE: 0.44 mg/dL (ref 0.44–1.00)
Calcium: 8.1 mg/dL — ABNORMAL LOW (ref 8.9–10.3)
Chloride: 100 mmol/L (ref 98–111)
GFR calc Af Amer: >60 mL/min (ref >60)
GFR calc non Af Amer: >60 mL/min (ref >60)
Glucose, Bld: 124 mg/dL — ABNORMAL HIGH (ref 70–99)
Potassium: 3.6 mmol/L (ref 3.5–5.1)
SODIUM: 130 mmol/L — AB (ref 135–145)

## 2018-05-24 LAB — GLUCOSE, CAPILLARY
Glucose-Capillary: 99 mg/dL (ref 70–99)
Glucose-Capillary: 87 mg/dL (ref 70–99)
Glucose-Capillary: 143 mg/dL — ABNORMAL HIGH (ref 70–99)
Glucose-Capillary: 127 mg/dL — ABNORMAL HIGH (ref 70–99)
Glucose-Capillary: 305 mg/dL — ABNORMAL HIGH (ref 70–99)
Glucose-Capillary: 147 mg/dL — ABNORMAL HIGH (ref 70–99)

## 2018-05-24 MED ORDER — ENSURE ENLIVE PO LIQD
237.0000 mL | Freq: Two times a day (BID) | ORAL | Status: DC
  Administered 2018-05-24 – 2018-05-28 (×5): 237 mL via ORAL

## 2018-05-24 MED ORDER — ADULT MULTIVITAMIN W/MINERALS CH
1.0000 | ORAL_TABLET | Freq: Every day | ORAL | Status: DC
  Administered 2018-05-24 – 2018-05-28 (×5): 1 via ORAL
  Filled 2018-05-24 (×5): qty 1

## 2018-05-24 MED ORDER — POTASSIUM CHLORIDE CRYS ER 20 MEQ PO TBCR
40.0000 meq | EXTENDED_RELEASE_TABLET | Freq: Once | ORAL | Status: AC
  Administered 2018-05-24: 40 meq via ORAL
  Filled 2018-05-24: qty 2

## 2018-05-24 MED ORDER — MAGNESIUM OXIDE 400 (241.3 MG) MG PO TABS
200.0000 mg | ORAL_TABLET | Freq: Two times a day (BID) | ORAL | Status: DC
  Administered 2018-05-24 – 2018-05-28 (×9): 200 mg via ORAL
  Filled 2018-05-24 (×9): qty 1

## 2018-05-24 NOTE — Progress Notes (Signed)
Occupational Therapy Treatment Patient Details Name: Dana Saunders MRN: [id number] DOB: January 23, 1931 Today's Date: 05/24/2018    History of present illness Pt is an 85 y/o female admitted secondary to gastric volvulus. Pt is s/p exploratory laparotomy with insertion for Gastrostomy tube and repair of volvulus. PMH includes HTN and DM.    OT comments  Continued education on AE and energy conservation  Follow Up Recommendations  Home health OT;Supervision/Assistance - 24 hour    Equipment Recommendations  None recommended by OT    Recommendations for Other Services      Precautions / Restrictions Precautions Precautions: Fall;Other (comment) Precaution Comments: G tube Restrictions Weight Bearing Restrictions: No       Mobility Bed Mobility               General bed mobility comments: oob  Transfers   Equipment used: Rolling walker (2 wheeled)   Sit to Stand: Min assist         General transfer comment: light assistance to stand and steady.  Assist to control descent also    Balance                                           ADL either performed or assessed with clinical judgement   ADL               Lower Body Bathing: Min guard;Sit to/from stand;With adaptive equipment       Lower Body Dressing: Moderate assistance;Maximal assistance;With adaptive equipment;Sit to/from stand                 General ADL Comments: used reacher to simulate LB bathing and donning pants (pillowcase) as pt had already completed ADL.  Reviewed energy conservation and stood to reposition in chair     Vision       Perception     Praxis      Cognition Arousal/Alertness: Awake/alert Behavior During Therapy: WFL for tasks assessed/performed Overall Cognitive Status: Within Functional Limits for tasks assessed                                          Exercises     Shoulder Instructions       General Comments       Pertinent Vitals/ Pain       Pain Assessment: No/denies pain  Home Living                                          Prior Functioning/Environment              Frequency  Min 2X/week        Progress Toward Goals  OT Goals(current goals can now be found in the care plan section)  Progress towards OT goals: Progressing toward goals     Plan      Co-evaluation                 AM-PAC OT "6 Clicks" Daily Activity     Outcome Measure   Help from another person eating meals?: None Help from another person taking care of personal grooming?: A Little Help from another person toileting, which includes using  toliet, bedpan, or urinal?: A Little Help from another person bathing (including washing, rinsing, drying)?: A Little Help from another person to put on and taking off regular upper body clothing?: A Little Help from another person to put on and taking off regular lower body clothing?: A Lot 6 Click Score: 18    End of Session    OT Visit Diagnosis: Unsteadiness on feet (R26.81);Muscle weakness (generalized) (M62.81)   Activity Tolerance Patient tolerated treatment well   Patient Left in chair;with call bell/phone within reach;with chair alarm set   Nurse Communication          Time: [phone] OT Time Calculation (min): 11 min  Charges: OT General Charges $OT Visit: 1 Visit OT Treatments $Self Care/Home Management : 8-22 mins  Marica Otter, OTR/L Acute Rehabilitation Services [phone] [facility] pager [phone] office 05/24/2018   Zenobia Kuennen 05/24/2018, 10:22 AM

## 2018-05-24 NOTE — Progress Notes (Signed)
PROGRESS NOTE    Dana Saunders  HWK:[id number] DOB: 06/11/1930 DOA: 05/20/2018 PCP: Renford Dills, MD   Brief Narrative: 85 year old with past medical history significant for diabetes type 2, hypertension, hyperlipidemia who presented with abdominal pain, cough, shortness of breath.  CT showed gastric volvulus. Patient underwent emergent endoscopy and admission which showed changes of gastric volvulus with necrotic gastric mucosa.  This was discussed with Dr. Janee Morn, patient was taken to the OR for decompression, patient underwent exploratory laparotomy, reduction of gastric volvulus, gastrostomy tube placement 24 [place] on 3/22.    Assessment & Plan:   Principal Problem:   Gastric volvulus Active Problems:   Hyponatremia   Elevated lipase   Diabetes mellitus type 2, noninsulin dependent (HCC)   Hyperlipidemia   Benign essential HTN   Malnutrition of moderate degree   Hypokalemia   Hypomagnesemia  1-Gastric volvulus: Status post urgent endoscopy then exploratory laparoscopy with reduction and gastrostomy tube placement on 05/20/2018 Surgery following Gastric tube was clamp 3-25. She started to pass flatus. Denies worsening abdominal pain.  Plan to advanced to soft diet.   2-Hyponatremia: Hold hydrochlorothiazide. Continue with IV fluids.   Hypokalemia/hypomagnesemia Replaced. Will start oral supplement to keep potassium above 4.   Non-insulin-dependent type 2 diabetic hold metformin and Januvia. Continue with a sliding scale insulin.  HTN;  Hydrochlorothiazide and losartan in the setting of hyponatremia and poor oral intake. On PRN hydralazine.  Start Norvasc.     Nutrition Problem: Moderate Malnutrition Etiology: acute illness    Signs/Symptoms: moderate muscle depletion, moderate fat depletion, energy intake < or equal to 50% for > or equal to 5 days    Interventions: Refer to RD note for recommendations  Estimated body mass index is 25.69 kg/m as  calculated from the following:   Height as of this encounter: 5\' 3"  (1.6 m).   Weight as of this encounter: 65.8 kg.   DVT prophylaxis:  Code Status: Full code Family Communication: Care discussed with patient Disposition Plan: Home when cleared by surgery  Consultants:   General surgery   Procedures:   3/22 status post urgent EGD then exploratory laparoscopy with reduction and gastrostomy tube placement  Antimicrobials:  None  Subjective: She denies worsening abdominal pain. Passing flatus.  Tolerated clear diet.  She was able to walk yesterday in the hall. I encourage her to continue to walk.   Objective: Vitals:   05/23/18 0500 05/23/18 1311 05/23/18 2003 05/24/18 0421  BP: (!) 180/81 (!) 179/79 (!) 144/67 (!) 156/78  Pulse:  69 88 84  Resp: 16 17 18 18   Temp: 99.1 F (37.3 C) 98.4 F (36.9 C) 98.7 F (37.1 C) 98.4 F (36.9 C)  TempSrc: Oral Oral Oral Oral  SpO2: 98% 99% 98% 97%  Weight:      Height:        Intake/Output Summary (Last 24 hours) at 05/24/2018 1001 Last data filed at 05/24/2018 0600 Gross per 24 hour  Intake 1670.25 ml  Output 2100 ml  Net -429.75 ml   Filed Weights   05/20/18 1218  Weight: 65.8 kg    Examination:  General exam: NAD Respiratory system: CTA Cardiovascular system: S 1, S 2 RRR Gastrointestinal system: BS present, soft, nt,G tube in place.  Central nervous system: non focal.  Extremities: symmetric power.  Skin: no rashes.    Data Reviewed: I have personally reviewed following labs and imaging studies  CBC: Recent Labs  Lab 05/20/18 1222 05/21/18 0216 05/22/18 0136 05/23/18 0153 05/24/18 9458  WBC 11.9* 15.6* 14.4* 11.9* 9.5  NEUTROABS 10.4*  --  11.4* 9.4*  --   HGB 14.5 12.5 11.5* 11.6* 11.5*  HCT 40.0 34.9* 30.7* 30.9* 32.0*  MCV 78.6* 78.1* 78.3* 77.4* 78.0*  PLT 216 213 184 202 231   Basic Metabolic Panel: Recent Labs  Lab 05/20/18 1222 05/21/18 0216 05/22/18 0136 05/23/18 0153 05/24/18 0213   NA 130* 132* 132* 130* 130*  K 3.7 3.4* 2.8* 3.2* 3.6  CL 92* 98 99 99 100  CO2 21* 22 21* 23 21*  GLUCOSE 304* 192* 88 170* 124*  BUN 11 7* <5* <5* <5*  CREATININE 0.73 0.58 0.44 0.43* 0.44  CALCIUM 9.6 8.2* 7.3* 7.7* 8.1*  MG  --   --  1.3* 1.9  --    GFR: Estimated Creatinine Clearance: 46.9 mL/min (by C-G formula based on SCr of 0.44 mg/dL). Liver Function Tests: Recent Labs  Lab 05/20/18 1222 05/21/18 0216  AST 25 23  ALT 11 13  ALKPHOS 58 44  BILITOT 0.8 0.8  PROT 9.5* 7.3  ALBUMIN 4.7 3.5   Recent Labs  Lab 05/20/18 1222 05/22/18 0136  LIPASE 153* 28   No results for input(s): AMMONIA in the last 168 hours. Coagulation Profile: No results for input(s): INR, PROTIME in the last 168 hours. Cardiac Enzymes: No results for input(s): CKTOTAL, CKMB, CKMBINDEX, TROPONINI in the last 168 hours. BNP (last 3 results) No results for input(s): PROBNP in the last 8760 hours. HbA1C: No results for input(s): HGBA1C in the last 72 hours. CBG: Recent Labs  Lab 05/23/18 1545 05/23/18 2001 05/23/18 2352 05/24/18 0413 05/24/18 0737  GLUCAP 106* 81 118* 99 87   Lipid Profile: No results for input(s): CHOL, HDL, LDLCALC, TRIG, CHOLHDL, LDLDIRECT in the last 72 hours. Thyroid Function Tests: No results for input(s): TSH, T4TOTAL, FREET4, T3FREE, THYROIDAB in the last 72 hours. Anemia Panel: No results for input(s): VITAMINB12, FOLATE, FERRITIN, TIBC, IRON, RETICCTPCT in the last 72 hours. Sepsis Labs: Recent Labs  Lab 05/20/18 2204  LATICACIDVEN 2.2*    No results found for this or any previous visit (from the past 240 hour(s)).       Radiology Studies: No results found.      Scheduled Meds: . amLODipine  5 mg Oral Daily  . insulin aspart  0-9 Units Subcutaneous TID WC  . levothyroxine  50 mcg Intravenous Daily  . sodium chloride flush  3 mL Intravenous Q12H   Continuous Infusions: . sodium chloride Stopped (05/22/18 1621)  . sodium chloride 75  mL/hr at 05/24/18 0147     LOS: 4 days    Time spent: 35 minutes.     Alba Cory, MD [facility] Pager [phone]  If 7PM-7AM, please contact night-coverage [email or web address] Password [email or web address] 05/24/2018, 10:01 AM

## 2018-05-24 NOTE — Progress Notes (Signed)
  [facility] Progress Note  4 Days Post-Op  Subjective: CC-  Sitting up in bed. Overall feeling well. Abdominal pain well controlled. Denies n/v -tolerating liquids without issue. Passing flatus, no bm yet, states that she feels hungry.  Objective: Vital signs in last 24 hours: Temp:  [98.4 F (36.9 C)-98.7 F (37.1 C)] 98.4 F (36.9 C) (03/26 0421) Pulse Rate:  [69-88] 84 (03/26 0421) Resp:  [17-18] 18 (03/26 0421) BP: (144-179)/(67-79) 156/78 (03/26 0421) SpO2:  [97 %-99 %] 97 % (03/26 0421) Last BM Date: 05/19/18  Intake/Output from previous day: 03/25 0701 - 03/26 0700 In: 1670.3 [P.O.:460; I.V.:1210.3] Out: 2100 [Urine:2025; Drains:75] Intake/Output this shift: No intake/output data recorded.  PE: Gen:  Alert, NAD, pleasant HEENT: EOM's intact, pupils equal and round Pulm:  effort normal Abd: Soft, NT/ND, +BS, midline incision cdi with honeycomb in place with dried blood, G-tube site cdi Skin: no rashes noted, warm and dry  Lab Results:  Recent Labs    05/23/18 0153 05/24/18 0213  WBC 11.9* 9.5  HGB 11.6* 11.5*  HCT 30.9* 32.0*  PLT 202 231   BMET Recent Labs    05/23/18 0153 05/24/18 0213  NA 130* 130*  K 3.2* 3.6  CL 99 100  CO2 23 21*  GLUCOSE 170* 124*  BUN <5* <5*  CREATININE 0.43* 0.44  CALCIUM 7.7* 8.1*   PT/INR No results for input(s): LABPROT, INR in the last 72 hours. CMP     Component Value Date/Time   NA 130 (L) 05/24/2018 0213   K 3.6 05/24/2018 0213   CL 100 05/24/2018 0213   CO2 21 (L) 05/24/2018 0213   GLUCOSE 124 (H) 05/24/2018 0213   BUN <5 (L) 05/24/2018 0213   CREATININE 0.44 05/24/2018 0213   CALCIUM 8.1 (L) 05/24/2018 0213   PROT 7.3 05/21/2018 0216   ALBUMIN 3.5 05/21/2018 0216   AST 23 05/21/2018 0216   ALT 13 05/21/2018 0216   ALKPHOS 44 05/21/2018 0216   BILITOT 0.8 05/21/2018 0216   GFRNONAA >60 05/24/2018 0213   GFRAA >60 05/24/2018 0213   Lipase     Component Value Date/Time   LIPASE 28  05/22/2018 0136       Studies/Results: No results found.  Anti-infectives: Anti-infectives (From admission, onward)   None       Assessment/Plan DM HTN HLD Hypothyroidism  Gastric volvulus S/p EXPLORATORY LAPAROTOMY, REDUCTION OF GASTRIC VOLVULUS,  GASTROSTOMY TUBE PLACEMENT 24FR MALECOT 3/22 Dr. Janee Morn - POD 4 - Clamp G-tube; advance to soft diet. K/Mag being replaced. Continue mobilization/therapies.   ID - none FEN - IVF, sips clears from the floor VTE - SCDs, ok for chemical DVT prophylaxis from surgical standpoint Foley - out 3/24 Follow up - Dr. Janee Morn    LOS: 4 days    Andria Meuse MD [facility] 05/24/2018, 8:41 AM Pager: [phone] Mon-Thurs 7:00 am-4:30 pm Fri 7:00 am -11:30 AM Sat-Sun 7:00 am-11:30 am

## 2018-05-24 NOTE — Progress Notes (Signed)
Nutrition Follow-up  RD working remotely.  DOCUMENTATION CODES:   Non-severe (moderate) malnutrition in context of acute illness/injury  INTERVENTION:   -Ensure Enlive po BID, each supplement provides 350 kcal and 20 grams of protein -MVI with minerals daily  NUTRITION DIAGNOSIS:   Moderate Malnutrition related to acute illness as evidenced by moderate muscle depletion, moderate fat depletion, energy intake < or equal to 50% for > or equal to 5 days.  Ongoing  GOAL:   Patient will meet greater than or equal to 90% of their needs  Progressing  MONITOR:   PO intake, Supplement acceptance, Labs, Weight trends, Skin, I & O's  REASON FOR ASSESSMENT:   Malnutrition Screening Tool    ASSESSMENT:   85 year old female with PMH of T2DM, HLD, HTN presented to ED with abdominal pain, nausea, and vomiting admitted with gastric volvulus. NG placed in ER for decompression. Patient s/p exploratory laparotomy and Gtube placement on 3/22  3/22- Procedure(s): EXPLORATORY LAPAROTOMY REDUCTION OF GASTRIC VOLVULUS GASTROSTOMY TUBE PLACEMENT 24FR [place] 3/25- G-tube clamped, clear liquid trialed 3/26- advanced to soft diet  Reviewed I/O's: -430 ml x 24 hours and -934 ml since admission  Drains: 75 ml x 24 hours  UOP: 2 L x 24 hours  Spoke with pt over phone, who is in good spirits and reports feeling better today. She confirms that she is hungry and ready to eat (she ordered breakfast of scrambled eggs, toast, and fruit prior to RD calling). Pt shares that she tolerated sips of clear liquids yesterday without difficulty and denies any nausea, vomiting, and abdominal pain currently.   Discussed importance of good meal and supplement intake to promote healing. Pt is amenable to Ensure supplements. Pt denied any further needs or questions regarding nutrition care plan at this time, however, expressed appreciation for RD contacting her.   Labs reviewed: Na: 130, CBGS: 81-118 (inpatient  orders for glycemic control are 0-9 units insulin aspart TID with meals).   Diet Order:   Diet Order            DIET SOFT Room service appropriate? Yes with Assist; Fluid consistency: Thin  Diet effective now              EDUCATION NEEDS:   Education needs have been addressed  Skin:  Skin Assessment: Skin Integrity Issues: Skin Integrity Issues:: Incisions Incisions: closed abdomen with honeycomb dressing  Last BM:  05/19/18  Height:   Ht Readings from Last 1 Encounters:  05/20/18 5\' 3"  (1.6 m)    Weight:   Wt Readings from Last 1 Encounters:  05/20/18 65.8 kg    Ideal Body Weight:  52.3 kg  BMI:  Body mass index is 25.69 kg/m.  Estimated Nutritional Needs:   Kcal:  1800-1975  Protein:  90-105 grams  Fluid:  >1.9L    Verlie Liotta A. Mayford Knife, RD, LDN, CDCES Registered Dietitian II Certified Diabetes Care and Education Specialist Pager: [phone] After hours Pager: [phone]

## 2018-05-24 NOTE — Discharge Instructions (Addendum)
[facility]      [facility], [place] [phone]  OPEN ABDOMINAL SURGERY: POST OP INSTRUCTIONS  Always review your discharge instruction sheet given to you by the facility where your surgery was performed.  IF YOU HAVE DISABILITY OR FAMILY LEAVE FORMS, YOU MUST BRING THEM TO THE OFFICE FOR PROCESSING.  PLEASE DO NOT GIVE THEM TO YOUR DOCTOR.  1. A prescription for pain medication may be given to you upon discharge.  Take your pain medication as prescribed, if needed.  If narcotic pain medicine is not needed, then you may take acetaminophen (Tylenol) or ibuprofen (Advil) as needed. 2. Take your usually prescribed medications unless otherwise directed. 3. If you need a refill on your pain medication, please contact your pharmacy. They will contact our office to request authorization.  Prescriptions will not be filled after 5pm or on week-ends. 4. You should follow a light diet the first few days after arrival home, such as soup and crackers, pudding, etc.unless your doctor has advised otherwise. A high-fiber, low fat diet can be resumed as tolerated.   Be sure to include lots of fluids daily. Most patients will experience some swelling and bruising on the chest and neck area.  Ice packs will help.  Swelling and bruising can take several days to resolve 5. Most patients will experience some swelling and bruising in the area of the incision. Ice pack will help. Swelling and bruising can take several days to resolve..  6. It is common to experience some constipation if taking pain medication after surgery.  Increasing fluid intake and taking a stool softener will usually help or prevent this problem from occurring.  A mild laxative (Milk of Magnesia or Miralax) should be taken according to package directions if there are no bowel movements after 48 hours. 7.  Any sutures or staples will be removed at the office during your follow-up visit. You may find that a light gauze bandage over your incision may  keep your staples from being rubbed or pulled. You may shower and replace the bandage daily. 8. ACTIVITIES:  You may resume regular (light) daily activities beginning the next day--such as daily self-care, walking, climbing stairs--gradually increasing activities as tolerated.  You may have sexual intercourse when it is comfortable.  Refrain from any heavy lifting or straining until approved by your doctor. a. You may drive when you no longer are taking prescription pain medication, you can comfortably wear a seatbelt, and you can safely maneuver your car and apply brakes 9. You should see your doctor in the office for a follow-up appointment approximately two weeks after your surgery.  Make sure that you call for this appointment within a day or two after you arrive home to insure a convenient appointment time. OTHER INSTRUCTIONS:  _____________________________________________________________ _____________________________________________________________  WHEN TO CALL YOUR DOCTOR: 1. Fever over 101.0 2. Inability to urinate 3. Nausea and/or vomiting 4. Extreme swelling or bruising 5. Continued bleeding from incision. 6. Increased pain, redness, or drainage from the incision. 7. Difficulty swallowing or breathing 8. Muscle cramping or spasms. 9. Numbness or tingling in hands or feet or around lips.  The clinic staff is available to answer your questions during regular business hours.  Please dont hesitate to call and ask to speak to one of the nurses if you have concerns.  For further questions, please visit [email or web address]   Nutrition Post Hospital Stay Proper nutrition can help your body recover from illness and injury.   Foods and beverages high in protein,  vitamins, and minerals help rebuild muscle loss, promote healing, & reduce fall risk.   In addition to eating healthy foods, a nutrition shake is an easy, delicious way to get the nutrition you need during and after  your hospital stay  It is recommended that you continue to drink 2 bottles per day of:       Ensure Plus  for at least 1 month (30 days) after your hospital stay   Tips for adding a nutrition shake into your routine: As allowed, drink one with vitamins or medications instead of water or juice Enjoy one as a tasty mid-morning or afternoon snack Drink cold or make a milkshake out of it Drink one instead of milk with cereal or snacks Use as a coffee creamer   Available at the following grocery stores and pharmacies:           * Karin Golden * [employer] * [employer]  * R[employer]          * [employer] * [employer]  * Wal[facility]reens      * Target  * [employer]'s   * [facility]  * [employer]   * [facility] [phone]            For COUPONS visit: www.ensure.com/join or RoleLink.com.br   Suggested Substitutions Ensure Plus = Boost Plus = Carnation Breakfast Essentials = Boost Compact Ensure Active Clear = Boost Breeze Glucerna Shake = Boost Glucose Control = Carnation Breakfast Essentials SUGAR FREE     Gastrostomy Tube Home Guide, Adult A gastrostomy tube, or G-tube, is a tube that is inserted through the abdomen into the stomach. How to care for a G-tube Supplies needed  Saline solution or clean, warm water and soap.  Cotton swab or gauze.  Precut gauze bandage (dressing) and tape, if needed. Instructions 1. Wash your hands with soap and water. 2. If there is a dressing between the person's skin and the tube, remove it. 3. Check the area where the tube enters the skin. Check for problems such as: ? Redness. ? Swelling. ? Pus-like drainage. ? Extra skin growth. 4. Moisten the cotton swab with the saline solution or soap and water mixture. Gently clean around the insertion site. Remove any drainage or crusting. ? When the G-tube is first put in, a normal saline solution or water can be used to clean the skin. ? Mild soap and warm water can be used when the skin  around the G-tube site has healed. 5. If there should be a dressing between the person's skin and the tube, apply it at this time. G-tube problems and solutions  If the tube comes out: ? Cover the opening with a clean dressing and tape. ? Call a health care provider right away. ? A health care provider will need to put the tube back in within 4 hours.  If there is skin or scar tissue growing where the tube enters the skin: ? Keep the area clean and dry. ? Secure the tube with tape so that the tube does not move around too much. ? Call a health care provider.  If the tube gets clogged: ? Slowly push warm water into the tube with a large syringe. ? Do not force the fluid into the tube or push an object into the tube. ? If you are not able to unclog the tube, call a health care provider right away. General instructions  Do not pull or put tension on the tube.  Keep G tube clamped Contact a health care provider if:  The person with the tube has any of these problems: ? Constipation. ? Fever.  There is a large amount of fluid or mucus-like liquid leaking from the tube.  Skin or scar tissue appears to be growing where the tube enters the skin.  The length of tube from the insertion site to the G-tube gets longer. Get help right away if:  The person with the tube has any of these problems: ? Severe abdominal pain. ? Severe tenderness. ? Severe bloating. ? Nausea. ? Vomiting. ? Trouble breathing. ? Shortness of breath.  Any of these problems happen in the area where the tube enters the skin: ? Redness, irritation, swelling, or soreness. ? Pus-like discharge. ? A bad smell.  The tube is clogged and cannot be flushed.  The tube comes out.

## 2018-05-24 NOTE — Progress Notes (Signed)
Physical Therapy Treatment Patient Details Name: Dana Saunders MRN: [id number] DOB: 1930-04-27 Today's Date: 05/24/2018    History of Present Illness Pt is an 85 y/o female admitted secondary to gastric volvulus. Pt is s/p exploratory laparotomy with insertion for Gastrostomy tube and repair of volvulus. PMH includes HTN and DM.     PT Comments    Pt continues to progress towards goals. Continues to be limited secondary to fatigue. Required min guard to min A for mobility tasks using RW. Current recommendations appropriate. Will continue to follow acutely to maximize functional mobility independence and safety.    Follow Up Recommendations  Home health PT;Supervision/Assistance - 24 hour     Equipment Recommendations  3in1 (PT)    Recommendations for Other Services       Precautions / Restrictions Precautions Precautions: Fall;Other (comment) Precaution Comments: G tube Restrictions Weight Bearing Restrictions: No    Mobility  Bed Mobility Overal bed mobility: Needs Assistance Bed Mobility: Supine to Sit     Supine to sit: Supervision     General bed mobility comments: Supervision for safety. Heavy use of bed rails.   Transfers Overall transfer level: Needs assistance Equipment used: Rolling walker (2 wheeled) Transfers: Sit to/from Stand Sit to Stand: Min assist         General transfer comment: Min A for lift assist to stand. Cues for safe hand placement.   Ambulation/Gait Ambulation/Gait assistance: Min guard Gait Distance (Feet): 100 Feet Assistive device: Rolling walker (2 wheeled) Gait Pattern/deviations: Step-through pattern;Decreased stride length;Trunk flexed Gait velocity: Decreased   General Gait Details: Slightly increased gait speed this session and improved gait tolerance. Required cues for upright posture and proximity to device. Pt continues to be limited in gait distance secondary to fatigue.    Stairs             Wheelchair  Mobility    Modified Rankin (Stroke Patients Only)       Balance Overall balance assessment: Needs assistance Sitting-balance support: No upper extremity supported;Feet supported Sitting balance-Leahy Scale: Fair     Standing balance support: Bilateral upper extremity supported;During functional activity Standing balance-Leahy Scale: Poor Standing balance comment: Reliant on BUE support.                             Cognition Arousal/Alertness: Awake/alert Behavior During Therapy: WFL for tasks assessed/performed Overall Cognitive Status: Within Functional Limits for tasks assessed                                        Exercises      General Comments        Pertinent Vitals/Pain Pain Assessment: Faces Faces Pain Scale: Hurts little more Pain Location: abdomen Pain Descriptors / Indicators: Grimacing;Guarding Pain Intervention(s): Limited activity within patient's tolerance;Monitored during session;Repositioned    Home Living                      Prior Function            PT Goals (current goals can now be found in the care plan section) Acute Rehab PT Goals Patient Stated Goal: to go home  PT Goal Formulation: With patient Time For Goal Achievement: 06/05/18 Potential to Achieve Goals: Good Progress towards PT goals: Progressing toward goals    Frequency    Min 3X/week  PT Plan Current plan remains appropriate    Co-evaluation              AM-PAC PT "6 Clicks" Mobility   Outcome Measure  Help needed turning from your back to your side while in a flat bed without using bedrails?: A Little Help needed moving from lying on your back to sitting on the side of a flat bed without using bedrails?: A Lot Help needed moving to and from a bed to a chair (including a wheelchair)?: A Little Help needed standing up from a chair using your arms (e.g., wheelchair or bedside chair)?: A Little Help needed to walk in  hospital room?: A Little Help needed climbing 3-5 steps with a railing? : A Lot 6 Click Score: 16    End of Session   Activity Tolerance: Patient tolerated treatment well Patient left: in chair;with call bell/phone within reach;with chair alarm set Nurse Communication: Mobility status PT Visit Diagnosis: Unsteadiness on feet (R26.81);Muscle weakness (generalized) (M62.81);Pain Pain - part of body: (abdomen)     Time: 0998-3382 PT Time Calculation (min) (ACUTE ONLY): 18 min  Charges:  $Gait Training: 8-22 mins                     Gladys Damme, PT, DPT  Acute Rehabilitation Services  Pager: [phone] Office: [phone]    [name] 05/24/2018, 5:18 PM

## 2018-05-25 LAB — CBC
HCT: 30 % — ABNORMAL LOW (ref 36.0–46.0)
Hemoglobin: 11.2 g/dL — ABNORMAL LOW (ref 12.0–15.0)
MCH: 29 pg (ref 26.0–34.0)
MCHC: 37.3 g/dL — ABNORMAL HIGH (ref 30.0–36.0)
MCV: 77.7 fL — ABNORMAL LOW (ref 80.0–100.0)
Platelets: 277 10*3/uL (ref 150–400)
RBC: 3.86 MIL/uL — ABNORMAL LOW (ref 3.87–5.11)
RDW: 13.1 % (ref 11.5–15.5)
WBC: 8.6 10*3/uL (ref 4.0–10.5)
nRBC: 0 % (ref 0.0–0.2)

## 2018-05-25 LAB — BASIC METABOLIC PANEL
Anion gap: 8 (ref 5–15)
BUN: <5 mg/dL — ABNORMAL LOW (ref 8–23)
CHLORIDE: 102 mmol/L (ref 98–111)
CO2: 23 mmol/L (ref 22–32)
CREATININE: 0.46 mg/dL (ref 0.44–1.00)
Calcium: 8.2 mg/dL — ABNORMAL LOW (ref 8.9–10.3)
GFR calc Af Amer: >60 mL/min (ref >60)
GFR calc non Af Amer: >60 mL/min (ref >60)
Glucose, Bld: 147 mg/dL — ABNORMAL HIGH (ref 70–99)
Potassium: 3.9 mmol/L (ref 3.5–5.1)
SODIUM: 133 mmol/L — AB (ref 135–145)

## 2018-05-25 LAB — GLUCOSE, CAPILLARY
GLUCOSE-CAPILLARY: 131 mg/dL — AB (ref 70–99)
Glucose-Capillary: 199 mg/dL — ABNORMAL HIGH (ref 70–99)
Glucose-Capillary: 155 mg/dL — ABNORMAL HIGH (ref 70–99)
Glucose-Capillary: 123 mg/dL — ABNORMAL HIGH (ref 70–99)

## 2018-05-25 MED ORDER — AMLODIPINE BESYLATE 10 MG PO TABS
10.0000 mg | ORAL_TABLET | Freq: Every day | ORAL | Status: DC
  Administered 2018-05-25 – 2018-05-28 (×4): 10 mg via ORAL
  Filled 2018-05-25 (×4): qty 1

## 2018-05-25 MED ORDER — POLYETHYLENE GLYCOL 3350 17 G PO PACK
17.0000 g | PACK | Freq: Every day | ORAL | Status: DC
  Administered 2018-05-25 – 2018-05-28 (×4): 17 g via ORAL
  Filled 2018-05-25 (×4): qty 1

## 2018-05-25 MED ORDER — DOCUSATE SODIUM 100 MG PO CAPS
100.0000 mg | ORAL_CAPSULE | Freq: Two times a day (BID) | ORAL | Status: DC
  Administered 2018-05-25 – 2018-05-28 (×7): 100 mg via ORAL
  Filled 2018-05-25 (×7): qty 1

## 2018-05-25 MED ORDER — HYDROMORPHONE HCL 1 MG/ML IJ SOLN: 0.5000 mg | INTRAMUSCULAR | Status: DC | PRN

## 2018-05-25 MED ORDER — ENOXAPARIN SODIUM 40 MG/0.4ML ~~LOC~~ SOLN
40.0000 mg | SUBCUTANEOUS | Status: DC
  Administered 2018-05-25 – 2018-05-28 (×4): 40 mg via SUBCUTANEOUS
  Filled 2018-05-25 (×4): qty 0.4

## 2018-05-25 MED ORDER — OXYCODONE HCL 5 MG PO TABS: 5.0000 mg | ORAL_TABLET | ORAL | Status: DC | PRN

## 2018-05-25 NOTE — Progress Notes (Signed)
Occupational Therapy Treatment Patient Details Name: Dana Saunders MRN: [id number] DOB: August 25, 1930 Today's Date: 05/25/2018    History of present illness Pt is an 85 y/o female admitted secondary to gastric volvulus. Pt is s/p exploratory laparotomy with insertion for Gastrostomy tube and repair of volvulus. PMH includes HTN and DM.    OT comments  Pt continues to progress toward goals. She fatigues quickly after toileting and grooming.  Will continue to follow acutely in order to maximize safety and independence prior to return home.   Follow Up Recommendations  Home health OT;Supervision/Assistance - 24 hour    Equipment Recommendations  None recommended by OT    Recommendations for Other Services      Precautions / Restrictions Precautions Precautions: Fall;Other (comment) Precaution Comments: G tube       Mobility Bed Mobility Overal bed mobility: Needs Assistance Bed Mobility: Sit to Sidelying         Sit to sidelying: Min assist General bed mobility comments: Educated pt on sit to sidelying technique to reduce abdominal discomfort during transition.  Transfers Overall transfer level: Needs assistance Equipment used: Rolling walker (2 wheeled) Transfers: Sit to/from Stand Sit to Stand: Min assist;Mod assist         General transfer comment: Min assist from recliner (pillows in seat) and handicapped height toilet. Mod assist to stand from bed at end of session (needing to side step to [facility]).     Balance                                           ADL either performed or assessed with clinical judgement   ADL Overall ADL's : Needs assistance/impaired     Grooming: Wash/dry hands;Standing;Supervision/safety                   Toilet Transfer: Minimal assistance;RW;Ambulation;Comfort height toilet   Toileting- Clothing Manipulation and Hygiene: Minimal assistance;Sit to/from stand       Functional mobility during ADLs: Min  guard;Rolling walker General ADL Comments: Pt received up in chair.  Ambulated to bathroom for toileting and to wash hands. Fatigued by end of session and requested return to bed (had been up in chair this morning and had already bathed).     Vision       Perception     Praxis      Cognition Arousal/Alertness: Awake/alert Behavior During Therapy: WFL for tasks assessed/performed Overall Cognitive Status: Within Functional Limits for tasks assessed                                          Exercises     Shoulder Instructions       General Comments      Pertinent Vitals/ Pain       Pain Assessment: Faces Faces Pain Scale: Hurts little more Pain Location: abdomen Pain Descriptors / Indicators: Grimacing;Guarding Pain Intervention(s): Limited activity within patient's tolerance;Premedicated before session;Repositioned  Home Living                                          Prior Functioning/Environment              Frequency  Min  2X/week        Progress Toward Goals  OT Goals(current goals can now be found in the care plan section)  Progress towards OT goals: Progressing toward goals  Acute Rehab OT Goals Patient Stated Goal: to go home  OT Goal Formulation: With patient Time For Goal Achievement: 06/06/18 Potential to Achieve Goals: Good ADL Goals Pt Will Transfer to Toilet: with supervision;ambulating;bedside commode Additional ADL Goal #1: pt will complete adl with min A using reacher for pants and LB bathing (no socks/shoes Additional ADL Goal #2: pt will initiate at least oe rest break for energy conservation  Plan Discharge plan remains appropriate    Co-evaluation                 AM-PAC OT "6 Clicks" Daily Activity     Outcome Measure   Help from another person eating meals?: None Help from another person taking care of personal grooming?: A Little Help from another person toileting, which includes  using toliet, bedpan, or urinal?: A Little Help from another person bathing (including washing, rinsing, drying)?: A Little Help from another person to put on and taking off regular upper body clothing?: A Little Help from another person to put on and taking off regular lower body clothing?: A Lot 6 Click Score: 18    End of Session Equipment Utilized During Treatment: Rolling walker  OT Visit Diagnosis: Unsteadiness on feet (R26.81);Muscle weakness (generalized) (M62.81)   Activity Tolerance Patient tolerated treatment well   Patient Left in bed;with call bell/phone within reach   Nurse Communication (IV pole beeping)        Time: 3267-1245 OT Time Calculation (min): 32 min  Charges: OT General Charges $OT Visit: 1 Visit OT Treatments $Self Care/Home Management : 23-37 mins     Cipriano Mile OTR/L Acute Rehab Services [phone] 05/25/2018, 10:35 AM

## 2018-05-25 NOTE — Plan of Care (Signed)
  Problem: Clinical Measurements: Goal: Ability to maintain clinical measurements within normal limits will improve Outcome: Progressing   Problem: Clinical Measurements: Goal: Postoperative complications will be avoided or minimized Outcome: Progressing   Problem: Clinical Measurements: Goal: Ability to maintain clinical measurements within normal limits will improve Outcome: Progressing

## 2018-05-25 NOTE — Progress Notes (Signed)
  [facility] Progress Note  5 Days Post-Op  Subjective: CC-  Up in chair. Slept well last night. Abdominal pain well controlled. Denies n/v. Tolerating soft diet. States that she typically does not eat much, and she is tolerating about what she typically eats. Passing flatus. Some burping. No BM. Denies abdominal bloating. Takes stool softeners PRN at home.  PT/OT recommending home health therapies. Patient states that between her daughter and nieces she will have a lot of support at home.  Objective: Vital signs in last 24 hours: Temp:  [98.5 F (36.9 C)-98.6 F (37 C)] 98.6 F (37 C) (03/27 0440) Pulse Rate:  [74-95] 74 (03/27 0440) Resp:  [18] 18 (03/27 0440) BP: (144-161)/(54-76) 161/68 (03/27 0440) SpO2:  [97 %-98 %] 98 % (03/27 0440) Last BM Date: 05/19/18  Intake/Output from previous day: 03/26 0701 - 03/27 0700 In: 2424.8 [P.O.:720; I.V.:1704.8] Out: 1550 [Urine:1550] Intake/Output this shift: No intake/output data recorded.  PE: Gen:  Alert, NAD, pleasant HEENT: EOM's intact, pupils equal and round Pulm:  effort normal Abd: Soft, NT/ND, +BS, midline incision cdi with staples intact and no erythema or drainage, G-tube site cdi Skin: no rashes noted, warm and dry  Lab Results:  Recent Labs    05/24/18 0213 05/25/18 0747  WBC 9.5 8.6  HGB 11.5* 11.2*  HCT 32.0* 30.0*  PLT 231 277   BMET Recent Labs    05/24/18 0213 05/25/18 0747  NA 130* 133*  K 3.6 3.9  CL 100 102  CO2 21* 23  GLUCOSE 124* 147*  BUN <5* <5*  CREATININE 0.44 0.46  CALCIUM 8.1* 8.2*   PT/INR No results for input(s): LABPROT, INR in the last 72 hours. CMP     Component Value Date/Time   NA 133 (L) 05/25/2018 0747   K 3.9 05/25/2018 0747   CL 102 05/25/2018 0747   CO2 23 05/25/2018 0747   GLUCOSE 147 (H) 05/25/2018 0747   BUN <5 (L) 05/25/2018 0747   CREATININE 0.46 05/25/2018 0747   CALCIUM 8.2 (L) 05/25/2018 0747   PROT 7.3 05/21/2018 0216   ALBUMIN 3.5  05/21/2018 0216   AST 23 05/21/2018 0216   ALT 13 05/21/2018 0216   ALKPHOS 44 05/21/2018 0216   BILITOT 0.8 05/21/2018 0216   GFRNONAA >60 05/25/2018 0747   GFRAA >60 05/25/2018 0747   Lipase     Component Value Date/Time   LIPASE 28 05/22/2018 0136       Studies/Results: No results found.  Anti-infectives: Anti-infectives (From admission, onward)   None       Assessment/Plan DM HTN HLD Hypothyroidism  Gastric volvulus S/p EXPLORATORY LAPAROTOMY, REDUCTION OF GASTRIC VOLVULUS,  GASTROSTOMY TUBE PLACEMENT 24FR MAL[place]OT 3/22 Dr. Janee Morn - POD 5 - honeycomb dressing removed - Keep G-tube clamped. Continue soft diet. Add miralax/colace. Continue ambulating. Home when she has a bowel movement. F/u info and discharge instructions on AVS.  ID - none FEN - KVO IVF, soft diet, Ensure VTE - SCDs, ok for chemical DVT prophylaxis from surgical standpoint Foley - out 3/24 Follow up - Dr. Janee Morn   LOS: 5 days    Franne Forts , [facility] Surgery 05/25/2018, 8:39 AM Pager: [phone] Mon-Thurs 7:00 am-4:30 pm Fri 7:00 am -11:30 AM Sat-Sun 7:00 am-11:30 am

## 2018-05-25 NOTE — Care Management Important Message (Signed)
Important Message  Patient Details  Name: Dana Saunders MRN: [id number] Date of Birth: 19-Jan-1931   Medicare Important Message Given:  Yes    Dorena Bodo 05/25/2018, 4:14 PM

## 2018-05-25 NOTE — TOC Progression Note (Addendum)
Transition of Care [facility]) - Progression Note    Patient Details  Name: Dana Saunders MRN: [id number] Date of Birth: [birth year]  Transition of Care [facility]) CM/SW Contact  Nadene Rubins Adria Devon, RN Phone Number: 05/25/2018, 9:56 AM  Clinical Narrative:      Possible discharge today or tomorrow , received orders for home health PT/OT , Tiffany with [facility] aware.  Called with [facility] for 3 in 1  Expected Discharge Plan: Home w Home Health Services Barriers to Discharge: Continued Medical Work up  Expected Discharge Plan and Services Expected Discharge Plan: Home w Home Health Services   Discharge Planning Services: CM Consult Post Acute Care Choice: Durable Medical Equipment Living arrangements for the past 2 months: Single Family Home                 DME Arranged: 3-N-1 DME Agency: [facility] HH Arranged: OT HH Agency: [employer] [facility] (now [facility]   Social Determinants of Health (SDOH) Interventions    Readmission Risk Interventions No flowsheet data found.

## 2018-05-25 NOTE — Progress Notes (Signed)
PROGRESS NOTE    Dana Saunders  WNI:[id number] DOB: 08-15-1930 DOA: 05/20/2018 PCP: Renford Dills, MD   Brief Narrative: 85 year old with past medical history significant for diabetes type 2, hypertension, hyperlipidemia who presented with abdominal pain, cough, shortness of breath.  CT showed gastric volvulus. Patient underwent emergent endoscopy and admission which showed changes of gastric volvulus with necrotic gastric mucosa.  This was discussed with Dr. Janee Morn, patient was taken to the OR for decompression, patient underwent exploratory laparotomy, reduction of gastric volvulus, gastrostomy tube placement 24 [place] on 3/22.    Assessment & Plan:   Principal Problem:   Gastric volvulus Active Problems:   Hyponatremia   Elevated lipase   Diabetes mellitus type 2, noninsulin dependent (HCC)   Hyperlipidemia   Benign essential HTN   Malnutrition of moderate degree   Hypokalemia   Hypomagnesemia  1-Gastric volvulus: Status post urgent endoscopy then exploratory laparoscopy with reduction and gastrostomy tube placement on 05/20/2018. Surgery following. Gastric tube was clamp 3-25. She started to pass flatus. Denies worsening abdominal pain.  Tolerating soft diet. Awaiting for patient to have Bowel movement to discharge home.  2-Hyponatremia: Hold hydrochlorothiazide. Continue with IV fluids.  Improved.   Hypokalemia/hypomagnesemia Replaced.   Non-insulin-dependent type 2 diabetic hold metformin and Januvia. Continue with a sliding scale insulin.  HTN;  Hydrochlorothiazide and losartan in the setting of hyponatremia and poor oral intake. On PRN hydralazine.  BP still elevated, will increase Norvasc to 10 mg daily.    Nutrition Problem: Moderate Malnutrition Etiology: acute illness    Signs/Symptoms: moderate muscle depletion, moderate fat depletion, energy intake < or equal to 50% for > or equal to 5 days    Interventions: Ensure Enlive (each supplement  provides 350kcal and 20 grams of protein), MVI  Estimated body mass index is 25.69 kg/m as calculated from the following:   Height as of this encounter: 5\' 3"  (1.6 m).   Weight as of this encounter: 65.8 kg.   DVT prophylaxis:  Code Status: Full code Family Communication: Care discussed with patient Disposition Plan: Home when cleared by surgery  Consultants:   General surgery   Procedures:   3/22 status post urgent EGD then exploratory laparoscopy with reduction and gastrostomy tube placement  Antimicrobials:  None  Subjective: Abdominal pain controlled.  Denies nausea.  No BM yet, passing flatus.   Objective: Vitals:   05/24/18 0421 05/24/18 1436 05/24/18 2005 05/25/18 0440  BP: (!) 156/78 (!) 154/76 (!) 144/54 (!) 161/68  Pulse: 84 95 80 74  Resp: 18 18 18 18   Temp: 98.4 F (36.9 C) 98.5 F (36.9 C) 98.6 F (37 C) 98.6 F (37 C)  TempSrc: Oral Oral Oral Oral  SpO2: 97% 97% 98% 98%  Weight:      Height:        Intake/Output Summary (Last 24 hours) at 05/25/2018 0926 Last data filed at 05/25/2018 0820 Gross per 24 hour  Intake 2424.77 ml  Output 2050 ml  Net 374.77 ml   Filed Weights   05/20/18 1218  Weight: 65.8 kg    Examination:  General exam: NAD Respiratory system: CTA Cardiovascular system: S 1, S 2 RRR Gastrointestinal system: BS present,. Soft, nt, G tube in place , clamp.  Central nervous system: Non focal.  Extremities: symmetric power.  Skin: no rashes.    Data Reviewed: I have personally reviewed following labs and imaging studies  CBC: Recent Labs  Lab 05/20/18 1222 05/21/18 0216 05/22/18 0136 05/23/18 0153 05/24/18 3818 05/25/18 2993  WBC 11.9* 15.6* 14.4* 11.9* 9.5 8.6  NEUTROABS 10.4*  --  11.4* 9.4*  --   --   HGB 14.5 12.5 11.5* 11.6* 11.5* 11.2*  HCT 40.0 34.9* 30.7* 30.9* 32.0* 30.0*  MCV 78.6* 78.1* 78.3* 77.4* 78.0* 77.7*  PLT 216 213 184 202 231 277   Basic Metabolic Panel: Recent Labs  Lab 05/21/18 0216  05/22/18 0136 05/23/18 0153 05/24/18 0213 05/25/18 0747  NA 132* 132* 130* 130* 133*  K 3.4* 2.8* 3.2* 3.6 3.9  CL 98 99 99 100 102  CO2 22 21* 23 21* 23  GLUCOSE 192* 88 170* 124* 147*  BUN 7* <5* <5* <5* <5*  CREATININE 0.58 0.44 0.43* 0.44 0.46  CALCIUM 8.2* 7.3* 7.7* 8.1* 8.2*  MG  --  1.3* 1.9  --   --    GFR: Estimated Creatinine Clearance: 46.9 mL/min (by C-G formula based on SCr of 0.46 mg/dL). Liver Function Tests: Recent Labs  Lab 05/20/18 1222 05/21/18 0216  AST 25 23  ALT 11 13  ALKPHOS 58 44  BILITOT 0.8 0.8  PROT 9.5* 7.3  ALBUMIN 4.7 3.5   Recent Labs  Lab 05/20/18 1222 05/22/18 0136  LIPASE 153* 28   No results for input(s): AMMONIA in the last 168 hours. Coagulation Profile: No results for input(s): INR, PROTIME in the last 168 hours. Cardiac Enzymes: No results for input(s): CKTOTAL, CKMB, CKMBINDEX, TROPONINI in the last 168 hours. BNP (last 3 results) No results for input(s): PROBNP in the last 8760 hours. HbA1C: No results for input(s): HGBA1C in the last 72 hours. CBG: Recent Labs  Lab 05/24/18 1238 05/24/18 1655 05/24/18 2002 05/24/18 2228 05/25/18 0813  GLUCAP 143* 127* 305* 147* 131*   Lipid Profile: No results for input(s): CHOL, HDL, LDLCALC, TRIG, CHOLHDL, LDLDIRECT in the last 72 hours. Thyroid Function Tests: No results for input(s): TSH, T4TOTAL, FREET4, T3FREE, THYROIDAB in the last 72 hours. Anemia Panel: No results for input(s): VITAMINB12, FOLATE, FERRITIN, TIBC, IRON, RETICCTPCT in the last 72 hours. Sepsis Labs: Recent Labs  Lab 05/20/18 2204  LATICACIDVEN 2.2*    No results found for this or any previous visit (from the past 240 hour(s)).       Radiology Studies: No results found.      Scheduled Meds: . amLODipine  10 mg Oral Daily  . docusate sodium  100 mg Oral BID  . feeding supplement (ENSURE ENLIVE)  237 mL Oral BID BM  . insulin aspart  0-9 Units Subcutaneous TID WC  . levothyroxine  50  mcg Intravenous Daily  . magnesium oxide  200 mg Oral BID  . multivitamin with minerals  1 tablet Oral Daily  . polyethylene glycol  17 g Oral Daily  . sodium chloride flush  3 mL Intravenous Q12H   Continuous Infusions: . sodium chloride Stopped (05/22/18 1621)  . sodium chloride 75 mL/hr at 05/24/18 0147     LOS: 5 days    Time spent: 35 minutes.     Alba Cory, MD [facility] Pager [phone]  If 7PM-7AM, please contact night-coverage [email or web address] Password [facility] 05/25/2018, 9:26 AM

## 2018-05-26 LAB — CBC
HEMATOCRIT: 29.4 % — AB (ref 36.0–46.0)
HEMOGLOBIN: 11 g/dL — AB (ref 12.0–15.0)
MCH: 28.9 pg (ref 26.0–34.0)
MCHC: 37.4 g/dL — ABNORMAL HIGH (ref 30.0–36.0)
MCV: 77.2 fL — ABNORMAL LOW (ref 80.0–100.0)
Platelets: 283 10*3/uL (ref 150–400)
RBC: 3.81 MIL/uL — AB (ref 3.87–5.11)
RDW: 13 % (ref 11.5–15.5)
WBC: 8.2 10*3/uL (ref 4.0–10.5)
nRBC: 0 % (ref 0.0–0.2)

## 2018-05-26 LAB — BASIC METABOLIC PANEL
Anion gap: 7 (ref 5–15)
BUN: <5 mg/dL — ABNORMAL LOW (ref 8–23)
CO2: 21 mmol/L — AB (ref 22–32)
Calcium: 8.3 mg/dL — ABNORMAL LOW (ref 8.9–10.3)
Chloride: 103 mmol/L (ref 98–111)
Creatinine, Ser: 0.4 mg/dL — ABNORMAL LOW (ref 0.44–1.00)
GFR calc Af Amer: >60 mL/min (ref >60)
GFR calc non Af Amer: >60 mL/min (ref >60)
Glucose, Bld: 129 mg/dL — ABNORMAL HIGH (ref 70–99)
POTASSIUM: 3.9 mmol/L (ref 3.5–5.1)
Sodium: 131 mmol/L — ABNORMAL LOW (ref 135–145)

## 2018-05-26 LAB — GLUCOSE, CAPILLARY
GLUCOSE-CAPILLARY: 141 mg/dL — AB (ref 70–99)
Glucose-Capillary: 120 mg/dL — ABNORMAL HIGH (ref 70–99)
Glucose-Capillary: 215 mg/dL — ABNORMAL HIGH (ref 70–99)
Glucose-Capillary: 148 mg/dL — ABNORMAL HIGH (ref 70–99)

## 2018-05-26 MED ORDER — GLYCERIN (LAXATIVE) 2.1 G RE SUPP
1.0000 | Freq: Once | RECTAL | Status: AC
  Administered 2018-05-26: 1 via RECTAL
  Filled 2018-05-26 (×2): qty 1

## 2018-05-26 NOTE — Progress Notes (Signed)
Physical Therapy Treatment Patient Details Name: Dana Saunders MRN: [id number] DOB: [birth year] Today's Date: 05/26/2018    History of Present Illness Pt is an 85 y/o female admitted secondary to gastric volvulus. Pt is s/p exploratory laparotomy with insertion for Gastrostomy tube and repair of volvulus. PMH includes HTN and DM.     PT Comments    Patient seen for mobility progression. Pt tolerated increased gait distance of 80 ft with grossly min guard for safety. Min guard/min A for functional transfers this session. Current plan remains appropriate.    Follow Up Recommendations  Home health PT;Supervision/Assistance - 24 hour     Equipment Recommendations  3in1 (PT)    Recommendations for Other Services       Precautions / Restrictions Precautions Precautions: Fall;Other (comment) Precaution Comments: G tube Restrictions Weight Bearing Restrictions: No    Mobility  Bed Mobility               General bed mobility comments: pt OOB upona arrival  Transfers Overall transfer level: Needs assistance Equipment used: Rolling walker (2 wheeled) Transfers: Sit to/from Stand Sit to Stand: Min guard;Min assist         General transfer comment: cues for safe hand placement; min guard to rise and min A to steady while transitioning hand placement; increased time   Ambulation/Gait Ambulation/Gait assistance: Min guard(close supervision) Gait Distance (Feet): 80 Feet Assistive device: Rolling walker (2 wheeled) Gait Pattern/deviations: Step-through pattern;Decreased step length - right;Decreased step length - left;Trunk flexed Gait velocity: Decreased   General Gait Details: mild SOB and pt fatigued; cues to maintain safe proximity to RW and for upright posture/forward gaze   Stairs             Wheelchair Mobility    Modified Rankin (Stroke Patients Only)       Balance Overall balance assessment: Needs assistance   Sitting balance-Leahy Scale:  Fair     Standing balance support: Bilateral upper extremity supported;During functional activity Standing balance-Leahy Scale: Poor                              Cognition Arousal/Alertness: Awake/alert Behavior During Therapy: WFL for tasks assessed/performed Overall Cognitive Status: Within Functional Limits for tasks assessed                                        Exercises      General Comments        Pertinent Vitals/Pain Pain Assessment: No/denies pain    Home Living                      Prior Function            PT Goals (current goals can now be found in the care plan section) Acute Rehab PT Goals Patient Stated Goal: to go home  Progress towards PT goals: Progressing toward goals    Frequency    Min 3X/week      PT Plan Current plan remains appropriate    Co-evaluation              AM-PAC PT "6 Clicks" Mobility   Outcome Measure  Help needed turning from your back to your side while in a flat bed without using bedrails?: A Little Help needed moving from lying on your back to sitting on the  side of a flat bed without using bedrails?: A Lot Help needed moving to and from a bed to a chair (including a wheelchair)?: A Little Help needed standing up from a chair using your arms (e.g., wheelchair or bedside chair)?: A Little Help needed to walk in hospital room?: A Little Help needed climbing 3-5 steps with a railing? : A Lot 6 Click Score: 16    End of Session Equipment Utilized During Treatment: Gait belt Activity Tolerance: Patient tolerated treatment well Patient left: in chair;with call bell/phone within reach Nurse Communication: Mobility status PT Visit Diagnosis: Unsteadiness on feet (R26.81);Muscle weakness (generalized) (M62.81);Pain Pain - part of body: (abdomen)     Time: 0071-2197 PT Time Calculation (min) (ACUTE ONLY): 15 min  Charges:  $Gait Training: 8-22 mins                      Erline Levine, PTA Acute Rehabilitation Services Pager: [phone] Office: [phone]     Carolynne Edouard 05/26/2018, 11:00 AM

## 2018-05-26 NOTE — Progress Notes (Signed)
PROGRESS NOTE    Dana Saunders  QRF:[id number] DOB: 1930-05-05 DOA: 05/20/2018 PCP: Renford Dills, MD   Brief Narrative: 85 year old with past medical history significant for diabetes type 2, hypertension, hyperlipidemia who presented with abdominal pain, cough, shortness of breath.  CT showed gastric volvulus. Patient underwent emergent endoscopy and admission which showed changes of gastric volvulus with necrotic gastric mucosa.  This was discussed with Dr. Janee Morn, patient was taken to the OR for decompression, patient underwent exploratory laparotomy, reduction of gastric volvulus, gastrostomy tube placement 24 [place] on 3/22.    Assessment & Plan:   Principal Problem:   Gastric volvulus Active Problems:   Hyponatremia   Elevated lipase   Diabetes mellitus type 2, noninsulin dependent (HCC)   Hyperlipidemia   Benign essential HTN   Malnutrition of moderate degree   Hypokalemia   Hypomagnesemia  1-Gastric volvulus: Status post urgent endoscopy then exploratory laparoscopy with reduction and gastrostomy tube placement on 05/20/2018. Surgery following. Gastric tube was clamp 3-25. She started to pass flatus. Denies worsening abdominal pain.  Tolerating soft diet. Awaiting for patient to have Bowel movement to discharge home. To received glycerine suppository.   2-Hyponatremia: Hold hydrochlorothiazide. Continue with IV fluids.  Stable.   Hypokalemia/hypomagnesemia Replaced.   Non-insulin-dependent type 2 diabetic hold metformin and Januvia. Continue with a sliding scale insulin.  HTN;  Hydrochlorothiazide and losartan in the setting of hyponatremia and poor oral intake. On PRN hydralazine.  BP better control on Norvasc 10 mg daily.    Nutrition Problem: Moderate Malnutrition Etiology: acute illness    Signs/Symptoms: moderate muscle depletion, moderate fat depletion, energy intake < or equal to 50% for > or equal to 5 days    Interventions: Ensure  Enlive (each supplement provides 350kcal and 20 grams of protein), MVI  Estimated body mass index is 25.69 kg/m as calculated from the following:   Height as of this encounter: 5\' 3"  (1.6 m).   Weight as of this encounter: 65.8 kg.   DVT prophylaxis:  Code Status: Full code Family Communication: Care discussed with patient Disposition Plan: Home when cleared by surgery  Consultants:   General surgery   Procedures:   3/22 status post urgent EGD then exploratory laparoscopy with reduction and gastrostomy tube placement  Antimicrobials:  None  Subjective: Tolerating diet, denies abdominal pain.  No BM   Objective: Vitals:   05/25/18 0440 05/25/18 1350 05/25/18 2050 05/26/18 0501  BP: (!) 161/68 140/76 133/64 (!) 133/54  Pulse: 74 78 75 67  Resp: 18 18 18 16   Temp: 98.6 F (37 C) (!) 97.4 F (36.3 C) 98.7 F (37.1 C) 99.1 F (37.3 C)  TempSrc: Oral Oral Oral Oral  SpO2: 98% 97% 99% 98%  Weight:      Height:        Intake/Output Summary (Last 24 hours) at 05/26/2018 8264 Last data filed at 05/26/2018 0600 Gross per 24 hour  Intake 1266.56 ml  Output 1200 ml  Net 66.56 ml   Filed Weights   05/20/18 1218  Weight: 65.8 kg    Examination:  General exam: NAD Respiratory system: CTA Cardiovascular system: S 1, S 2 RRR Gastrointestinal system: BS present, soft, midline incision  with staple clean, g tube in place clamp.  Central nervous system: No focal.  Extremities: Symmetric power.  Skin: no rashes.    Data Reviewed: I have personally reviewed following labs and imaging studies  CBC: Recent Labs  Lab 05/20/18 1222  05/22/18 0136 05/23/18 0153 05/24/18 1583 05/25/18  0747 05/26/18 0327  WBC 11.9*   < > 14.4* 11.9* 9.5 8.6 8.2  NEUTROABS 10.4*  --  11.4* 9.4*  --   --   --   HGB 14.5   < > 11.5* 11.6* 11.5* 11.2* 11.0*  HCT 40.0   < > 30.7* 30.9* 32.0* 30.0* 29.4*  MCV 78.6*   < > 78.3* 77.4* 78.0* 77.7* 77.2*  PLT 216   < > 184 202 231 277 283    < > = values in this interval not displayed.   Basic Metabolic Panel: Recent Labs  Lab 05/22/18 0136 05/23/18 0153 05/24/18 0213 05/25/18 0747 05/26/18 0327  NA 132* 130* 130* 133* 131*  K 2.8* 3.2* 3.6 3.9 3.9  CL 99 99 100 102 103  CO2 21* 23 21* 23 21*  GLUCOSE 88 170* 124* 147* 129*  BUN <5* <5* <5* <5* <5*  CREATININE 0.44 0.43* 0.44 0.46 0.40*  CALCIUM 7.3* 7.7* 8.1* 8.2* 8.3*  MG 1.3* 1.9  --   --   --    GFR: Estimated Creatinine Clearance: 46.9 mL/min (A) (by C-G formula based on SCr of 0.4 mg/dL (L)). Liver Function Tests: Recent Labs  Lab 05/20/18 1222 05/21/18 0216  AST 25 23  ALT 11 13  ALKPHOS 58 44  BILITOT 0.8 0.8  PROT 9.5* 7.3  ALBUMIN 4.7 3.5   Recent Labs  Lab 05/20/18 1222 05/22/18 0136  LIPASE 153* 28   No results for input(s): AMMONIA in the last 168 hours. Coagulation Profile: No results for input(s): INR, PROTIME in the last 168 hours. Cardiac Enzymes: No results for input(s): CKTOTAL, CKMB, CKMBINDEX, TROPONINI in the last 168 hours. BNP (last 3 results) No results for input(s): PROBNP in the last 8760 hours. HbA1C: No results for input(s): HGBA1C in the last 72 hours. CBG: Recent Labs  Lab 05/25/18 0813 05/25/18 1226 05/25/18 1730 05/25/18 2047 05/26/18 0807  GLUCAP 131* 199* 155* 123* 120*   Lipid Profile: No results for input(s): CHOL, HDL, LDLCALC, TRIG, CHOLHDL, LDLDIRECT in the last 72 hours. Thyroid Function Tests: No results for input(s): TSH, T4TOTAL, FREET4, T3FREE, THYROIDAB in the last 72 hours. Anemia Panel: No results for input(s): VITAMINB12, FOLATE, FERRITIN, TIBC, IRON, RETICCTPCT in the last 72 hours. Sepsis Labs: Recent Labs  Lab 05/20/18 2204  LATICACIDVEN 2.2*    No results found for this or any previous visit (from the past 240 hour(s)).       Radiology Studies: No results found.      Scheduled Meds: . amLODipine  10 mg Oral Daily  . docusate sodium  100 mg Oral BID  . enoxaparin  (LOVENOX) injection  40 mg Subcutaneous Q24H  . feeding supplement (ENSURE ENLIVE)  237 mL Oral BID BM  . Glycerin (Adult)  1 suppository Rectal Once  . insulin aspart  0-9 Units Subcutaneous TID WC  . levothyroxine  50 mcg Intravenous Daily  . magnesium oxide  200 mg Oral BID  . multivitamin with minerals  1 tablet Oral Daily  . polyethylene glycol  17 g Oral Daily  . sodium chloride flush  3 mL Intravenous Q12H   Continuous Infusions: . sodium chloride Stopped (05/22/18 1621)  . sodium chloride 10 mL/hr at 05/26/18 0600     LOS: 6 days    Time spent: 35 minutes.     Alba Cory, MD [facility] Pager [phone]  If 7PM-7AM, please contact night-coverage [email or web address] Password [facility] 05/26/2018, 9:39 AM

## 2018-05-26 NOTE — Progress Notes (Signed)
  [facility] Progress Note  6 Days Post-Op  Subjective: CC-  Denies any abdominal pain. No n/v. Tolerating diet. Passing flatus but still has not had a BM.  Objective: Vital signs in last 24 hours: Temp:  [97.4 F (36.3 C)-99.1 F (37.3 C)] 99.1 F (37.3 C) (03/28 0501) Pulse Rate:  [67-78] 67 (03/28 0501) Resp:  [16-18] 16 (03/28 0501) BP: (133-140)/(54-76) 133/54 (03/28 0501) SpO2:  [97 %-99 %] 98 % (03/28 0501) Last BM Date: 05/19/18  Intake/Output from previous day: 03/27 0701 - 03/28 0700 In: 1476.6 [P.O.:1050; I.V.:426.6] Out: 1700 [Urine:1700] Intake/Output this shift: No intake/output data recorded.  PE: Gen: Alert, NAD, pleasant HEENT: EOM's intact, pupils equal and round Pulm: effort normal Abd: Soft, NT/ND, +BS,midline incision cdi with staples intact and no erythema or drainage, G-tube site cdi Skin: no rashes noted, warm and dry  Lab Results:  Recent Labs    05/25/18 0747 05/26/18 0327  WBC 8.6 8.2  HGB 11.2* 11.0*  HCT 30.0* 29.4*  PLT 277 283   BMET Recent Labs    05/25/18 0747 05/26/18 0327  NA 133* 131*  K 3.9 3.9  CL 102 103  CO2 23 21*  GLUCOSE 147* 129*  BUN <5* <5*  CREATININE 0.46 0.40*  CALCIUM 8.2* 8.3*   PT/INR No results for input(s): LABPROT, INR in the last 72 hours. CMP     Component Value Date/Time   NA 131 (L) 05/26/2018 0327   K 3.9 05/26/2018 0327   CL 103 05/26/2018 0327   CO2 21 (L) 05/26/2018 0327   GLUCOSE 129 (H) 05/26/2018 0327   BUN <5 (L) 05/26/2018 0327   CREATININE 0.40 (L) 05/26/2018 0327   CALCIUM 8.3 (L) 05/26/2018 0327   PROT 7.3 05/21/2018 0216   ALBUMIN 3.5 05/21/2018 0216   AST 23 05/21/2018 0216   ALT 13 05/21/2018 0216   ALKPHOS 44 05/21/2018 0216   BILITOT 0.8 05/21/2018 0216   GFRNONAA >60 05/26/2018 0327   GFRAA >60 05/26/2018 0327   Lipase     Component Value Date/Time   LIPASE 28 05/22/2018 0136       Studies/Results: No results  found.  Anti-infectives: Anti-infectives (From admission, onward)   None       Assessment/Plan DM HTN HLD Hypothyroidism  Gastric volvulus S/p EXPLORATORY LAPAROTOMY, REDUCTION OF GASTRIC VOLVULUS,  GASTROSTOMY TUBE PLACEMENT 24FR MALECOT 3/22 Dr. Janee Morn - POD6 - honeycomb dressing removed 3/27  ID - none FEN - KVO IVF, soft diet, Ensure VTE - SCDs, ok for chemical DVT prophylaxis from surgical standpoint Foley - out 3/24 Follow up - Dr. Janee Morn  Plan: Glycerin suppository today. Keep G-tube clamped. Continue soft diet. Continue ambulating. Home when she has a bowel movement. F/u info and discharge instructions on AVS.   LOS: 6 days    Franne Forts , [facility] 05/26/2018, 7:44 AM Pager: [phone] Mon-Thurs 7:00 am-4:30 pm Fri 7:00 am -11:30 AM Sat-Sun 7:00 am-11:30 am

## 2018-05-27 LAB — GLUCOSE, CAPILLARY
Glucose-Capillary: 129 mg/dL — ABNORMAL HIGH (ref 70–99)
Glucose-Capillary: 142 mg/dL — ABNORMAL HIGH (ref 70–99)
Glucose-Capillary: 197 mg/dL — ABNORMAL HIGH (ref 70–99)
Glucose-Capillary: 117 mg/dL — ABNORMAL HIGH (ref 70–99)

## 2018-05-27 MED ORDER — BISACODYL 10 MG RE SUPP
10.0000 mg | Freq: Once | RECTAL | Status: AC
  Administered 2018-05-27: 10 mg via RECTAL
  Filled 2018-05-27: qty 1

## 2018-05-27 MED ORDER — LEVOTHYROXINE SODIUM 88 MCG PO TABS
88.0000 ug | ORAL_TABLET | Freq: Every day | ORAL | Status: DC
  Administered 2018-05-27 – 2018-05-28 (×2): 88 ug via ORAL
  Filled 2018-05-27 (×2): qty 1

## 2018-05-27 NOTE — Progress Notes (Signed)
Pt has had some additional BMs this evening and states that she feels better.

## 2018-05-27 NOTE — Progress Notes (Signed)
Pt had a tiny BM this afternoon.

## 2018-05-27 NOTE — Progress Notes (Signed)
PROGRESS NOTE    Dana Saunders  EXH:[id number] DOB: 08-01-1930 DOA: 05/20/2018 PCP: Renford Dills, MD   Brief Narrative: 85 year old with past medical history significant for diabetes type 2, hypertension, hyperlipidemia who presented with abdominal pain, cough, shortness of breath.  CT showed gastric volvulus. Patient underwent emergent endoscopy and admission which showed changes of gastric volvulus with necrotic gastric mucosa.  This was discussed with Dr. Janee Morn, patient was taken to the OR for decompression, patient underwent exploratory laparotomy, reduction of gastric volvulus, gastrostomy tube placement 24 [place] on 3/22.    Assessment & Plan:   Principal Problem:   Gastric volvulus Active Problems:   Hyponatremia   Elevated lipase   Diabetes mellitus type 2, noninsulin dependent (HCC)   Hyperlipidemia   Benign essential HTN   Malnutrition of moderate degree   Hypokalemia   Hypomagnesemia  1-Gastric volvulus: Status post urgent endoscopy then exploratory laparoscopy with reduction and gastrostomy tube placement on 05/20/2018. Surgery following. Gastric tube was clamp 3-25. She started to pass flatus. Denies worsening abdominal pain.  Tolerating soft diet. Awaiting for patient to have Bowel movement to discharge home. Plan for dulcolax suppository today.  On miralax/docusate.   2-Hyponatremia: Hold hydrochlorothiazide. Continue with IV fluids.  Stable.   Hypokalemia/hypomagnesemia Replaced.   Non-insulin-dependent type 2 diabetic hold metformin and Januvia. Continue with a sliding scale insulin.  HTN;  Hydrochlorothiazide and losartan in the setting of hyponatremia and poor oral intake. On PRN hydralazine.  BP better control on Norvasc 10 mg daily.    Nutrition Problem: Moderate Malnutrition Etiology: acute illness    Signs/Symptoms: moderate muscle depletion, moderate fat depletion, energy intake < or equal to 50% for > or equal to 5 days     Interventions: Ensure Enlive (each supplement provides 350kcal and 20 grams of protein), MVI  Estimated body mass index is 25.69 kg/m as calculated from the following:   Height as of this encounter: 5\' 3"  (1.6 m).   Weight as of this encounter: 65.8 kg.   DVT prophylaxis:  Code Status: Full code Family Communication: Care discussed with patient Disposition Plan: Home when cleared by surgery  Consultants:   General surgery   Procedures:   3/22 status post urgent EGD then exploratory laparoscopy with reduction and gastrostomy tube placement  Antimicrobials:  None  Subjective: No abdominal pain.  Denies nausea. No BM yet   Objective: Vitals:   05/26/18 0501 05/26/18 1424 05/26/18 2039 05/27/18 0433  BP: (!) 133/54 119/62 (!) 117/50 133/60  Pulse: 67 70 74 65  Resp: 16 16 18 16   Temp: 99.1 F (37.3 C) 98.3 F (36.8 C) 98.4 F (36.9 C) 98.3 F (36.8 C)  TempSrc: Oral Oral Oral Oral  SpO2: 98% 94% 98% 97%  Weight:      Height:        Intake/Output Summary (Last 24 hours) at 05/27/2018 1258 Last data filed at 05/27/2018 3810 Gross per 24 hour  Intake 714.31 ml  Output -  Net 714.31 ml   Filed Weights   05/20/18 1218  Weight: 65.8 kg    Examination:  General exam: NAD Respiratory system: CTA Cardiovascular system: S 1, S 2 RRR Gastrointestinal system: BS present, soft, midline incision  with staple clean, g tube in place clamp.  Central nervous system: Non focal.  Extremities: symmetric power.  Skin: no rashes.    Data Reviewed: I have personally reviewed following labs and imaging studies  CBC: Recent Labs  Lab 05/22/18 0136 05/23/18 0153 05/24/18 0213 05/25/18  0747 05/26/18 0327  WBC 14.4* 11.9* 9.5 8.6 8.2  NEUTROABS 11.4* 9.4*  --   --   --   HGB 11.5* 11.6* 11.5* 11.2* 11.0*  HCT 30.7* 30.9* 32.0* 30.0* 29.4*  MCV 78.3* 77.4* 78.0* 77.7* 77.2*  PLT 184 202 231 277 283   Basic Metabolic Panel: Recent Labs  Lab 05/22/18 0136 05/23/18  0153 05/24/18 0213 05/25/18 0747 05/26/18 0327  NA 132* 130* 130* 133* 131*  K 2.8* 3.2* 3.6 3.9 3.9  CL 99 99 100 102 103  CO2 21* 23 21* 23 21*  GLUCOSE 88 170* 124* 147* 129*  BUN <5* <5* <5* <5* <5*  CREATININE 0.44 0.43* 0.44 0.46 0.40*  CALCIUM 7.3* 7.7* 8.1* 8.2* 8.3*  MG 1.3* 1.9  --   --   --    GFR: Estimated Creatinine Clearance: 46.9 mL/min (A) (by C-G formula based on SCr of 0.4 mg/dL (L)). Liver Function Tests: Recent Labs  Lab 05/21/18 0216  AST 23  ALT 13  ALKPHOS 44  BILITOT 0.8  PROT 7.3  ALBUMIN 3.5   Recent Labs  Lab 05/22/18 0136  LIPASE 28   No results for input(s): AMMONIA in the last 168 hours. Coagulation Profile: No results for input(s): INR, PROTIME in the last 168 hours. Cardiac Enzymes: No results for input(s): CKTOTAL, CKMB, CKMBINDEX, TROPONINI in the last 168 hours. BNP (last 3 results) No results for input(s): PROBNP in the last 8760 hours. HbA1C: No results for input(s): HGBA1C in the last 72 hours. CBG: Recent Labs  Lab 05/26/18 1234 05/26/18 1604 05/26/18 2037 05/27/18 0739 05/27/18 1159  GLUCAP 215* 141* 148* 129* 142*   Lipid Profile: No results for input(s): CHOL, HDL, LDLCALC, TRIG, CHOLHDL, LDLDIRECT in the last 72 hours. Thyroid Function Tests: No results for input(s): TSH, T4TOTAL, FREET4, T3FREE, THYROIDAB in the last 72 hours. Anemia Panel: No results for input(s): VITAMINB12, FOLATE, FERRITIN, TIBC, IRON, RETICCTPCT in the last 72 hours. Sepsis Labs: Recent Labs  Lab 05/20/18 2204  LATICACIDVEN 2.2*    No results found for this or any previous visit (from the past 240 hour(s)).       Radiology Studies: No results found.      Scheduled Meds: . amLODipine  10 mg Oral Daily  . docusate sodium  100 mg Oral BID  . enoxaparin (LOVENOX) injection  40 mg Subcutaneous Q24H  . feeding supplement (ENSURE ENLIVE)  237 mL Oral BID BM  . insulin aspart  0-9 Units Subcutaneous TID WC  . [START ON  05/28/2018] levothyroxine  88 mcg Oral Q0600  . magnesium oxide  200 mg Oral BID  . multivitamin with minerals  1 tablet Oral Daily  . polyethylene glycol  17 g Oral Daily  . sodium chloride flush  3 mL Intravenous Q12H   Continuous Infusions: . sodium chloride Stopped (05/22/18 1621)  . sodium chloride Stopped (05/26/18 0825)     LOS: 7 days    Time spent: 35 minutes.     Alba Cory, MD [facility] Pager [phone]  If 7PM-7AM, please contact night-coverage [email or web address] Password [facility] 05/27/2018, 12:58 PM

## 2018-05-27 NOTE — Progress Notes (Signed)
7 Days Post-Op   Subjective/Chief Complaint: Tolerating a soft diet Denies abdominal pain Still has had no BM   Objective: Vital signs in last 24 hours: Temp:  [98.3 F (36.8 C)-98.4 F (36.9 C)] 98.3 F (36.8 C) (03/29 0433) Pulse Rate:  [65-74] 65 (03/29 0433) Resp:  [16-18] 16 (03/29 0433) BP: (117-133)/(50-62) 133/60 (03/29 0433) SpO2:  [94 %-98 %] 97 % (03/29 0433) Last BM Date: 05/19/18  Intake/Output from previous day: 03/28 0701 - 03/29 0700 In: 714.3 [P.O.:690; I.V.:24.3] Out: -  Intake/Output this shift: Total I/O In: 225 [P.O.:225] Out: -   Exam: Abdomen soft, non-tender, non-distended Tube in place  Lab Results:  Recent Labs    05/25/18 0747 05/26/18 0327  WBC 8.6 8.2  HGB 11.2* 11.0*  HCT 30.0* 29.4*  PLT 277 283   BMET Recent Labs    05/25/18 0747 05/26/18 0327  NA 133* 131*  K 3.9 3.9  CL 102 103  CO2 23 21*  GLUCOSE 147* 129*  BUN <5* <5*  CREATININE 0.46 0.40*  CALCIUM 8.2* 8.3*   PT/INR No results for input(s): LABPROT, INR in the last 72 hours. ABG No results for input(s): PHART, HCO3 in the last 72 hours.  Invalid input(s): PCO2, PO2  Studies/Results: No results found.  Anti-infectives: Anti-infectives (From admission, onward)   None      Assessment/Plan: s/p Procedure(s): EXPLORATORY LAPAROTOMY (N/A) Insertion Of Gastrostomy Tube and Repair of Volvulus (N/A)  Patient does not want to go home until she has a BM.  Will repeat a suppository.  LOS: 7 days    Abigail Miyamoto 05/27/2018

## 2018-05-28 LAB — GLUCOSE, CAPILLARY: Glucose-Capillary: 132 mg/dL — ABNORMAL HIGH (ref 70–99)

## 2018-05-28 MED ORDER — ACETAMINOPHEN 325 MG PO TABS: 650.0000 mg | ORAL_TABLET | Freq: Four times a day (QID) | ORAL | 0 refills | Status: DC | PRN

## 2018-05-28 MED ORDER — ADULT MULTIVITAMIN W/MINERALS CH: 1.0000 | ORAL_TABLET | Freq: Every day | ORAL | 0 refills | Status: AC

## 2018-05-28 MED ORDER — DOCUSATE SODIUM 100 MG PO CAPS: 100.0000 mg | ORAL_CAPSULE | Freq: Two times a day (BID) | ORAL | 0 refills | Status: DC

## 2018-05-28 MED ORDER — POLYETHYLENE GLYCOL 3350 17 G PO PACK: 17.0000 g | PACK | Freq: Every day | ORAL | 0 refills | Status: AC

## 2018-05-28 MED ORDER — ENSURE ENLIVE PO LIQD: 237.0000 mL | Freq: Two times a day (BID) | ORAL | 12 refills | Status: AC

## 2018-05-28 MED ORDER — AMLODIPINE BESYLATE 10 MG PO TABS: 10.0000 mg | ORAL_TABLET | Freq: Every day | ORAL | 0 refills | Status: DC

## 2018-05-28 NOTE — Discharge Summary (Signed)
Physician Discharge Summary  Fortunata Vary VPX:[id number] DOB: June 09, 1930 DOA: 05/20/2018  PCP: Renford Dills, MD  Admit date: 05/20/2018 Discharge date: 05/28/2018  Admitted From: Home  Disposition:  Home   Recommendations for Outpatient Follow-up:  1. Follow up with PCP in 1-2 weeks 2. Please obtain BMP/CBC in one week 3. Needs to follow up with sx for post operative 4. Follow up with PCP for NP and to follow on hyponatremia.   Home Health: yes.   Discharge Condition: stable.  CODE STATUS: full code Diet recommendation:  Carb Modified   Brief/Interim Summary:  85 year old with past medical history significant for diabetes type 2, hypertension, hyperlipidemia who presented with abdominal pain, cough, shortness of breath.  CT showed gastric volvulus. Patient underwent emergent endoscopy and admission which showed changes of gastric volvulus with necrotic gastric mucosa.  This was discussed with Dr. Janee Morn, patient was taken to the OR for decompression, patient underwent exploratory laparotomy, reduction of gastric volvulus, gastrostomy tube placement 24 [place] on 3/22.  1-Gastric volvulus: Status post urgent endoscopy then exploratory laparoscopy with reduction and gastrostomy tube placement on 05/20/2018. Surgery following. Gastric tube was clamp 3-25. She started to pass flatus. Denies worsening abdominal pain.  Tolerating soft diet. Had BM 3-29. Patient feeling better today.  Plan to discharge today. Needs to follow up with sx post op.  She will be discharge with G tube in place, clamp.   2-Hyponatremia: Hold hydrochlorothiazide. Continue with IV fluids.  Stable.   Hypokalemia/hypomagnesemia Replaced.   Non-insulin-dependent type 2 diabetic Resume  metformin and Januvia. Continue with a sliding scale insulin.  HTN;  Hydrochlorothiazide and losartan in the setting of hyponatremia and poor oral intake. On PRN hydralazine.  BP better control on Norvasc 10 mg  daily.   Moderate malnutrition;  On nutrition supplement.   Discharge Diagnoses:  Principal Problem:   Gastric volvulus Active Problems:   Hyponatremia   Elevated lipase   Diabetes mellitus type 2, noninsulin dependent (HCC)   Hyperlipidemia   Benign essential HTN   Malnutrition of moderate degree   Hypokalemia   Hypomagnesemia    Discharge Instructions  Discharge Instructions    Diet - low sodium heart healthy   Complete by:  As directed    Increase activity slowly   Complete by:  As directed      Allergies as of 05/28/2018   No Known Allergies     Medication List    STOP taking these medications   hydrochlorothiazide 12.5 MG tablet Commonly known as:  HYDRODIURIL   losartan 50 MG tablet Commonly known as:  COZAAR     TAKE these medications   acetaminophen 325 MG tablet Commonly known as:  TYLENOL Take 2 tablets (650 mg total) by mouth every 6 (six) hours as needed for mild pain (or Fever >/= 101).   amLODipine 10 MG tablet Commonly known as:  NORVASC Take 1 tablet (10 mg total) by mouth daily.   cetirizine 10 MG tablet Commonly known as:  ZYRTEC Take 10 mg by mouth daily.   docusate sodium 100 MG capsule Commonly known as:  COLACE Take 1 capsule (100 mg total) by mouth 2 (two) times daily.   feeding supplement (ENSURE ENLIVE) Liqd Take 237 mLs by mouth 2 (two) times daily between meals.   levothyroxine 88 MCG tablet Commonly known as:  SYNTHROID, LEVOTHROID Take 88 mcg by mouth every morning.   metFORMIN 500 MG tablet Commonly known as:  GLUCOPHAGE Take 1,000 mg by mouth 2 (two) times  daily.   multivitamin with minerals Tabs tablet Take 1 tablet by mouth daily.   polyethylene glycol packet Commonly known as:  MIRALAX / GLYCOLAX Take 17 g by mouth daily.   pravastatin 40 MG tablet Commonly known as:  PRAVACHOL Take 40 mg by mouth daily.   sitaGLIPtin 50 MG tablet Commonly known as:  JANUVIA Take 50 mg by mouth daily.             Durable Medical Equipment  (From admission, onward)         Start     Ordered   05/25/18 0716  For home use only DME 3 n 1  Once     05/25/18 0715         Follow-up Information    [facility] Go on 06/04/2018.   Specialty:  General Surgery Why:  Your appointment is 4/6 at 10am with one of our nurses to have your staples removed. Please arrive 30 minutes prior to your appointment to check in and fill out paperwork. Bring photo ID and insurance information. Contact information: [address] [place] [address] [phone]       Violeta Gelinas, MD. Nyra Capes on 06/27/2018.   Specialty:  General Surgery Why:  Your appointment is 4/29 at 9:20am to follow up from your recent surgery. Please arrive 15 minutes early to check in. Contact information: [address] [place] [address] [phone]          No Known Allergies  Consultations: Surgery GI  Procedures/Studies: Dg Chest 2 View  Result Date: 05/20/2018 CLINICAL DATA:  Abdominal pain, cough and shortness of breath since yesterday. EXAM: CHEST - 2 VIEW COMPARISON:  Chest x-ray dated 10/12/2017. FINDINGS: Heart size and mediastinal contours are within normal limits. Lungs are clear. No pleural effusion or pneumothorax seen. Large amount of air under the LEFT hemidiaphragm. This is most likely within stomach or colon, with associated air-fluid level. Similar appearance demonstrated on earlier chest x-rays of 10/12/2017 and 06/06/2012. Osseous structures about the chest are unremarkable. IMPRESSION: 1. No active cardiopulmonary disease. No evidence of pneumonia or pulmonary edema. 2. Large amount of air under the LEFT hemidiaphragm, most likely air within distended stomach or colon with associated air-fluid level. Given the history of abdominal pain, would consider CT abdomen and pelvis to exclude associated bowel obstruction and to exclude the less likely  possibility of free intraperitoneal air. Electronically Signed   By: Bary Richard M.D.   On: 05/20/2018 13:08   Ct Abdomen Pelvis W Contrast  Result Date: 05/20/2018 CLINICAL DATA:  Abdominal pain, cough and shortness of breath beginning yesterday. EXAM: CT ABDOMEN AND PELVIS WITH CONTRAST TECHNIQUE: Multidetector CT imaging of the abdomen and pelvis was performed using the standard protocol following bolus administration of intravenous contrast. CONTRAST:  100 mL OMNIPAQUE IOHEXOL 300 MG/ML  SOLN COMPARISON:  None. FINDINGS: Lower chest: Lung bases are clear. No pleural or pericardial effusion. Hepatobiliary: No focal liver abnormality is seen. No gallstones, gallbladder wall thickening, or biliary dilatation. Pancreas: Unremarkable. No pancreatic ductal dilatation or surrounding inflammatory changes. Spleen: Normal in size without focal abnormality. Adrenals/Urinary Tract: Adrenal glands are unremarkable. Kidneys are normal, without renal calculi, solid lesion, or hydronephrosis. Small left renal cyst noted. Bladder is unremarkable. Stomach/Bowel: The stomach is massively distended. The first portion of the duodenum and pylorus are displaced superiorly and posteriorly anterior to the aorta highly suspicious for gastric volvulus. The remainder of the small bowel  is unremarkable. The colon is largely decompressed. Vascular/Lymphatic: Aortic atherosclerosis. No enlarged abdominal or pelvic lymph nodes. Reproductive: Calcified uterine fibroids noted. Other: No free intraperitoneal air. Musculoskeletal: No acute or focal bony abnormality. Convex right lumbar scoliosis. Bilateral hip osteoarthritis is severe on the right. There also degenerative disease about the sacroiliac joints. IMPRESSION: Findings highly suspicious for gastric volvulus. Atherosclerosis. Critical Value/emergent results were called by telephone at the time of interpretation on 05/20/2018 at 2:48 pm to Dr. Gerhard Munch , who verbally  acknowledged these results. Electronically Signed   By: Drusilla Kanner M.D.   On: 05/20/2018 14:48   Dg Abd Portable 1 View  Result Date: 05/20/2018 CLINICAL DATA:  Status post NG tube placement. EXAM: PORTABLE ABDOMEN - 1 VIEW COMPARISON:  None. FINDINGS: NG tube is in place with the tip in the stomach. Side-port is at the gastroesophageal junction. Recommend advancement of 3-4 cm. IMPRESSION: As above. Electronically Signed   By: Drusilla Kanner M.D.   On: 05/20/2018 16:01      Subjective: Feeling well, denies abdominal pain. Had NM last night.   Discharge Exam: Vitals:   05/27/18 2145 05/28/18 0535  BP: 123/63 122/68  Pulse: 66 70  Resp: 17 17  Temp: 97.9 F (36.6 C) 98 F (36.7 C)  SpO2: 93% 99%     General: Pt is alert, awake, not in acute distress Cardiovascular: RRR, S1/S2 +, no rubs, no gallops Respiratory: CTA bilaterally, no wheezing, no rhonchi Abdominal: Soft, NT, ND, bowel sounds + incision with staples, healing, G tube clamp./  Extremities: no edema, no cyanosis    The results of significant diagnostics from this hospitalization (including imaging, microbiology, ancillary and laboratory) are listed below for reference.     Microbiology: No results found for this or any previous visit (from the past 240 hour(s)).   Labs: BNP (last 3 results) No results for input(s): BNP in the last 8760 hours. Basic Metabolic Panel: Recent Labs  Lab 05/22/18 0136 05/23/18 0153 05/24/18 0213 05/25/18 0747 05/26/18 0327  NA 132* 130* 130* 133* 131*  K 2.8* 3.2* 3.6 3.9 3.9  CL 99 99 100 102 103  CO2 21* 23 21* 23 21*  GLUCOSE 88 170* 124* 147* 129*  BUN <5* <5* <5* <5* <5*  CREATININE 0.44 0.43* 0.44 0.46 0.40*  CALCIUM 7.3* 7.7* 8.1* 8.2* 8.3*  MG 1.3* 1.9  --   --   --    Liver Function Tests: No results for input(s): AST, ALT, ALKPHOS, BILITOT, PROT, ALBUMIN in the last 168 hours. Recent Labs  Lab 05/22/18 0136  LIPASE 28   No results for input(s):  AMMONIA in the last 168 hours. CBC: Recent Labs  Lab 05/22/18 0136 05/23/18 0153 05/24/18 0213 05/25/18 0747 05/26/18 0327  WBC 14.4* 11.9* 9.5 8.6 8.2  NEUTROABS 11.4* 9.4*  --   --   --   HGB 11.5* 11.6* 11.5* 11.2* 11.0*  HCT 30.7* 30.9* 32.0* 30.0* 29.4*  MCV 78.3* 77.4* 78.0* 77.7* 77.2*  PLT 184 202 231 277 283   Cardiac Enzymes: No results for input(s): CKTOTAL, CKMB, CKMBINDEX, TROPONINI in the last 168 hours. BNP: Invalid input(s): POCBNP CBG: Recent Labs  Lab 05/26/18 2037 05/27/18 0739 05/27/18 1159 05/27/18 1600 05/27/18 2143  GLUCAP 148* 129* 142* 197* 117*   D-Dimer No results for input(s): DDIMER in the last 72 hours. Hgb A1c No results for input(s): HGBA1C in the last 72 hours. Lipid Profile No results for input(s): CHOL, HDL, LDLCALC, TRIG, CHOLHDL,  LDLDIRECT in the last 72 hours. Thyroid function studies No results for input(s): TSH, T4TOTAL, T3FREE, THYROIDAB in the last 72 hours.  Invalid input(s): FREET3 Anemia work up No results for input(s): VITAMINB12, FOLATE, FERRITIN, TIBC, IRON, RETICCTPCT in the last 72 hours. Urinalysis No results found for: COLORURINE, APPEARANCEUR, LABSPEC, PHURINE, GLUCOSEU, HGBUR, BILIRUBINUR, KETONESUR, PROTEINUR, UROBILINOGEN, NITRITE, LEUKOCYTESUR Sepsis Labs Invalid input(s): PROCALCITONIN,  WBC,  LACTICIDVEN Microbiology No results found for this or any previous visit (from the past 240 hour(s)).   Time coordinating discharge: 40 minutes  SIGNED:   Alba Cory, MD  [facility]

## 2018-05-28 NOTE — TOC Transition Note (Signed)
Transition of Care [facility]) - CM/SW Discharge Note   Patient Details  Name: Dana Saunders MRN: [id number] Date of Birth: 1931/01/19  [facility]) CM/SW Contact:  Kingsley Plan, RN Phone Number: 05/28/2018, 10:42 AM   Clinical Narrative:     Patient discharged to home with [facility] and 3 in 1   Final next level of care: Home w Home Health Services Barriers to Discharge: No Barriers Identified   Patient Goals and CMS Choice Patient states their goals for this hospitalization and ongoing recovery are:: to go home  CMS Medicare.gov Compare Post Acute Care list provided to:: Patient Choice offered to / list presented to : Patient  Discharge Placement                       Discharge Plan and Services   Discharge Planning Services: CM Consult Post Acute Care Choice: Durable Medical Equipment          DME Arranged: 3-N-1 DME Agency: [facility] HH Arranged: OT HH Agency: [facility] (now [facility])   Social Determinants of Health (SDOH) Interventions     Readmission Risk Interventions No flowsheet data found.

## 2018-05-28 NOTE — Progress Notes (Signed)
Occupational Therapy Treatment Patient Details Name: Dana Saunders MRN: [id number] DOB: 1930-06-20 Today's Date: 05/28/2018    History of present illness Pt is an 85 y/o female admitted secondary to gastric volvulus. Pt is s/p exploratory laparotomy with insertion for Gastrostomy tube and repair of volvulus. PMH includes HTN and DM.    OT comments  Pt progressing towards established OT goals. Pt very excited about dc later today. Pt performing grooming with supervision and peri care with Min Guard while standing at sink. Pt demonstrating increased activity tolerance. Recommend dc to home with HHOT and answered all questions in preparation for dc home.    Follow Up Recommendations  Home health OT;Supervision/Assistance - 24 hour    Equipment Recommendations  3 in 1 bedside commode(3N1 delivered to room)    Recommendations for Other Services      Precautions / Restrictions Precautions Precautions: Fall;Other (comment) Precaution Comments: G tube Restrictions Weight Bearing Restrictions: No       Mobility Bed Mobility Overal bed mobility: Needs Assistance Bed Mobility: Sit to Sidelying     Supine to sit: Supervision     General bed mobility comments: supervision for safety  Transfers Overall transfer level: Needs assistance Equipment used: Rolling walker (2 wheeled) Transfers: Sit to/from Stand Sit to Stand: Min guard         General transfer comment: Min Guard A for safety'    Balance Overall balance assessment: Needs assistance Sitting-balance support: No upper extremity supported;Feet supported Sitting balance-Leahy Scale: Fair     Standing balance support: Bilateral upper extremity supported;During functional activity Standing balance-Leahy Scale: Fair Standing balance comment: able to perform peri care at sink                           ADL either performed or assessed with clinical judgement   ADL Overall ADL's : Needs  assistance/impaired     Grooming: Wash/dry hands;Standing;Wash/dry face;Oral care;Supervision/safety;Set up Grooming Details (indicate cue type and reason): Pt performing grooming at sink with supervision for safety.      Lower Body Bathing: Min guard;Sit to/from stand Lower Body Bathing Details (indicate cue type and reason): Pt performing LB bathing at sink to clean peri area. Min Guard A for Community education officer: Min guard;Ambulation;RW(simulated to recliner) Toilet Transfer Details (indicate cue type and reason): Discussed use of 3N1 over toilet at home         Functional mobility during ADLs: Min guard;Rolling walker General ADL Comments: Pt very excited about dc home today. Performing grooming and peri care at sink and then sitting up in recliner. Pt reporting her daughter is bringing her clothes     Vision       Perception     Praxis      Cognition Arousal/Alertness: Awake/alert Behavior During Therapy: WFL for tasks assessed/performed Overall Cognitive Status: Within Functional Limits for tasks assessed                                          Exercises     Shoulder Instructions       General Comments      Pertinent Vitals/ Pain       Pain Assessment: No/denies pain  Home Living  Prior Functioning/Environment              Frequency  Min 2X/week        Progress Toward Goals  OT Goals(current goals can now be found in the care plan section)  Progress towards OT goals: Progressing toward goals  Acute Rehab OT Goals Patient Stated Goal: to go home  OT Goal Formulation: With patient Time For Goal Achievement: 06/06/18 Potential to Achieve Goals: Good ADL Goals Pt Will Transfer to Toilet: with supervision;ambulating;bedside commode Additional ADL Goal #1: pt will complete adl with min A using reacher for pants and LB bathing (no socks/shoes Additional  ADL Goal #2: pt will initiate at least oe rest break for energy conservation  Plan Discharge plan remains appropriate    Co-evaluation                 AM-PAC OT "6 Clicks" Daily Activity     Outcome Measure   Help from another person eating meals?: None Help from another person taking care of personal grooming?: A Little Help from another person toileting, which includes using toliet, bedpan, or urinal?: A Little Help from another person bathing (including washing, rinsing, drying)?: A Little Help from another person to put on and taking off regular upper body clothing?: A Little Help from another person to put on and taking off regular lower body clothing?: A Lot 6 Click Score: 18    End of Session Equipment Utilized During Treatment: Rolling walker  OT Visit Diagnosis: Unsteadiness on feet (R26.81);Muscle weakness (generalized) (M62.81)   Activity Tolerance Patient tolerated treatment well   Patient Left in chair;with call bell/phone within reach;with chair alarm set   Nurse Communication Mobility status        Time: 0811-0829 OT Time Calculation (min): 18 min  Charges: OT General Charges $OT Visit: 1 Visit OT Treatments $Self Care/Home Management : 8-22 mins  Hayzlee Mcsorley MSOT, OTR/L Acute Rehab Pager: [phone] Office: [phone]   Theodoro Grist Genevie Elman 05/28/2018, 8:48 AM

## 2018-05-28 NOTE — Progress Notes (Signed)
Patient discharged to home with instructions and equipment. °

## 2018-05-30 DIAGNOSIS — M1611 Unilateral primary osteoarthritis, right hip: Secondary | ICD-10-CM | POA: Diagnosis not present

## 2018-05-30 DIAGNOSIS — Z48815 Encounter for surgical aftercare following surgery on the digestive system: Secondary | ICD-10-CM | POA: Diagnosis not present

## 2018-05-30 DIAGNOSIS — F0390 Unspecified dementia without behavioral disturbance: Secondary | ICD-10-CM | POA: Diagnosis not present

## 2018-05-30 DIAGNOSIS — E7800 Pure hypercholesterolemia, unspecified: Secondary | ICD-10-CM | POA: Diagnosis not present

## 2018-05-30 DIAGNOSIS — E039 Hypothyroidism, unspecified: Secondary | ICD-10-CM | POA: Diagnosis not present

## 2018-05-30 DIAGNOSIS — R296 Repeated falls: Secondary | ICD-10-CM | POA: Diagnosis not present

## 2018-05-30 DIAGNOSIS — M810 Age-related osteoporosis without current pathological fracture: Secondary | ICD-10-CM | POA: Diagnosis not present

## 2018-05-30 DIAGNOSIS — E119 Type 2 diabetes mellitus without complications: Secondary | ICD-10-CM | POA: Diagnosis not present

## 2018-05-30 DIAGNOSIS — I10 Essential (primary) hypertension: Secondary | ICD-10-CM | POA: Diagnosis not present

## 2018-05-31 DIAGNOSIS — F0390 Unspecified dementia without behavioral disturbance: Secondary | ICD-10-CM | POA: Diagnosis not present

## 2018-05-31 DIAGNOSIS — M1611 Unilateral primary osteoarthritis, right hip: Secondary | ICD-10-CM | POA: Diagnosis not present

## 2018-05-31 DIAGNOSIS — R296 Repeated falls: Secondary | ICD-10-CM | POA: Diagnosis not present

## 2018-05-31 DIAGNOSIS — E119 Type 2 diabetes mellitus without complications: Secondary | ICD-10-CM | POA: Diagnosis not present

## 2018-05-31 DIAGNOSIS — Z48815 Encounter for surgical aftercare following surgery on the digestive system: Secondary | ICD-10-CM | POA: Diagnosis not present

## 2018-05-31 DIAGNOSIS — M810 Age-related osteoporosis without current pathological fracture: Secondary | ICD-10-CM | POA: Diagnosis not present

## 2018-05-31 DIAGNOSIS — I10 Essential (primary) hypertension: Secondary | ICD-10-CM | POA: Diagnosis not present

## 2018-05-31 DIAGNOSIS — E039 Hypothyroidism, unspecified: Secondary | ICD-10-CM | POA: Diagnosis not present

## 2018-05-31 DIAGNOSIS — E7800 Pure hypercholesterolemia, unspecified: Secondary | ICD-10-CM | POA: Diagnosis not present

## 2018-06-01 DIAGNOSIS — E039 Hypothyroidism, unspecified: Secondary | ICD-10-CM | POA: Diagnosis not present

## 2018-06-01 DIAGNOSIS — M810 Age-related osteoporosis without current pathological fracture: Secondary | ICD-10-CM | POA: Diagnosis not present

## 2018-06-01 DIAGNOSIS — E7800 Pure hypercholesterolemia, unspecified: Secondary | ICD-10-CM | POA: Diagnosis not present

## 2018-06-01 DIAGNOSIS — I10 Essential (primary) hypertension: Secondary | ICD-10-CM | POA: Diagnosis not present

## 2018-06-01 DIAGNOSIS — K3189 Other diseases of stomach and duodenum: Secondary | ICD-10-CM | POA: Diagnosis not present

## 2018-06-01 DIAGNOSIS — E1169 Type 2 diabetes mellitus with other specified complication: Secondary | ICD-10-CM | POA: Diagnosis not present

## 2018-06-06 DIAGNOSIS — Z48815 Encounter for surgical aftercare following surgery on the digestive system: Secondary | ICD-10-CM | POA: Diagnosis not present

## 2018-06-06 DIAGNOSIS — M1611 Unilateral primary osteoarthritis, right hip: Secondary | ICD-10-CM | POA: Diagnosis not present

## 2018-06-06 DIAGNOSIS — R296 Repeated falls: Secondary | ICD-10-CM | POA: Diagnosis not present

## 2018-06-06 DIAGNOSIS — M810 Age-related osteoporosis without current pathological fracture: Secondary | ICD-10-CM | POA: Diagnosis not present

## 2018-06-06 DIAGNOSIS — I10 Essential (primary) hypertension: Secondary | ICD-10-CM | POA: Diagnosis not present

## 2018-06-06 DIAGNOSIS — E119 Type 2 diabetes mellitus without complications: Secondary | ICD-10-CM | POA: Diagnosis not present

## 2018-06-06 DIAGNOSIS — E7800 Pure hypercholesterolemia, unspecified: Secondary | ICD-10-CM | POA: Diagnosis not present

## 2018-06-06 DIAGNOSIS — F0390 Unspecified dementia without behavioral disturbance: Secondary | ICD-10-CM | POA: Diagnosis not present

## 2018-06-06 DIAGNOSIS — E039 Hypothyroidism, unspecified: Secondary | ICD-10-CM | POA: Diagnosis not present

## 2018-06-07 DIAGNOSIS — Z48815 Encounter for surgical aftercare following surgery on the digestive system: Secondary | ICD-10-CM | POA: Diagnosis not present

## 2018-06-07 DIAGNOSIS — E7800 Pure hypercholesterolemia, unspecified: Secondary | ICD-10-CM | POA: Diagnosis not present

## 2018-06-07 DIAGNOSIS — M810 Age-related osteoporosis without current pathological fracture: Secondary | ICD-10-CM | POA: Diagnosis not present

## 2018-06-07 DIAGNOSIS — R296 Repeated falls: Secondary | ICD-10-CM | POA: Diagnosis not present

## 2018-06-07 DIAGNOSIS — I10 Essential (primary) hypertension: Secondary | ICD-10-CM | POA: Diagnosis not present

## 2018-06-07 DIAGNOSIS — E119 Type 2 diabetes mellitus without complications: Secondary | ICD-10-CM | POA: Diagnosis not present

## 2018-06-07 DIAGNOSIS — F0390 Unspecified dementia without behavioral disturbance: Secondary | ICD-10-CM | POA: Diagnosis not present

## 2018-06-07 DIAGNOSIS — M1611 Unilateral primary osteoarthritis, right hip: Secondary | ICD-10-CM | POA: Diagnosis not present

## 2018-06-07 DIAGNOSIS — E039 Hypothyroidism, unspecified: Secondary | ICD-10-CM | POA: Diagnosis not present

## 2018-06-12 DIAGNOSIS — E039 Hypothyroidism, unspecified: Secondary | ICD-10-CM | POA: Diagnosis not present

## 2018-06-12 DIAGNOSIS — E119 Type 2 diabetes mellitus without complications: Secondary | ICD-10-CM | POA: Diagnosis not present

## 2018-06-12 DIAGNOSIS — Z48815 Encounter for surgical aftercare following surgery on the digestive system: Secondary | ICD-10-CM | POA: Diagnosis not present

## 2018-06-12 DIAGNOSIS — R296 Repeated falls: Secondary | ICD-10-CM | POA: Diagnosis not present

## 2018-06-12 DIAGNOSIS — I10 Essential (primary) hypertension: Secondary | ICD-10-CM | POA: Diagnosis not present

## 2018-06-12 DIAGNOSIS — M1611 Unilateral primary osteoarthritis, right hip: Secondary | ICD-10-CM | POA: Diagnosis not present

## 2018-06-12 DIAGNOSIS — M810 Age-related osteoporosis without current pathological fracture: Secondary | ICD-10-CM | POA: Diagnosis not present

## 2018-06-12 DIAGNOSIS — E7800 Pure hypercholesterolemia, unspecified: Secondary | ICD-10-CM | POA: Diagnosis not present

## 2018-06-12 DIAGNOSIS — F0390 Unspecified dementia without behavioral disturbance: Secondary | ICD-10-CM | POA: Diagnosis not present

## 2018-06-14 DIAGNOSIS — E119 Type 2 diabetes mellitus without complications: Secondary | ICD-10-CM | POA: Diagnosis not present

## 2018-06-14 DIAGNOSIS — E7800 Pure hypercholesterolemia, unspecified: Secondary | ICD-10-CM | POA: Diagnosis not present

## 2018-06-14 DIAGNOSIS — M810 Age-related osteoporosis without current pathological fracture: Secondary | ICD-10-CM | POA: Diagnosis not present

## 2018-06-14 DIAGNOSIS — M1611 Unilateral primary osteoarthritis, right hip: Secondary | ICD-10-CM | POA: Diagnosis not present

## 2018-06-14 DIAGNOSIS — Z48815 Encounter for surgical aftercare following surgery on the digestive system: Secondary | ICD-10-CM | POA: Diagnosis not present

## 2018-06-14 DIAGNOSIS — E039 Hypothyroidism, unspecified: Secondary | ICD-10-CM | POA: Diagnosis not present

## 2018-06-14 DIAGNOSIS — I10 Essential (primary) hypertension: Secondary | ICD-10-CM | POA: Diagnosis not present

## 2018-06-14 DIAGNOSIS — R296 Repeated falls: Secondary | ICD-10-CM | POA: Diagnosis not present

## 2018-06-14 DIAGNOSIS — F0390 Unspecified dementia without behavioral disturbance: Secondary | ICD-10-CM | POA: Diagnosis not present

## 2018-06-19 DIAGNOSIS — Z48815 Encounter for surgical aftercare following surgery on the digestive system: Secondary | ICD-10-CM | POA: Diagnosis not present

## 2018-06-19 DIAGNOSIS — F0390 Unspecified dementia without behavioral disturbance: Secondary | ICD-10-CM | POA: Diagnosis not present

## 2018-06-19 DIAGNOSIS — I10 Essential (primary) hypertension: Secondary | ICD-10-CM | POA: Diagnosis not present

## 2018-06-19 DIAGNOSIS — E039 Hypothyroidism, unspecified: Secondary | ICD-10-CM | POA: Diagnosis not present

## 2018-06-19 DIAGNOSIS — E7800 Pure hypercholesterolemia, unspecified: Secondary | ICD-10-CM | POA: Diagnosis not present

## 2018-06-19 DIAGNOSIS — M1611 Unilateral primary osteoarthritis, right hip: Secondary | ICD-10-CM | POA: Diagnosis not present

## 2018-06-19 DIAGNOSIS — M810 Age-related osteoporosis without current pathological fracture: Secondary | ICD-10-CM | POA: Diagnosis not present

## 2018-06-19 DIAGNOSIS — E119 Type 2 diabetes mellitus without complications: Secondary | ICD-10-CM | POA: Diagnosis not present

## 2018-06-19 DIAGNOSIS — R296 Repeated falls: Secondary | ICD-10-CM | POA: Diagnosis not present

## 2018-06-21 DIAGNOSIS — Z48815 Encounter for surgical aftercare following surgery on the digestive system: Secondary | ICD-10-CM | POA: Diagnosis not present

## 2018-06-21 DIAGNOSIS — E039 Hypothyroidism, unspecified: Secondary | ICD-10-CM | POA: Diagnosis not present

## 2018-06-21 DIAGNOSIS — M1611 Unilateral primary osteoarthritis, right hip: Secondary | ICD-10-CM | POA: Diagnosis not present

## 2018-06-21 DIAGNOSIS — I10 Essential (primary) hypertension: Secondary | ICD-10-CM | POA: Diagnosis not present

## 2018-06-21 DIAGNOSIS — R296 Repeated falls: Secondary | ICD-10-CM | POA: Diagnosis not present

## 2018-06-21 DIAGNOSIS — E7800 Pure hypercholesterolemia, unspecified: Secondary | ICD-10-CM | POA: Diagnosis not present

## 2018-06-21 DIAGNOSIS — E119 Type 2 diabetes mellitus without complications: Secondary | ICD-10-CM | POA: Diagnosis not present

## 2018-06-21 DIAGNOSIS — M810 Age-related osteoporosis without current pathological fracture: Secondary | ICD-10-CM | POA: Diagnosis not present

## 2018-06-21 DIAGNOSIS — F0390 Unspecified dementia without behavioral disturbance: Secondary | ICD-10-CM | POA: Diagnosis not present

## 2018-06-26 DIAGNOSIS — E119 Type 2 diabetes mellitus without complications: Secondary | ICD-10-CM | POA: Diagnosis not present

## 2018-06-26 DIAGNOSIS — M810 Age-related osteoporosis without current pathological fracture: Secondary | ICD-10-CM | POA: Diagnosis not present

## 2018-06-26 DIAGNOSIS — I10 Essential (primary) hypertension: Secondary | ICD-10-CM | POA: Diagnosis not present

## 2018-06-26 DIAGNOSIS — E039 Hypothyroidism, unspecified: Secondary | ICD-10-CM | POA: Diagnosis not present

## 2018-06-26 DIAGNOSIS — Z48815 Encounter for surgical aftercare following surgery on the digestive system: Secondary | ICD-10-CM | POA: Diagnosis not present

## 2018-06-26 DIAGNOSIS — E7800 Pure hypercholesterolemia, unspecified: Secondary | ICD-10-CM | POA: Diagnosis not present

## 2018-06-26 DIAGNOSIS — R296 Repeated falls: Secondary | ICD-10-CM | POA: Diagnosis not present

## 2018-06-26 DIAGNOSIS — F0390 Unspecified dementia without behavioral disturbance: Secondary | ICD-10-CM | POA: Diagnosis not present

## 2018-06-26 DIAGNOSIS — M1611 Unilateral primary osteoarthritis, right hip: Secondary | ICD-10-CM | POA: Diagnosis not present

## 2018-06-27 DIAGNOSIS — I10 Essential (primary) hypertension: Secondary | ICD-10-CM | POA: Diagnosis not present

## 2018-06-27 DIAGNOSIS — M810 Age-related osteoporosis without current pathological fracture: Secondary | ICD-10-CM | POA: Diagnosis not present

## 2018-06-27 DIAGNOSIS — E7800 Pure hypercholesterolemia, unspecified: Secondary | ICD-10-CM | POA: Diagnosis not present

## 2018-06-27 DIAGNOSIS — M1611 Unilateral primary osteoarthritis, right hip: Secondary | ICD-10-CM | POA: Diagnosis not present

## 2018-06-27 DIAGNOSIS — E039 Hypothyroidism, unspecified: Secondary | ICD-10-CM | POA: Diagnosis not present

## 2018-06-27 DIAGNOSIS — E119 Type 2 diabetes mellitus without complications: Secondary | ICD-10-CM | POA: Diagnosis not present

## 2018-06-27 DIAGNOSIS — R296 Repeated falls: Secondary | ICD-10-CM | POA: Diagnosis not present

## 2018-06-27 DIAGNOSIS — Z48815 Encounter for surgical aftercare following surgery on the digestive system: Secondary | ICD-10-CM | POA: Diagnosis not present

## 2018-06-27 DIAGNOSIS — F0390 Unspecified dementia without behavioral disturbance: Secondary | ICD-10-CM | POA: Diagnosis not present

## 2018-06-29 DIAGNOSIS — E7800 Pure hypercholesterolemia, unspecified: Secondary | ICD-10-CM | POA: Diagnosis not present

## 2018-06-29 DIAGNOSIS — I10 Essential (primary) hypertension: Secondary | ICD-10-CM | POA: Diagnosis not present

## 2018-06-29 DIAGNOSIS — E039 Hypothyroidism, unspecified: Secondary | ICD-10-CM | POA: Diagnosis not present

## 2018-06-29 DIAGNOSIS — F0390 Unspecified dementia without behavioral disturbance: Secondary | ICD-10-CM | POA: Diagnosis not present

## 2018-06-29 DIAGNOSIS — Z48815 Encounter for surgical aftercare following surgery on the digestive system: Secondary | ICD-10-CM | POA: Diagnosis not present

## 2018-06-29 DIAGNOSIS — R296 Repeated falls: Secondary | ICD-10-CM | POA: Diagnosis not present

## 2018-06-29 DIAGNOSIS — M1611 Unilateral primary osteoarthritis, right hip: Secondary | ICD-10-CM | POA: Diagnosis not present

## 2018-06-29 DIAGNOSIS — E119 Type 2 diabetes mellitus without complications: Secondary | ICD-10-CM | POA: Diagnosis not present

## 2018-06-29 DIAGNOSIS — M810 Age-related osteoporosis without current pathological fracture: Secondary | ICD-10-CM | POA: Diagnosis not present

## 2018-07-02 DIAGNOSIS — M1611 Unilateral primary osteoarthritis, right hip: Secondary | ICD-10-CM | POA: Diagnosis not present

## 2018-07-02 DIAGNOSIS — E119 Type 2 diabetes mellitus without complications: Secondary | ICD-10-CM | POA: Diagnosis not present

## 2018-07-02 DIAGNOSIS — Z48815 Encounter for surgical aftercare following surgery on the digestive system: Secondary | ICD-10-CM | POA: Diagnosis not present

## 2018-07-02 DIAGNOSIS — E7800 Pure hypercholesterolemia, unspecified: Secondary | ICD-10-CM | POA: Diagnosis not present

## 2018-07-02 DIAGNOSIS — R296 Repeated falls: Secondary | ICD-10-CM | POA: Diagnosis not present

## 2018-07-02 DIAGNOSIS — E039 Hypothyroidism, unspecified: Secondary | ICD-10-CM | POA: Diagnosis not present

## 2018-07-02 DIAGNOSIS — F0390 Unspecified dementia without behavioral disturbance: Secondary | ICD-10-CM | POA: Diagnosis not present

## 2018-07-02 DIAGNOSIS — I10 Essential (primary) hypertension: Secondary | ICD-10-CM | POA: Diagnosis not present

## 2018-07-02 DIAGNOSIS — M810 Age-related osteoporosis without current pathological fracture: Secondary | ICD-10-CM | POA: Diagnosis not present

## 2018-07-03 DIAGNOSIS — Z48815 Encounter for surgical aftercare following surgery on the digestive system: Secondary | ICD-10-CM | POA: Diagnosis not present

## 2018-07-03 DIAGNOSIS — R296 Repeated falls: Secondary | ICD-10-CM | POA: Diagnosis not present

## 2018-07-03 DIAGNOSIS — M1611 Unilateral primary osteoarthritis, right hip: Secondary | ICD-10-CM | POA: Diagnosis not present

## 2018-07-03 DIAGNOSIS — E119 Type 2 diabetes mellitus without complications: Secondary | ICD-10-CM | POA: Diagnosis not present

## 2018-07-03 DIAGNOSIS — M810 Age-related osteoporosis without current pathological fracture: Secondary | ICD-10-CM | POA: Diagnosis not present

## 2018-07-03 DIAGNOSIS — I10 Essential (primary) hypertension: Secondary | ICD-10-CM | POA: Diagnosis not present

## 2018-07-03 DIAGNOSIS — E7800 Pure hypercholesterolemia, unspecified: Secondary | ICD-10-CM | POA: Diagnosis not present

## 2018-07-03 DIAGNOSIS — F0390 Unspecified dementia without behavioral disturbance: Secondary | ICD-10-CM | POA: Diagnosis not present

## 2018-07-03 DIAGNOSIS — E039 Hypothyroidism, unspecified: Secondary | ICD-10-CM | POA: Diagnosis not present

## 2018-07-05 DIAGNOSIS — Z48815 Encounter for surgical aftercare following surgery on the digestive system: Secondary | ICD-10-CM | POA: Diagnosis not present

## 2018-07-05 DIAGNOSIS — E039 Hypothyroidism, unspecified: Secondary | ICD-10-CM | POA: Diagnosis not present

## 2018-07-05 DIAGNOSIS — M810 Age-related osteoporosis without current pathological fracture: Secondary | ICD-10-CM | POA: Diagnosis not present

## 2018-07-05 DIAGNOSIS — E119 Type 2 diabetes mellitus without complications: Secondary | ICD-10-CM | POA: Diagnosis not present

## 2018-07-05 DIAGNOSIS — R296 Repeated falls: Secondary | ICD-10-CM | POA: Diagnosis not present

## 2018-07-05 DIAGNOSIS — I10 Essential (primary) hypertension: Secondary | ICD-10-CM | POA: Diagnosis not present

## 2018-07-05 DIAGNOSIS — F0390 Unspecified dementia without behavioral disturbance: Secondary | ICD-10-CM | POA: Diagnosis not present

## 2018-07-05 DIAGNOSIS — E7800 Pure hypercholesterolemia, unspecified: Secondary | ICD-10-CM | POA: Diagnosis not present

## 2018-07-05 DIAGNOSIS — M1611 Unilateral primary osteoarthritis, right hip: Secondary | ICD-10-CM | POA: Diagnosis not present

## 2018-07-10 DIAGNOSIS — M1611 Unilateral primary osteoarthritis, right hip: Secondary | ICD-10-CM | POA: Diagnosis not present

## 2018-07-10 DIAGNOSIS — R296 Repeated falls: Secondary | ICD-10-CM | POA: Diagnosis not present

## 2018-07-10 DIAGNOSIS — M810 Age-related osteoporosis without current pathological fracture: Secondary | ICD-10-CM | POA: Diagnosis not present

## 2018-07-10 DIAGNOSIS — I10 Essential (primary) hypertension: Secondary | ICD-10-CM | POA: Diagnosis not present

## 2018-07-10 DIAGNOSIS — Z48815 Encounter for surgical aftercare following surgery on the digestive system: Secondary | ICD-10-CM | POA: Diagnosis not present

## 2018-07-10 DIAGNOSIS — E7800 Pure hypercholesterolemia, unspecified: Secondary | ICD-10-CM | POA: Diagnosis not present

## 2018-07-10 DIAGNOSIS — F0390 Unspecified dementia without behavioral disturbance: Secondary | ICD-10-CM | POA: Diagnosis not present

## 2018-07-10 DIAGNOSIS — E119 Type 2 diabetes mellitus without complications: Secondary | ICD-10-CM | POA: Diagnosis not present

## 2018-07-10 DIAGNOSIS — E039 Hypothyroidism, unspecified: Secondary | ICD-10-CM | POA: Diagnosis not present

## 2018-07-12 DIAGNOSIS — R296 Repeated falls: Secondary | ICD-10-CM | POA: Diagnosis not present

## 2018-07-12 DIAGNOSIS — E039 Hypothyroidism, unspecified: Secondary | ICD-10-CM | POA: Diagnosis not present

## 2018-07-12 DIAGNOSIS — F0390 Unspecified dementia without behavioral disturbance: Secondary | ICD-10-CM | POA: Diagnosis not present

## 2018-07-12 DIAGNOSIS — I10 Essential (primary) hypertension: Secondary | ICD-10-CM | POA: Diagnosis not present

## 2018-07-12 DIAGNOSIS — M1611 Unilateral primary osteoarthritis, right hip: Secondary | ICD-10-CM | POA: Diagnosis not present

## 2018-07-12 DIAGNOSIS — E119 Type 2 diabetes mellitus without complications: Secondary | ICD-10-CM | POA: Diagnosis not present

## 2018-07-12 DIAGNOSIS — M810 Age-related osteoporosis without current pathological fracture: Secondary | ICD-10-CM | POA: Diagnosis not present

## 2018-07-12 DIAGNOSIS — Z48815 Encounter for surgical aftercare following surgery on the digestive system: Secondary | ICD-10-CM | POA: Diagnosis not present

## 2018-07-12 DIAGNOSIS — E7800 Pure hypercholesterolemia, unspecified: Secondary | ICD-10-CM | POA: Diagnosis not present

## 2018-07-17 DIAGNOSIS — F0390 Unspecified dementia without behavioral disturbance: Secondary | ICD-10-CM | POA: Diagnosis not present

## 2018-07-17 DIAGNOSIS — Z48815 Encounter for surgical aftercare following surgery on the digestive system: Secondary | ICD-10-CM | POA: Diagnosis not present

## 2018-07-17 DIAGNOSIS — E7800 Pure hypercholesterolemia, unspecified: Secondary | ICD-10-CM | POA: Diagnosis not present

## 2018-07-17 DIAGNOSIS — M810 Age-related osteoporosis without current pathological fracture: Secondary | ICD-10-CM | POA: Diagnosis not present

## 2018-07-17 DIAGNOSIS — E039 Hypothyroidism, unspecified: Secondary | ICD-10-CM | POA: Diagnosis not present

## 2018-07-17 DIAGNOSIS — I10 Essential (primary) hypertension: Secondary | ICD-10-CM | POA: Diagnosis not present

## 2018-07-17 DIAGNOSIS — R296 Repeated falls: Secondary | ICD-10-CM | POA: Diagnosis not present

## 2018-07-17 DIAGNOSIS — M1611 Unilateral primary osteoarthritis, right hip: Secondary | ICD-10-CM | POA: Diagnosis not present

## 2018-07-17 DIAGNOSIS — E119 Type 2 diabetes mellitus without complications: Secondary | ICD-10-CM | POA: Diagnosis not present

## 2018-07-19 DIAGNOSIS — M810 Age-related osteoporosis without current pathological fracture: Secondary | ICD-10-CM | POA: Diagnosis not present

## 2018-07-19 DIAGNOSIS — E039 Hypothyroidism, unspecified: Secondary | ICD-10-CM | POA: Diagnosis not present

## 2018-07-19 DIAGNOSIS — E119 Type 2 diabetes mellitus without complications: Secondary | ICD-10-CM | POA: Diagnosis not present

## 2018-07-19 DIAGNOSIS — Z48815 Encounter for surgical aftercare following surgery on the digestive system: Secondary | ICD-10-CM | POA: Diagnosis not present

## 2018-07-19 DIAGNOSIS — E7800 Pure hypercholesterolemia, unspecified: Secondary | ICD-10-CM | POA: Diagnosis not present

## 2018-07-19 DIAGNOSIS — I10 Essential (primary) hypertension: Secondary | ICD-10-CM | POA: Diagnosis not present

## 2018-07-19 DIAGNOSIS — M1611 Unilateral primary osteoarthritis, right hip: Secondary | ICD-10-CM | POA: Diagnosis not present

## 2018-07-19 DIAGNOSIS — R296 Repeated falls: Secondary | ICD-10-CM | POA: Diagnosis not present

## 2018-07-19 DIAGNOSIS — F0390 Unspecified dementia without behavioral disturbance: Secondary | ICD-10-CM | POA: Diagnosis not present

## 2018-07-24 DIAGNOSIS — E7800 Pure hypercholesterolemia, unspecified: Secondary | ICD-10-CM | POA: Diagnosis not present

## 2018-07-24 DIAGNOSIS — E119 Type 2 diabetes mellitus without complications: Secondary | ICD-10-CM | POA: Diagnosis not present

## 2018-07-24 DIAGNOSIS — R296 Repeated falls: Secondary | ICD-10-CM | POA: Diagnosis not present

## 2018-07-24 DIAGNOSIS — M1611 Unilateral primary osteoarthritis, right hip: Secondary | ICD-10-CM | POA: Diagnosis not present

## 2018-07-24 DIAGNOSIS — E039 Hypothyroidism, unspecified: Secondary | ICD-10-CM | POA: Diagnosis not present

## 2018-07-24 DIAGNOSIS — Z48815 Encounter for surgical aftercare following surgery on the digestive system: Secondary | ICD-10-CM | POA: Diagnosis not present

## 2018-07-24 DIAGNOSIS — I10 Essential (primary) hypertension: Secondary | ICD-10-CM | POA: Diagnosis not present

## 2018-07-24 DIAGNOSIS — M810 Age-related osteoporosis without current pathological fracture: Secondary | ICD-10-CM | POA: Diagnosis not present

## 2018-07-24 DIAGNOSIS — F0390 Unspecified dementia without behavioral disturbance: Secondary | ICD-10-CM | POA: Diagnosis not present

## 2018-07-31 DIAGNOSIS — E7800 Pure hypercholesterolemia, unspecified: Secondary | ICD-10-CM | POA: Diagnosis not present

## 2018-07-31 DIAGNOSIS — I10 Essential (primary) hypertension: Secondary | ICD-10-CM | POA: Diagnosis not present

## 2018-07-31 DIAGNOSIS — M1611 Unilateral primary osteoarthritis, right hip: Secondary | ICD-10-CM | POA: Diagnosis not present

## 2018-07-31 DIAGNOSIS — Z7984 Long term (current) use of oral hypoglycemic drugs: Secondary | ICD-10-CM | POA: Diagnosis not present

## 2018-07-31 DIAGNOSIS — R296 Repeated falls: Secondary | ICD-10-CM | POA: Diagnosis not present

## 2018-07-31 DIAGNOSIS — E119 Type 2 diabetes mellitus without complications: Secondary | ICD-10-CM | POA: Diagnosis not present

## 2018-07-31 DIAGNOSIS — F0390 Unspecified dementia without behavioral disturbance: Secondary | ICD-10-CM | POA: Diagnosis not present

## 2018-07-31 DIAGNOSIS — E039 Hypothyroidism, unspecified: Secondary | ICD-10-CM | POA: Diagnosis not present

## 2018-07-31 DIAGNOSIS — M810 Age-related osteoporosis without current pathological fracture: Secondary | ICD-10-CM | POA: Diagnosis not present

## 2018-08-03 DIAGNOSIS — I10 Essential (primary) hypertension: Secondary | ICD-10-CM | POA: Diagnosis not present

## 2018-08-03 DIAGNOSIS — E039 Hypothyroidism, unspecified: Secondary | ICD-10-CM | POA: Diagnosis not present

## 2018-08-03 DIAGNOSIS — M810 Age-related osteoporosis without current pathological fracture: Secondary | ICD-10-CM | POA: Diagnosis not present

## 2018-08-03 DIAGNOSIS — E7800 Pure hypercholesterolemia, unspecified: Secondary | ICD-10-CM | POA: Diagnosis not present

## 2018-08-03 DIAGNOSIS — R296 Repeated falls: Secondary | ICD-10-CM | POA: Diagnosis not present

## 2018-08-03 DIAGNOSIS — E119 Type 2 diabetes mellitus without complications: Secondary | ICD-10-CM | POA: Diagnosis not present

## 2018-08-03 DIAGNOSIS — F0390 Unspecified dementia without behavioral disturbance: Secondary | ICD-10-CM | POA: Diagnosis not present

## 2018-08-03 DIAGNOSIS — Z7984 Long term (current) use of oral hypoglycemic drugs: Secondary | ICD-10-CM | POA: Diagnosis not present

## 2018-08-03 DIAGNOSIS — M1611 Unilateral primary osteoarthritis, right hip: Secondary | ICD-10-CM | POA: Diagnosis not present

## 2018-08-07 DIAGNOSIS — E7800 Pure hypercholesterolemia, unspecified: Secondary | ICD-10-CM | POA: Diagnosis not present

## 2018-08-07 DIAGNOSIS — I10 Essential (primary) hypertension: Secondary | ICD-10-CM | POA: Diagnosis not present

## 2018-08-07 DIAGNOSIS — Z7984 Long term (current) use of oral hypoglycemic drugs: Secondary | ICD-10-CM | POA: Diagnosis not present

## 2018-08-07 DIAGNOSIS — E039 Hypothyroidism, unspecified: Secondary | ICD-10-CM | POA: Diagnosis not present

## 2018-08-07 DIAGNOSIS — R296 Repeated falls: Secondary | ICD-10-CM | POA: Diagnosis not present

## 2018-08-07 DIAGNOSIS — F0390 Unspecified dementia without behavioral disturbance: Secondary | ICD-10-CM | POA: Diagnosis not present

## 2018-08-07 DIAGNOSIS — M810 Age-related osteoporosis without current pathological fracture: Secondary | ICD-10-CM | POA: Diagnosis not present

## 2018-08-07 DIAGNOSIS — M1611 Unilateral primary osteoarthritis, right hip: Secondary | ICD-10-CM | POA: Diagnosis not present

## 2018-08-07 DIAGNOSIS — E119 Type 2 diabetes mellitus without complications: Secondary | ICD-10-CM | POA: Diagnosis not present

## 2018-08-09 DIAGNOSIS — E119 Type 2 diabetes mellitus without complications: Secondary | ICD-10-CM | POA: Diagnosis not present

## 2018-08-09 DIAGNOSIS — I10 Essential (primary) hypertension: Secondary | ICD-10-CM | POA: Diagnosis not present

## 2018-08-09 DIAGNOSIS — E039 Hypothyroidism, unspecified: Secondary | ICD-10-CM | POA: Diagnosis not present

## 2018-08-09 DIAGNOSIS — F0390 Unspecified dementia without behavioral disturbance: Secondary | ICD-10-CM | POA: Diagnosis not present

## 2018-08-09 DIAGNOSIS — Z7984 Long term (current) use of oral hypoglycemic drugs: Secondary | ICD-10-CM | POA: Diagnosis not present

## 2018-08-09 DIAGNOSIS — E7800 Pure hypercholesterolemia, unspecified: Secondary | ICD-10-CM | POA: Diagnosis not present

## 2018-08-09 DIAGNOSIS — M1611 Unilateral primary osteoarthritis, right hip: Secondary | ICD-10-CM | POA: Diagnosis not present

## 2018-08-09 DIAGNOSIS — M810 Age-related osteoporosis without current pathological fracture: Secondary | ICD-10-CM | POA: Diagnosis not present

## 2018-08-09 DIAGNOSIS — R296 Repeated falls: Secondary | ICD-10-CM | POA: Diagnosis not present

## 2018-08-15 DIAGNOSIS — E7800 Pure hypercholesterolemia, unspecified: Secondary | ICD-10-CM | POA: Diagnosis not present

## 2018-08-15 DIAGNOSIS — M810 Age-related osteoporosis without current pathological fracture: Secondary | ICD-10-CM | POA: Diagnosis not present

## 2018-08-15 DIAGNOSIS — Z7984 Long term (current) use of oral hypoglycemic drugs: Secondary | ICD-10-CM | POA: Diagnosis not present

## 2018-08-15 DIAGNOSIS — E119 Type 2 diabetes mellitus without complications: Secondary | ICD-10-CM | POA: Diagnosis not present

## 2018-08-15 DIAGNOSIS — F0390 Unspecified dementia without behavioral disturbance: Secondary | ICD-10-CM | POA: Diagnosis not present

## 2018-08-15 DIAGNOSIS — I10 Essential (primary) hypertension: Secondary | ICD-10-CM | POA: Diagnosis not present

## 2018-08-15 DIAGNOSIS — E039 Hypothyroidism, unspecified: Secondary | ICD-10-CM | POA: Diagnosis not present

## 2018-08-15 DIAGNOSIS — R296 Repeated falls: Secondary | ICD-10-CM | POA: Diagnosis not present

## 2018-08-15 DIAGNOSIS — M1611 Unilateral primary osteoarthritis, right hip: Secondary | ICD-10-CM | POA: Diagnosis not present

## 2018-08-17 DIAGNOSIS — I10 Essential (primary) hypertension: Secondary | ICD-10-CM | POA: Diagnosis not present

## 2018-08-17 DIAGNOSIS — R296 Repeated falls: Secondary | ICD-10-CM | POA: Diagnosis not present

## 2018-08-17 DIAGNOSIS — M1611 Unilateral primary osteoarthritis, right hip: Secondary | ICD-10-CM | POA: Diagnosis not present

## 2018-08-17 DIAGNOSIS — F0390 Unspecified dementia without behavioral disturbance: Secondary | ICD-10-CM | POA: Diagnosis not present

## 2018-08-17 DIAGNOSIS — M810 Age-related osteoporosis without current pathological fracture: Secondary | ICD-10-CM | POA: Diagnosis not present

## 2018-08-17 DIAGNOSIS — E039 Hypothyroidism, unspecified: Secondary | ICD-10-CM | POA: Diagnosis not present

## 2018-08-17 DIAGNOSIS — E119 Type 2 diabetes mellitus without complications: Secondary | ICD-10-CM | POA: Diagnosis not present

## 2018-08-17 DIAGNOSIS — Z7984 Long term (current) use of oral hypoglycemic drugs: Secondary | ICD-10-CM | POA: Diagnosis not present

## 2018-08-17 DIAGNOSIS — E7800 Pure hypercholesterolemia, unspecified: Secondary | ICD-10-CM | POA: Diagnosis not present

## 2018-09-17 DIAGNOSIS — M810 Age-related osteoporosis without current pathological fracture: Secondary | ICD-10-CM | POA: Diagnosis not present

## 2018-09-17 DIAGNOSIS — Z0000 Encounter for general adult medical examination without abnormal findings: Secondary | ICD-10-CM | POA: Diagnosis not present

## 2018-09-17 DIAGNOSIS — I10 Essential (primary) hypertension: Secondary | ICD-10-CM | POA: Diagnosis not present

## 2018-09-17 DIAGNOSIS — E7800 Pure hypercholesterolemia, unspecified: Secondary | ICD-10-CM | POA: Diagnosis not present

## 2018-09-17 DIAGNOSIS — E039 Hypothyroidism, unspecified: Secondary | ICD-10-CM | POA: Diagnosis not present

## 2018-09-17 DIAGNOSIS — E1169 Type 2 diabetes mellitus with other specified complication: Secondary | ICD-10-CM | POA: Diagnosis not present

## 2018-09-17 DIAGNOSIS — Z1389 Encounter for screening for other disorder: Secondary | ICD-10-CM | POA: Diagnosis not present

## 2018-09-19 DIAGNOSIS — E871 Hypo-osmolality and hyponatremia: Secondary | ICD-10-CM | POA: Diagnosis not present

## 2018-10-30 HISTORY — PX: LOOP RECORDER IMPLANT: SHX5954

## 2018-11-30 ENCOUNTER — Other Ambulatory Visit: Payer: Self-pay

## 2018-11-30 ENCOUNTER — Inpatient Hospital Stay (HOSPITAL_COMMUNITY)
Admission: EM | Admit: 2018-11-30 | Discharge: 2018-12-03 | DRG: 041 | Disposition: A | Discharge status: Home Health | Payer: Medicare HMO | Attending: Neurology | Admitting: Neurology

## 2018-11-30 ENCOUNTER — Emergency Department (HOSPITAL_COMMUNITY): Payer: Medicare HMO

## 2018-11-30 ENCOUNTER — Inpatient Hospital Stay (HOSPITAL_COMMUNITY): Payer: Medicare HMO

## 2018-11-30 DIAGNOSIS — G459 Transient cerebral ischemic attack, unspecified: Principal | ICD-10-CM | POA: Diagnosis present

## 2018-11-30 DIAGNOSIS — R4701 Aphasia: Secondary | ICD-10-CM | POA: Diagnosis not present

## 2018-11-30 DIAGNOSIS — Z833 Family history of diabetes mellitus: Secondary | ICD-10-CM | POA: Diagnosis not present

## 2018-11-30 DIAGNOSIS — Z79890 Hormone replacement therapy (postmenopausal): Secondary | ICD-10-CM | POA: Diagnosis not present

## 2018-11-30 DIAGNOSIS — R29705 NIHSS score 5: Secondary | ICD-10-CM | POA: Diagnosis present

## 2018-11-30 DIAGNOSIS — Z03818 Encounter for observation for suspected exposure to other biological agents ruled out: Secondary | ICD-10-CM | POA: Diagnosis not present

## 2018-11-30 DIAGNOSIS — Z23 Encounter for immunization: Secondary | ICD-10-CM | POA: Diagnosis not present

## 2018-11-30 DIAGNOSIS — I63412 Cerebral infarction due to embolism of left middle cerebral artery: Secondary | ICD-10-CM | POA: Diagnosis not present

## 2018-11-30 DIAGNOSIS — Z7984 Long term (current) use of oral hypoglycemic drugs: Secondary | ICD-10-CM | POA: Diagnosis not present

## 2018-11-30 DIAGNOSIS — I10 Essential (primary) hypertension: Secondary | ICD-10-CM | POA: Diagnosis present

## 2018-11-30 DIAGNOSIS — R9431 Abnormal electrocardiogram [ECG] [EKG]: Secondary | ICD-10-CM | POA: Diagnosis not present

## 2018-11-30 DIAGNOSIS — I639 Cerebral infarction, unspecified: Principal | ICD-10-CM

## 2018-11-30 DIAGNOSIS — Z7983 Long term (current) use of bisphosphonates: Secondary | ICD-10-CM | POA: Diagnosis not present

## 2018-11-30 DIAGNOSIS — R29818 Other symptoms and signs involving the nervous system: Secondary | ICD-10-CM | POA: Diagnosis not present

## 2018-11-30 DIAGNOSIS — R2990 Unspecified symptoms and signs involving the nervous system: Secondary | ICD-10-CM | POA: Diagnosis present

## 2018-11-30 DIAGNOSIS — Z87891 Personal history of nicotine dependence: Secondary | ICD-10-CM

## 2018-11-30 DIAGNOSIS — I351 Nonrheumatic aortic (valve) insufficiency: Secondary | ICD-10-CM | POA: Diagnosis not present

## 2018-11-30 DIAGNOSIS — E785 Hyperlipidemia, unspecified: Secondary | ICD-10-CM | POA: Diagnosis present

## 2018-11-30 DIAGNOSIS — E1159 Type 2 diabetes mellitus with other circulatory complications: Secondary | ICD-10-CM | POA: Diagnosis not present

## 2018-11-30 DIAGNOSIS — E039 Hypothyroidism, unspecified: Secondary | ICD-10-CM | POA: Diagnosis not present

## 2018-11-30 DIAGNOSIS — R4781 Slurred speech: Secondary | ICD-10-CM | POA: Diagnosis not present

## 2018-11-30 DIAGNOSIS — I161 Hypertensive emergency: Secondary | ICD-10-CM | POA: Diagnosis present

## 2018-11-30 DIAGNOSIS — E871 Hypo-osmolality and hyponatremia: Secondary | ICD-10-CM | POA: Diagnosis not present

## 2018-11-30 DIAGNOSIS — Z20828 Contact with and (suspected) exposure to other viral communicable diseases: Secondary | ICD-10-CM | POA: Diagnosis present

## 2018-11-30 DIAGNOSIS — E119 Type 2 diabetes mellitus without complications: Secondary | ICD-10-CM | POA: Diagnosis not present

## 2018-11-30 DIAGNOSIS — R29810 Facial weakness: Secondary | ICD-10-CM | POA: Diagnosis present

## 2018-11-30 DIAGNOSIS — G4489 Other headache syndrome: Secondary | ICD-10-CM | POA: Diagnosis not present

## 2018-11-30 LAB — BASIC METABOLIC PANEL
Anion gap: 13 (ref 5–15)
BUN: 10 mg/dL (ref 8–23)
CO2: 24 mmol/L (ref 22–32)
Calcium: 9.7 mg/dL (ref 8.9–10.3)
Chloride: 97 mmol/L — ABNORMAL LOW (ref 98–111)
Creatinine, Ser: 0.56 mg/dL (ref 0.44–1.00)
GFR calc Af Amer: >60 mL/min (ref >60)
GFR calc non Af Amer: >60 mL/min (ref >60)
Glucose, Bld: 142 mg/dL — ABNORMAL HIGH (ref 70–99)
Potassium: 4.3 mmol/L (ref 3.5–5.1)
Sodium: 134 mmol/L — ABNORMAL LOW (ref 135–145)

## 2018-11-30 LAB — PROTIME-INR
INR: 1.1 (ref 0.8–1.2)
Prothrombin Time: 13.9 seconds (ref 11.4–15.2)

## 2018-11-30 LAB — TROPONIN I (HIGH SENSITIVITY)
Troponin I (High Sensitivity): <2 ng/L (ref <18)
Troponin I (High Sensitivity): 4 ng/L (ref <18)

## 2018-11-30 LAB — I-STAT CHEM 8, ED
BUN: 11 mg/dL (ref 8–23)
Calcium, Ion: 1.16 mmol/L (ref 1.15–1.40)
Chloride: 97 mmol/L — ABNORMAL LOW (ref 98–111)
Creatinine, Ser: 0.5 mg/dL (ref 0.44–1.00)
Glucose, Bld: 138 mg/dL — ABNORMAL HIGH (ref 70–99)
HCT: 41 % (ref 36.0–46.0)
Hemoglobin: 13.9 g/dL (ref 12.0–15.0)
Potassium: 4.3 mmol/L (ref 3.5–5.1)
Sodium: 134 mmol/L — ABNORMAL LOW (ref 135–145)
TCO2: 25 mmol/L (ref 22–32)

## 2018-11-30 LAB — CBC WITH DIFFERENTIAL/PLATELET
Abs Immature Granulocytes: 0.06 10*3/uL (ref 0.00–0.07)
Basophils Absolute: 0 10*3/uL (ref 0.0–0.1)
Basophils Relative: 0 %
Eosinophils Absolute: 0 10*3/uL (ref 0.0–0.5)
Eosinophils Relative: 0 %
HCT: 36.7 % (ref 36.0–46.0)
Hemoglobin: 13.1 g/dL (ref 12.0–15.0)
Immature Granulocytes: 1 %
Lymphocytes Relative: 28 %
Lymphs Abs: 1.6 10*3/uL (ref 0.7–4.0)
MCH: 29.3 pg (ref 26.0–34.0)
MCHC: 35.7 g/dL (ref 30.0–36.0)
MCV: 82.1 fL (ref 80.0–100.0)
Monocytes Absolute: 0.7 10*3/uL (ref 0.1–1.0)
Monocytes Relative: 12 %
Neutro Abs: 3.2 10*3/uL (ref 1.7–7.7)
Neutrophils Relative %: 59 %
Platelets: 234 10*3/uL (ref 150–400)
RBC: 4.47 MIL/uL (ref 3.87–5.11)
RDW: 13.8 % (ref 11.5–15.5)
WBC: 5.6 10*3/uL (ref 4.0–10.5)
nRBC: 0 % (ref 0.0–0.2)

## 2018-11-30 LAB — GLUCOSE, CAPILLARY: Glucose-Capillary: 130 mg/dL — ABNORMAL HIGH (ref 70–99)

## 2018-11-30 LAB — SARS CORONAVIRUS 2 BY RT PCR (HOSPITAL ORDER, PERFORMED IN ~~LOC~~ HOSPITAL LAB): SARS Coronavirus 2: NEGATIVE

## 2018-11-30 LAB — MRSA PCR SCREENING: MRSA by PCR: NEGATIVE

## 2018-11-30 MED ORDER — PNEUMOCOCCAL VAC POLYVALENT 25 MCG/0.5ML IJ INJ
0.5000 mL | INJECTION | INTRAMUSCULAR | Status: AC
  Administered 2018-12-01: 10:00:00 0.5 mL via INTRAMUSCULAR
  Filled 2018-11-30: qty 0.5

## 2018-11-30 MED ORDER — INFLUENZA VAC A&B SA ADJ QUAD 0.5 ML IM PRSY
0.5000 mL | PREFILLED_SYRINGE | INTRAMUSCULAR | Status: AC
  Administered 2018-12-01: 0.5 mL via INTRAMUSCULAR
  Filled 2018-11-30: qty 0.5

## 2018-11-30 MED ORDER — ALTEPLASE (STROKE) FULL DOSE INFUSION
0.9000 mg/kg | Freq: Once | INTRAVENOUS | Status: AC
  Administered 2018-11-30: 14:00:00 54.3 mg via INTRAVENOUS
  Filled 2018-11-30: qty 100

## 2018-11-30 MED ORDER — ACETAMINOPHEN 650 MG RE SUPP: 650.0000 mg | RECTAL | Status: DC | PRN

## 2018-11-30 MED ORDER — IOHEXOL 350 MG/ML SOLN
75.0000 mL | Freq: Once | INTRAVENOUS | Status: AC | PRN
  Administered 2018-11-30: 75 mL via INTRAVENOUS

## 2018-11-30 MED ORDER — SODIUM CHLORIDE 0.9 % IV SOLN
50.0000 mL | Freq: Once | INTRAVENOUS | Status: AC
  Administered 2018-11-30: 50 mL via INTRAVENOUS

## 2018-11-30 MED ORDER — SODIUM CHLORIDE 0.9 % IV SOLN
50.0000 mL/h | INTRAVENOUS | Status: DC
  Administered 2018-11-30: 50 mL/h via INTRAVENOUS

## 2018-11-30 MED ORDER — PANTOPRAZOLE SODIUM 40 MG IV SOLR
40.0000 mg | Freq: Every day | INTRAVENOUS | Status: DC
  Administered 2018-11-30: 22:00:00 40 mg via INTRAVENOUS
  Filled 2018-11-30: qty 40

## 2018-11-30 MED ORDER — STROKE: EARLY STAGES OF RECOVERY BOOK
Freq: Once | Status: DC
  Filled 2018-11-30: qty 1

## 2018-11-30 MED ORDER — INSULIN ASPART 100 UNIT/ML ~~LOC~~ SOLN
0.0000 [IU] | Freq: Three times a day (TID) | SUBCUTANEOUS | Status: DC
  Administered 2018-12-01: 2 [IU] via SUBCUTANEOUS
  Administered 2018-12-01: 17:00:00 3 [IU] via SUBCUTANEOUS
  Administered 2018-12-02: 13:00:00 2 [IU] via SUBCUTANEOUS
  Administered 2018-12-03: 3 [IU] via SUBCUTANEOUS

## 2018-11-30 MED ORDER — ACETAMINOPHEN 325 MG PO TABS: 650.0000 mg | ORAL_TABLET | ORAL | Status: DC | PRN

## 2018-11-30 MED ORDER — CLEVIDIPINE BUTYRATE 0.5 MG/ML IV EMUL
0.0000 mg/h | INTRAVENOUS | Status: DC
  Administered 2018-11-30 (×2): 2 mg/h via INTRAVENOUS
  Filled 2018-11-30: qty 50

## 2018-11-30 MED ORDER — CHLORHEXIDINE GLUCONATE CLOTH 2 % EX PADS
6.0000 | MEDICATED_PAD | Freq: Every day | CUTANEOUS | Status: DC
  Administered 2018-12-01 – 2018-12-03 (×3): 6 via TOPICAL

## 2018-11-30 MED ORDER — LABETALOL HCL 5 MG/ML IV SOLN
20.0000 mg | Freq: Once | INTRAVENOUS | Status: AC
  Administered 2018-11-30: 14:00:00 20 mg via INTRAVENOUS

## 2018-11-30 MED ORDER — ACETAMINOPHEN 160 MG/5ML PO SOLN: 650.0000 mg | ORAL | Status: DC | PRN

## 2018-11-30 NOTE — H&P (Signed)
Neurology H&P  CC: Aphasia  History is obtained from: Patient, daughter  HPI: Areal Cochrane is a 85 y.o. female with history of diabetes, hypertension, hyperlipidemia who presents with aphasia that started abruptly this morning.  She was in her normal state of health and sitting with her daughter at 94.  She was on the phone with somebody and speaking normally.  When she got off the phone her daughter started talking to her and at that point she began garbling her words.  Due to this she was brought into the emergency room.  In route she cleared essentially completely and therefore she was initially considered TIA.  Subsequently however, she began worsening again and therefore code stroke was activated.  She was given IV TPA and taken for stat CT angiogram which was negative for LVO.  LKW: 11 AM tpa given?:  Yes IR Thrombectomy? No, no LVO Modified Rankin Scale: 3-Moderate disability-requires help but walks WITHOUT assistance NIHSS:    ROS:  Unable to obtain due to altered mental status.   Past Medical History:  Diagnosis Date  . Diabetes mellitus without complication ([place])   . Hyperlipemia   . Hypertension      Family History  Problem Relation Age of Onset  . Diabetes Mother   . Cancer Father      Social History:  reports that she has quit smoking. She has never used smokeless tobacco. She reports previous alcohol use. No history on file for drug.   Prior to Admission medications   Medication Sig Start Date End Date Taking? Authorizing Provider  acetaminophen (TYLENOL) 325 MG tablet Take 2 tablets (650 mg total) by mouth every 6 (six) hours as needed for mild pain (or Fever >/= 101). 05/28/18  Yes Regalado, Belkys A, MD  alendronate (FOSAMAX) 70 MG tablet Take 70 mg by mouth once a week. 08/06/18  Yes [provider]  cetirizine (ZYRTEC) 10 MG tablet Take 10 mg by mouth daily.   Yes [provider]  feeding supplement, ENSURE ENLIVE, (ENSURE ENLIVE) LIQD  Take 237 mLs by mouth 2 (two) times daily between meals. 05/28/18  Yes Regalado, Belkys A, MD  levothyroxine (SYNTHROID, LEVOTHROID) 88 MCG tablet Take 88 mcg by mouth every morning.   Yes [provider]  losartan (COZAAR) 50 MG tablet Take 50 mg by mouth daily.   Yes [provider]  metFORMIN (GLUCOPHAGE) 500 MG tablet Take 1,000 mg by mouth 2 (two) times daily.   Yes [provider]  Multiple Vitamin (MULTIVITAMIN WITH MINERALS) TABS tablet Take 1 tablet by mouth daily. 05/28/18  Yes Regalado, Belkys A, MD  polyethylene glycol (MIRALAX / GLYCOLAX) packet Take 17 g by mouth daily. 05/28/18  Yes Regalado, Belkys A, MD  pravastatin (PRAVACHOL) 40 MG tablet Take 40 mg by mouth daily.   Yes [provider]     Exam: Current vital signs: BP (!) 175/92   Pulse 83   Temp 98 F (36.7 C) (Oral)   Resp 17   Wt 60.3 kg   SpO2 95%   BMI 23.55 kg/m    Physical Exam  Constitutional: Appears well-developed and well-nourished.  Psych: Affect appropriate to situation Eyes: No scleral injection HENT: No OP obstrucion Head: Normocephalic.  Cardiovascular: Normal rate and regular rhythm.  Respiratory: Effort normal and breath sounds normal to anterior ascultation GI: Soft.  No distension. There is no tenderness.  Skin: WDI  Neuro: Mental Status: Patient is awake, alert, she is able to follow some commands,  but not reliably, able to occasionally name objects, but also not reliably. Cranial Nerves: II: Right hemianopia. Pupils are equal, round, and reactive to light.   III,IV, VI: EOMI without ptosis or diploplia.  V: Facial sensation is symmetric to temperature VII: Facial movement with right facial weakness VIII: hearing is intact to voice X: Uvula elevates symmetrically XI: Shoulder shrug is symmetric. XII: tongue is midline without atrophy or fasciculations.  Motor: Tone is normal. Bulk is normal. 5/5 strength was present in all four extremities.   Sensory: Sensation is symmetric to light touch and temperature in the arms and legs. Cerebellar: FNF and HKS are intact bilaterally  I have reviewed labs in epic and the pertinent results are: BMP-unremarkable  I have reviewed the images obtained: CT/CTA- negative  Primary Diagnosis:  Cerebral infarction due to embolism of  left middle cerebral artery.   Secondary Diagnosis: Hypertension Emergency (SBP > 180 or DBP > 120 & end organ damage) and Type 2 diabetes mellitus w/o complications   Impression: 85 year old female with acute onset right facial weakness and aphasia.  She is within the time window for IV TPA and this has been administered.  Plan: - HgbA1c, fasting lipid panel - MRI, MRA  of the brain without contrast - Frequent neuro checks - Echocardiogram - Carotid dopplers - Prophylactic therapy-Antiplatelet med: Aspirin - dose 325mg  PO or 300mg  PR - Risk factor modification - Telemetry monitoring - PT consult, OT consult, Speech consult - Stroke team to follow    This patient is critically ill and at significant risk of neurological worsening, death and care requires constant monitoring of vital signs, hemodynamics,respiratory and cardiac monitoring, neurological assessment, discussion with family, other specialists and medical decision making of high complexity. I spent 45 minutes of neurocritical care time  in the care of  this patient. This was time spent independent of any time provided by nurse practitioner or PA.  , MD [facility] [phone]  If 7pm- 7am, please page neurology on call as listed in AMION.

## 2018-11-30 NOTE — Progress Notes (Signed)
PHARMACIST CODE STROKE RESPONSE  Notified to mix tPA at 1402 by Dr. Leonel Ramsay Delivered tPA to RN at 1406  tPA dose = 5.4 mg bolus over 1 minute followed by 48.9 mg for a total dose of 54.3 mg over 1 hour  Issues/delays encountered (if applicable): N/A  Sherren Kerns, PharmD PGY1 Acute Care Pharmacy Resident 11/30/18 2:16 PM

## 2018-11-30 NOTE — ED Notes (Signed)
Dr Stark Jock ordered to call code stroke at 1344. Pt has already been to CT scan. Waiting for neurology

## 2018-11-30 NOTE — ED Provider Notes (Signed)
[facility] EMERGENCY DEPARTMENT Provider Note   CSN: [id number] Arrival date & time: 11/30/18  1250     History   Chief Complaint Chief Complaint  Patient presents with  . Cerebrovascular Accident    HPI Dana Saunders is a 85 y.o. female.     Patient is an 85 year old female with history of diabetes, hyperlipidemia, hypertension.  She was brought by EMS from home for evaluation of altered mental status.  According to EMS, the patient was at home when she developed difficulty speaking and disorientation.  There was said to be possibly so right-sided weakness and headache.  Patient's symptoms apparently improved in route.  While the patient was being triaged, she began again with difficulty speaking and seemed disoriented.  The patient denies to me she is experiencing any symptoms.  She reports a headache earlier, but this is gone.  The history is provided by the patient.  Cerebrovascular Accident This is a new problem. Episode onset: 11AM. The problem occurs constantly. The problem has not changed since onset.Pertinent negatives include no chest pain, no headaches and no shortness of breath. Nothing aggravates the symptoms. Nothing relieves the symptoms. She has tried nothing for the symptoms.    Past Medical History:  Diagnosis Date  . Diabetes mellitus without complication (HCC)   . Hyperlipemia   . Hypertension     Patient Active Problem List   Diagnosis Date Noted  . Malnutrition of moderate degree 05/22/2018  . Hypokalemia   . Hypomagnesemia   . Gastric volvulus 05/20/2018  . Hyponatremia 05/20/2018  . Elevated lipase 05/20/2018  . Diabetes mellitus type 2, noninsulin dependent (HCC) 05/20/2018  . Hyperlipidemia 05/20/2018  . Benign essential HTN 05/20/2018    Past Surgical History:  Procedure Laterality Date  . ESOPHAGOGASTRODUODENOSCOPY (EGD) WITH PROPOFOL N/A 05/20/2018   Procedure: ESOPHAGOGASTRODUODENOSCOPY (EGD) WITH PROPOFOL;   Surgeon: Kerin Salen, MD;  Location: [facility] ENDOSCOPY;  Service: Gastroenterology;  Laterality: N/A;  . GASTROSTOMY N/A 05/20/2018   Procedure: Insertion Of Gastrostomy Tube and Repair of Volvulus;  Surgeon: Violeta Gelinas, MD;  Location: [facility] OR;  Service: General;  Laterality: N/A;  . LAPAROTOMY N/A 05/20/2018   Procedure: EXPLORATORY LAPAROTOMY;  Surgeon: Violeta Gelinas, MD;  Location: [facility] OR;  Service: General;  Laterality: N/A;     OB History   No obstetric history on file.      Home Medications    Prior to Admission medications   Medication Sig Start Date End Date Taking? Authorizing Provider  acetaminophen (TYLENOL) 325 MG tablet Take 2 tablets (650 mg total) by mouth every 6 (six) hours as needed for mild pain (or Fever >/= 101). 05/28/18   Regalado, Belkys A, MD  amLODipine (NORVASC) 10 MG tablet Take 1 tablet (10 mg total) by mouth daily. 05/28/18   Regalado, Belkys A, MD  cetirizine (ZYRTEC) 10 MG tablet Take 10 mg by mouth daily.    [provider]  docusate sodium (COLACE) 100 MG capsule Take 1 capsule (100 mg total) by mouth 2 (two) times daily. 05/28/18   Regalado, Belkys A, MD  feeding supplement, ENSURE ENLIVE, (ENSURE ENLIVE) LIQD Take 237 mLs by mouth 2 (two) times daily between meals. 05/28/18   Regalado, Belkys A, MD  levothyroxine (SYNTHROID, LEVOTHROID) 88 MCG tablet Take 88 mcg by mouth every morning.    [provider]  metFORMIN (GLUCOPHAGE) 500 MG tablet Take 1,000 mg by mouth 2 (two) times daily.    [provider]  Multiple Vitamin (MULTIVITAMIN WITH  MINERALS) TABS tablet Take 1 tablet by mouth daily. 05/28/18   Regalado, Belkys A, MD  polyethylene glycol (MIRALAX / GLYCOLAX) packet Take 17 g by mouth daily. 05/28/18   Regalado, Belkys A, MD  pravastatin (PRAVACHOL) 40 MG tablet Take 40 mg by mouth daily.    [provider]  sitaGLIPtin (JANUVIA) 50 MG tablet Take 50 mg by mouth daily.    [provider]    Family History  Family History  Problem Relation Age of Onset  . Diabetes Mother   . Cancer Father     Social History Social History   Tobacco Use  . Smoking status: Former Games developermoker  . Smokeless tobacco: Never Used  Substance Use Topics  . Alcohol use: Not Currently    Frequency: Never  . Drug use: Not on file     Allergies   Patient has no known allergies.   Review of Systems Review of Systems  Respiratory: Negative for shortness of breath.   Cardiovascular: Negative for chest pain.  Neurological: Negative for headaches.  All other systems reviewed and are negative.    Physical Exam Updated Vital Signs BP (!) 187/111   Pulse 91   Resp 12   SpO2 96%   Physical Exam Vitals signs and nursing note reviewed.  Constitutional:      General: She is not in acute distress.    Appearance: She is well-developed. She is not diaphoretic.  HENT:     Head: Normocephalic and atraumatic.  Neck:     Musculoskeletal: Normal range of motion and neck supple.  Cardiovascular:     Rate and Rhythm: Normal rate and regular rhythm.     Heart sounds: No murmur. No friction rub. No gallop.   Pulmonary:     Effort: Pulmonary effort is normal. No respiratory distress.     Breath sounds: Normal breath sounds. No wheezing.  Abdominal:     General: Bowel sounds are normal. There is no distension.     Palpations: Abdomen is soft.     Tenderness: There is no abdominal tenderness.  Musculoskeletal: Normal range of motion.  Skin:    General: Skin is warm and dry.  Neurological:     General: No focal deficit present.     Mental Status: She is alert and oriented to person, place, and time.     Cranial Nerves: No cranial nerve deficit.     Sensory: No sensory deficit.     Motor: No weakness.     Coordination: Coordination normal.      ED Treatments / Results  Labs (all labs ordered are listed, but only abnormal results are displayed) Labs Reviewed  BASIC METABOLIC PANEL  PROTIME-INR  CBC WITH  DIFFERENTIAL/PLATELET  TROPONIN I (HIGH SENSITIVITY)    EKG EKG Interpretation  Date/Time:  Friday November 30 2018 13:09:30 EDT Ventricular Rate:  85 PR Interval:    QRS Duration: 94 QT Interval:  441 QTC Calculation: 525 R Axis:   11 Text Interpretation:  Sinus rhythm Abnormal R-wave progression, early transition Prolonged QT interval No significant change since 05/20/2018 Confirmed by Geoffery LyonseLo, Emrie Gayle ([id number]) on 11/30/2018 1:13:21 PM   Radiology No results found.  Procedures Procedures (including critical care time)  Medications Ordered in ED Medications - No data to display   Initial Impression / Assessment and Plan / ED Course  I have reviewed the triage vital signs and the nursing notes.  Pertinent labs & imaging results that were available during my care of the patient  were reviewed by me and considered in my medical decision making (see chart for details).  Patient is an 85 year old female brought by EMS for evaluation of slurred speech and confusion that began at approximately 11 AM.  By the time she arrived here, her symptoms had nearly resolved.  When I saw her her speech was clear and comprehensible.  Laboratory studies were obtained and the patient was sent for a head CT.  This was reported as unremarkable.    Shortly after returning from radiology, her symptoms returned.  She was reexamined and her speech was much more difficult to comprehend and slurred.  At that point, a code stroke was initiated and patient was evaluated by Dr. Leonel Ramsay.  In consultation with the patient and family, the decision was made to administer TPA.  Patient to be admitted to the neurology service for further evaluation.  CRITICAL CARE Performed by: Veryl Speak Total critical care time: 35 minutes Critical care time was exclusive of separately billable procedures and treating other patients. Critical care was necessary to treat or prevent imminent or life-threatening deterioration.  Critical care was time spent personally by me on the following activities: development of treatment plan with patient and/or surrogate as well as nursing, discussions with consultants, evaluation of patient's response to treatment, examination of patient, obtaining history from patient or surrogate, ordering and performing treatments and interventions, ordering and review of laboratory studies, ordering and review of radiographic studies, pulse oximetry and re-evaluation of patient's condition.   Final Clinical Impressions(s) / ED Diagnoses   Final diagnoses:  None    ED Discharge Orders    None       Veryl Speak, MD 11/30/18 1451

## 2018-11-30 NOTE — ED Triage Notes (Signed)
Per [facility], pt from home after having episode of jibberish/slurred speech at 1100 while speaking with a family member. Pt also had left sided headache and ems reports that both resolved at 1115. Pt ambulated with a walker and had weakness. Possible slight right sided facial droop noted. Upon assessment by this RN pt noted to have difficulty understanding certain directions and sounds to have some slurring in speech. Not answering all questions appropriately. Hypertensive and was same with ems. Dr Stark Jock assessed pt and he is going to speak with neurology. Ordered not to call a code stroke at this time.

## 2018-11-30 NOTE — Code Documentation (Signed)
85yo female arriving to [facility] via [place] at 1250. Patient was witnessed to have difficulty speaking at 1100. Patient presented to the ED where she was noted to have right hemianopsia, right facial droop and aphasia. Code stroke activated. Stroke team to the bedside. CT already completed. Patient with right hemianopsia, right facial droop, expressive aphasia and dysarthria on exam. Order to treat with tPA. Pharmacy at the bedside to mix tPA. Patient hypertensive requiring Labetalol 20mg  IVP and Cleviprex gtt, see documentation for vital signs and MAR for medication administration. BP within tPA parameters. 5.4mg  tPA bolus given over 1 minute at 1408 followed by 48.9mg /hr for a total of 54.3mg  per pharmacy dosing. Patient taken to CT for CTA head and neck. Patient tolerated well. Patient transported to the ED. Patient reassessed. Patient to be admitted to ICU when bed available. Bedside handoff with ED RN Lennette Bihari.

## 2018-11-30 NOTE — ED Notes (Signed)
Pt is being transported to CT.

## 2018-11-30 NOTE — ED Notes (Signed)
The pt's niece is now in room with pt. She states that now the pt has slurred speech and her face looks asymmetrical with drooping on the right. She spoke with the pt on the phone just prior to episode at 1100 and did not detect any abnormality at that time. Dr Stark Jock in room speaking with the pt's niece.

## 2018-12-01 ENCOUNTER — Inpatient Hospital Stay (HOSPITAL_COMMUNITY): Payer: Medicare HMO

## 2018-12-01 DIAGNOSIS — E785 Hyperlipidemia, unspecified: Secondary | ICD-10-CM

## 2018-12-01 DIAGNOSIS — I351 Nonrheumatic aortic (valve) insufficiency: Secondary | ICD-10-CM

## 2018-12-01 DIAGNOSIS — E1159 Type 2 diabetes mellitus with other circulatory complications: Secondary | ICD-10-CM

## 2018-12-01 DIAGNOSIS — I639 Cerebral infarction, unspecified: Secondary | ICD-10-CM

## 2018-12-01 DIAGNOSIS — I161 Hypertensive emergency: Secondary | ICD-10-CM

## 2018-12-01 DIAGNOSIS — I63412 Cerebral infarction due to embolism of left middle cerebral artery: Secondary | ICD-10-CM

## 2018-12-01 LAB — CBC
HCT: 35.1 % — ABNORMAL LOW (ref 36.0–46.0)
Hemoglobin: 12.5 g/dL (ref 12.0–15.0)
MCH: 29.1 pg (ref 26.0–34.0)
MCHC: 35.6 g/dL (ref 30.0–36.0)
MCV: 81.6 fL (ref 80.0–100.0)
Platelets: 233 10*3/uL (ref 150–400)
RBC: 4.3 MIL/uL (ref 3.87–5.11)
RDW: 13.8 % (ref 11.5–15.5)
WBC: 6.5 10*3/uL (ref 4.0–10.5)
nRBC: 0 % (ref 0.0–0.2)

## 2018-12-01 LAB — BASIC METABOLIC PANEL
Anion gap: 11 (ref 5–15)
BUN: 11 mg/dL (ref 8–23)
CO2: 26 mmol/L (ref 22–32)
Calcium: 9.1 mg/dL (ref 8.9–10.3)
Chloride: 99 mmol/L (ref 98–111)
Creatinine, Ser: 0.63 mg/dL (ref 0.44–1.00)
GFR calc Af Amer: >60 mL/min (ref >60)
GFR calc non Af Amer: >60 mL/min (ref >60)
Glucose, Bld: 103 mg/dL — ABNORMAL HIGH (ref 70–99)
Potassium: 4.2 mmol/L (ref 3.5–5.1)
Sodium: 136 mmol/L (ref 135–145)

## 2018-12-01 LAB — LIPID PANEL
Cholesterol: 166 mg/dL (ref 0–200)
HDL: 71 mg/dL (ref >40)
LDL Cholesterol: 86 mg/dL (ref 0–99)
Total CHOL/HDL Ratio: 2.3 RATIO
Triglycerides: 45 mg/dL (ref <150)
VLDL: 9 mg/dL (ref 0–40)

## 2018-12-01 LAB — GLUCOSE, CAPILLARY
Glucose-Capillary: 89 mg/dL (ref 70–99)
Glucose-Capillary: 121 mg/dL — ABNORMAL HIGH (ref 70–99)
Glucose-Capillary: 157 mg/dL — ABNORMAL HIGH (ref 70–99)
Glucose-Capillary: 125 mg/dL — ABNORMAL HIGH (ref 70–99)

## 2018-12-01 LAB — HEMOGLOBIN A1C
Hgb A1c MFr Bld: 5.5 % (ref 4.8–5.6)
Mean Plasma Glucose: 111.15 mg/dL

## 2018-12-01 LAB — ECHOCARDIOGRAM COMPLETE
Height: 63 in
Weight: 2031.76 oz

## 2018-12-01 MED ORDER — PRAVASTATIN SODIUM 40 MG PO TABS
40.0000 mg | ORAL_TABLET | Freq: Every day | ORAL | Status: DC
  Administered 2018-12-01 – 2018-12-03 (×3): 40 mg via ORAL
  Filled 2018-12-01 (×3): qty 1

## 2018-12-01 MED ORDER — HYDRALAZINE HCL 20 MG/ML IJ SOLN: 5.0000 mg | INTRAMUSCULAR | Status: DC | PRN

## 2018-12-01 MED ORDER — LEVOTHYROXINE SODIUM 88 MCG PO TABS
88.0000 ug | ORAL_TABLET | ORAL | Status: DC
  Administered 2018-12-02 – 2018-12-03 (×2): 88 ug via ORAL
  Filled 2018-12-01 (×2): qty 1

## 2018-12-01 MED ORDER — PANTOPRAZOLE SODIUM 40 MG PO TBEC
40.0000 mg | DELAYED_RELEASE_TABLET | Freq: Every day | ORAL | Status: DC
  Administered 2018-12-01 – 2018-12-03 (×3): 40 mg via ORAL
  Filled 2018-12-01 (×3): qty 1

## 2018-12-01 MED ORDER — CLOPIDOGREL BISULFATE 75 MG PO TABS
75.0000 mg | ORAL_TABLET | Freq: Every day | ORAL | Status: DC
  Administered 2018-12-01 – 2018-12-03 (×3): 75 mg via ORAL
  Filled 2018-12-01 (×3): qty 1

## 2018-12-01 MED ORDER — ASPIRIN EC 81 MG PO TBEC
81.0000 mg | DELAYED_RELEASE_TABLET | Freq: Every day | ORAL | Status: DC
  Administered 2018-12-01 – 2018-12-03 (×3): 81 mg via ORAL
  Filled 2018-12-01 (×3): qty 1

## 2018-12-01 NOTE — Evaluation (Signed)
Speech Language Pathology Evaluation Patient Details Name: Dana Saunders MRN: [id number] DOB: [birth year] Today's Date: 12/01/2018 Time: 1325-1350 SLP Time Calculation (min) (ACUTE ONLY): 25 min  Problem List:  Patient Active Problem List   Diagnosis Date Noted  . Stroke (cerebrum) (HCC) 11/30/2018  . Malnutrition of moderate degree 05/22/2018  . Hypokalemia   . Hypomagnesemia   . Gastric volvulus 05/20/2018  . Hyponatremia 05/20/2018  . Elevated lipase 05/20/2018  . Diabetes mellitus type 2, noninsulin dependent (HCC) 05/20/2018  . Hyperlipidemia 05/20/2018  . Benign essential HTN 05/20/2018   Past Medical History:  Past Medical History:  Diagnosis Date  . Diabetes mellitus without complication (HCC)   . Hyperlipemia   . Hypertension    Past Surgical History:  Past Surgical History:  Procedure Laterality Date  . ESOPHAGOGASTRODUODENOSCOPY (EGD) WITH PROPOFOL N/A 05/20/2018   Procedure: ESOPHAGOGASTRODUODENOSCOPY (EGD) WITH PROPOFOL;  Surgeon: Kerin Salen, MD;  Location: [facility] ENDOSCOPY;  Service: Gastroenterology;  Laterality: N/A;  . GASTROSTOMY N/A 05/20/2018   Procedure: Insertion Of Gastrostomy Tube and Repair of Volvulus;  Surgeon: Violeta Gelinas, MD;  Location: [facility] OR;  Service: General;  Laterality: N/A;  . LAPAROTOMY N/A 05/20/2018   Procedure: EXPLORATORY LAPAROTOMY;  Surgeon: Violeta Gelinas, MD;  Location: [facility] OR;  Service: General;  Laterality: N/A;   HPI:  85 yo admitted with aphasia with initial improvement and then worsening s/p tPA await MRI. PMH: HTN, DM, HLD   Assessment / Plan / Recommendation Clinical Impression  Pt presents with mild receptive and expressive language deficits. She has difficulty with abstract/complex yes/no questions. She is able to follow one and two step verbal directions, but has difficulty with 3+/complex verbal directions, requiring repetition for accuracy. Expressively, pt is able to complete automatic sequences, repeat sentence  length mateial, and complete responsive and confrontational naming tasks. Difficult arises with higher level and abstract word finding tasks, such as defining homonyms, describing without naming, and naming category members. Pt exhibits intermittent paraphasic errors (stated she was waiting for her "Ames Dura" instead of MRI - immediate awareness of error). Continued ST intervention is recommended at next venue to maximize functional and effective communication. Will also follow acutely.    SLP Assessment  SLP Recommendation/Assessment: Patient needs continued Speech Language Pathology Services SLP Visit Diagnosis: Aphasia (R47.01)    Follow Up Recommendations  Home health SLP;Outpatient SLP   Frequency and Duration min 1 x/week  2 weeks      SLP Evaluation Cognition  Overall Cognitive Status: No family/caregiver present to determine baseline cognitive functioning Arousal/Alertness: Awake/alert Orientation Level: Oriented to person;Oriented to place;Disoriented to time;Disoriented to situation       Comprehension  Auditory Comprehension Overall Auditory Comprehension: Impaired Yes/No Questions: Impaired Basic Biographical Questions: 76-100% accurate Basic Immediate Environment Questions: 75-100% accurate Complex Questions: 50-74% accurate Commands: Impaired One Step Basic Commands: 75-100% accurate Two Step Basic Commands: 75-100% accurate Multistep Basic Commands: 50-74% accurate Conversation: Simple    Expression Expression Primary Mode of Expression: Verbal Verbal Expression Overall Verbal Expression: Impaired Initiation: No impairment Automatic Speech: Social Response;Name;Counting;Day of week;Month of year Level of Generative/Spontaneous Verbalization: Sentence Repetition: No impairment Naming: Impairment Responsive: 76-100% accurate Confrontation: Within functional limits Convergent: 50-74% accurate Divergent: 50-74% accurate Other Naming Comments: high level,  abstract and open ended tasks are more difficult (homonyms, verbal fluency) Verbal Errors: Aware of errors Pragmatics: No impairment Written Expression Dominant Hand: Right   Oral / Motor  Oral Motor/Sensory Function Overall Oral Motor/Sensory Function: Within functional limits Motor Speech Overall Motor  Speech: Appears within functional limits for tasks assessed Respiration: Within functional limits Phonation: Normal Resonance: Within functional limits Articulation: Within functional limitis Intelligibility: Intelligible Motor Planning: Witnin functional limits   GO                   Celia B. Quentin Ore, [facility], [place] Speech Language Pathologist Office: [phone] Pager: [phone]  Shonna Chock 12/01/2018, 1:52 PM

## 2018-12-01 NOTE — Progress Notes (Signed)
PT Cancellation Note  Patient Details Name: Helene Bernstein MRN: [id number] DOB: 1930/06/24   Cancelled Treatment:    Reason Eval/Treat Not Completed: Active bedrest order   Sandy Salaam Nikesha Kwasny 12/01/2018, 6:59 AM  Elwyn Reach, PT Acute Rehabilitation Services Pager: [phone] Office: [phone]

## 2018-12-01 NOTE — Evaluation (Signed)
Occupational Therapy Evaluation Patient Details Name: Dana Saunders MRN: [id number] DOB: [birth year] Today's Date: 12/01/2018    History of Present Illness 85 yo admitted with aphasia with initial improvement and then worsening s/p tPA await MRI. PMhx: HTN, DM, HLD   Clinical Impression   Pt admitted with above diagnoses, with cognitive deficits and generalized weakness with decreased activity limiting ability to engage in BADL at desired level of ind. PTA pt living with dtr, who works from home currently. Niece also comes in to check on/sit with pt. Suspect cognitive deficits at baseline, but pt unable to draw clock on assessment due to decreased executive functioning. Pt also with difficulty with memory. No R hemianopsia noted, although was mentioned in chart at one point. Functional mobility to BR completed at min guard assist with RW. Pt able to stand at sink to complete grooming tasks with min guard assist. Given current level of function, recommend HHOT at d/c for safe transition into home environment. Pt will benefit from acute OT Per POC listed below.     Follow Up Recommendations  Home health OT;Supervision/Assistance - 24 hour    Equipment Recommendations  None recommended by OT    Recommendations for Other Services       Precautions / Restrictions Precautions Precautions: Fall Precaution Comments: sBP <180 Restrictions Weight Bearing Restrictions: No      Mobility Bed Mobility Overal bed mobility: Modified Independent             General bed mobility comments: up in chair  Transfers Overall transfer level: Needs assistance Equipment used: Rolling walker (2 wheeled) Transfers: Sit to/from Stand Sit to Stand: Min guard         General transfer comment: min guard for safety    Balance Overall balance assessment: Needs assistance Sitting-balance support: No upper extremity supported;Feet supported Sitting balance-Leahy Scale: Good     Standing  balance support: Single extremity supported;During functional activity;Bilateral upper extremity supported Standing balance-Leahy Scale: Poor Standing balance comment: reliant on external support                           ADL either performed or assessed with clinical judgement   ADL Overall ADL's : Needs assistance/impaired Eating/Feeding: Set up;Sitting   Grooming: Supervision/safety;Standing;Oral care   Upper Body Bathing: Set up;Sitting   Lower Body Bathing: Minimal assistance;Sit to/from stand;Sitting/lateral leans   Upper Body Dressing : Set up;Sitting   Lower Body Dressing: Minimal assistance;Sit to/from stand;Sitting/lateral leans   Toilet Transfer: Min guard;Regular Toilet;RW;Ambulation   Toileting- Clothing Manipulation and Hygiene: Set up;Sitting/lateral lean;Sit to/from stand   Tub/ Shower Transfer: Min guard;Shower seat;Ambulation;Rolling walker   Functional mobility during ADLs: Min guard;Rolling walker General ADL Comments: pt ltd by cognitive deficits and generalized weaknes     Vision Baseline Vision/History: No visual deficits Patient Visual Report: No change from baseline Vision Assessment?: Yes Eye Alignment: Within Functional Limits Ocular Range of Motion: Within Functional Limits Alignment/Gaze Preference: Within Defined Limits Tracking/Visual Pursuits: Able to track stimulus in all quads without difficulty Saccades: Within functional limits Convergence: Within functional limits Visual Fields: No apparent deficits Additional Comments: per chart review, R hemanopsia noted. Not noted at time of eval with letter cancellation task     Perception     Praxis      Pertinent Vitals/Pain Pain Assessment: No/denies pain     Hand Dominance     Extremity/Trunk Assessment Upper Extremity Assessment Upper Extremity Assessment: Generalized weakness  Lower Extremity Assessment Lower Extremity Assessment: Generalized weakness   Cervical /  Trunk Assessment Cervical / Trunk Assessment: Kyphotic   Communication Communication Communication: Expressive difficulties   Cognition Arousal/Alertness: Awake/alert Behavior During Therapy: WFL for tasks assessed/performed Overall Cognitive Status: Impaired/Different from baseline Area of Impairment: Memory;Following commands;Safety/judgement;Problem solving;Orientation                 Orientation Level: Time;Situation;Disoriented to   Memory: Decreased short-term memory Following Commands: Follows one step commands consistently Safety/Judgement: Decreased awareness of safety   Problem Solving: Slow processing General Comments: slow processing, unaware of current situation and time. Unable to draw clock even with cues and example   General Comments       Exercises     Shoulder Instructions      Home Living Family/patient expects to be discharged to:: Private residence Living Arrangements: Children Available Help at Discharge: Family;Available 24 hours/day Type of Home: Apartment Home Access: Level entry     Home Layout: One level     Bathroom Shower/Tub: Chief Strategy Officer: Standard     Home Equipment: Environmental consultant - 2 wheels;Cane - single point;Bedside commode          Prior Functioning/Environment Level of Independence: Needs assistance  Gait / Transfers Assistance Needed: pt states she walks with walker or cane ADL's / Homemaking Assistance Needed: pt reports she does her own bathing/dressing and simple meal prep but daughter drives, cooks, cleans   Comments: pt says daughter works from home as a Runner, broadcasting/film/video with niece also staying with her for the day to attend to needs and safety        OT Problem List: Decreased strength;Decreased knowledge of use of DME or AE;Decreased activity tolerance;Decreased cognition;Impaired balance (sitting and/or standing);Decreased safety awareness      OT Treatment/Interventions: Self-care/ADL  training;Therapeutic exercise;Patient/family education;Neuromuscular education;Balance training;Energy conservation;Therapeutic activities;DME and/or AE instruction;Cognitive remediation/compensation    OT Goals(Current goals can be found in the care plan section) Acute Rehab OT Goals Patient Stated Goal: return home OT Goal Formulation: With patient Time For Goal Achievement: 12/15/18 Potential to Achieve Goals: Good  OT Frequency: Min 2X/week   Barriers to D/C:            Co-evaluation              AM-PAC OT "6 Clicks" Daily Activity     Outcome Measure Help from another person eating meals?: None Help from another person taking care of personal grooming?: None Help from another person toileting, which includes using toliet, bedpan, or urinal?: A Little Help from another person bathing (including washing, rinsing, drying)?: A Little Help from another person to put on and taking off regular upper body clothing?: None Help from another person to put on and taking off regular lower body clothing?: A Little 6 Click Score: 21   End of Session Equipment Utilized During Treatment: Gait belt;Rolling walker Nurse Communication: Mobility status  Activity Tolerance: Patient tolerated treatment well Patient left: in chair;with call bell/phone within reach;with chair alarm set  OT Visit Diagnosis: Unsteadiness on feet (R26.81);Other abnormalities of gait and mobility (R26.89);Muscle weakness (generalized) (M62.81);Other symptoms and signs involving cognitive function                Time: 4315-4008 OT Time Calculation (min): 23 min Charges:  OT General Charges $OT Visit: 1 Visit OT Evaluation $OT Eval Moderate Complexity: 1 Mod OT Treatments $Self Care/Home Management : 8-22 mins  Dana Handing, MSOT, OTR/L [employer] OT/  Acute Relief OT [facility] Office: [phone]   Dana Saunders 12/01/2018, 1:21 PM

## 2018-12-01 NOTE — Progress Notes (Signed)
STROKE TEAM PROGRESS NOTE   INTERVAL HISTORY Her RN is at the bedside.  Pt sitting in bed for breakfast, speech much improved, seems at baseline. Pt stated that she had slurry speech yesterday but able to talk. She complains not slept well last night. MRI pending. Off cleviprex.   OBJECTIVE Vitals:   12/01/18 0630 12/01/18 0700 12/01/18 0730 12/01/18 0800  BP: (!) 159/84 (!) 142/54 (!) 138/58 (!) 149/58  Pulse: 61 (!) 54 (!) 53 (!) 51  Resp: 20 14 15 14   Temp:    98.5 F (36.9 C)  TempSrc:    Oral  SpO2: 98% 94% 94% 96%  Weight:      Height:        CBC:  Recent Labs  Lab 11/30/18 1309 11/30/18 1325 12/01/18 0734  WBC 5.6  --  6.5  NEUTROABS 3.2  --   --   HGB 13.1 13.9 12.5  HCT 36.7 41.0 35.1*  MCV 82.1  --  81.6  PLT 234  --  025    Basic Metabolic Panel:  Recent Labs  Lab 11/30/18 1309 11/30/18 1325  NA 134* 134*  K 4.3 4.3  CL 97* 97*  CO2 24  --   GLUCOSE 142* 138*  BUN 10 11  CREATININE 0.56 0.50  CALCIUM 9.7  --     Lipid Panel: No results found for: CHOL, TRIG, HDL, CHOLHDL, VLDL, LDLCALC HgbA1c: No results found for: HGBA1C Urine Drug Screen: No results found for: LABOPIA, COCAINSCRNUR, LABBENZ, AMPHETMU, THCU, LABBARB  Alcohol Level No results found for: ETH  IMAGING  Ct Angio Head W Or Wo Contrast Ct Angio Neck W Or Wo Contrast 11/30/2018 IMPRESSION:  No significant carotid or vertebral artery stenosis in the neck No significant cranial stenosis. Negative for large vessel occlusion.    Ct Head Wo Contrast 11/30/2018 IMPRESSION:  Chronic atrophic and ischemic changes without acute abnormality.   MRI Brain Wo Contrast - pending   Transthoracic Echocardiogram  Pending   Bilateral Lower Extremity Venous Dopplers Pending   ECG - SR rate 85  BPM. (See cardiology reading for complete details)   PHYSICAL EXAM  Temp:  [98 F (36.7 C)-98.7 F (37.1 C)] 98.5 F (36.9 C) (10/03 0800) Pulse Rate:  [51-106] 67 (10/03 0900) Resp:   [12-41] 15 (10/03 0900) BP: (102-218)/(54-111) 134/77 (10/03 0900) SpO2:  [91 %-99 %] 94 % (10/03 0900) Weight:  [57.6 kg-60.3 kg] 57.6 kg (10/02 1700)  General - Well nourished, well developed, in no apparent distress.  Ophthalmologic - fundi not visualized due to noncooperation.  Cardiovascular - Regular rhythm and rate.  Mental Status -  Level of arousal and orientation to time, place, and person were intact, but not orientated to her age. Language including expression, naming, repetition, comprehension was assessed and found intact. Slight dysarthria (could be due to no denture)  Cranial Nerves II - XII - II - Visual field intact OU. III, IV, VI - Extraocular movements intact. V - Facial sensation intact bilaterally. VII - left nasolabial fold flattening. VIII - Hearing & vestibular intact bilaterally. X - Palate elevates symmetrically. XI - Chin turning & shoulder shrug intact bilaterally. XII - Tongue protrusion intact.  Motor Strength - The patient's strength was normal in all extremities and pronator drift was absent.  Bulk was normal and fasciculations were absent.   Motor Tone - Muscle tone was assessed at the neck and appendages and was normal.  Reflexes - The patient's reflexes were symmetrical in all  extremities and she had no pathological reflexes.  Sensory - Light touch, temperature/pinprick were assessed and were symmetrical.    Coordination - The patient had normal movements in the hands with no ataxia or dysmetria.  Tremor was absent.  Gait and Station - deferred.    ASSESSMENT/PLAN [name] is a 85 y.o. female with history of diabetes, hypothyroidism, hypertension, hyperlipidemia presenting with aphasia. She received IV t-PA on Friday 11/30/2018 At 1415.  Stroke: left cortical stroke ??, MRI - pending  Resultant mild left facial droop  Code Stroke CT Head - not ordered  CT Head - Chronic atrophic and ischemic changes without acute  abnormality.   MRI head - pending  CTA H&N - No significant carotid or vertebral artery stenosis in the neck No significant cranial stenosis. Negative for large vessel occlusion.   LE venous doppler pending  2D Echo - pending  Consider loop recorder if MRI shows embolic stroke  LDL - 86  HgbA1c - 5.5  VTE prophylaxis - SCDs  No antithrombotic prior to admission, now on No antithrombotic - within 24h post tPA  Patient will be counseled to be compliant with her antithrombotic medications  Ongoing aggressive stroke risk factor management  Therapy recommendations:  pending  Disposition:  Pending  Hypertension  Home BP meds: Cozaar 50 mg daily  Was on Cleviprex, now off  Stable . Permissive hypertension (OK if < 180/105) but gradually normalize in 5-7 days. . Long-term BP goal normotensive  Hyperlipidemia  Home Lipid lowering medication: Pravachol 40 mg daily  LDL 86, goal < 70  Current lipid lowering medication:  Resume pravastatin 40   Continue statin at discharge  Diabetes  Home diabetic meds: Glucophage  HgbA1c 5.5, goal < 7.0  Controlled  SSI  CBG monitoring  Other Stroke Risk Factors  Advanced age  Former cigarette smoker - quit  Previous ETOH use  Other Active Problems  Na - 134  Hospital day # 1  This patient is critically ill due to stroke s/p tPA and hypertensive emergency and at significant risk of neurological worsening, death form recurrent stroke, hemorrhagic conversion, heart failure, hypertensive encephalopathy. This patient's care requires constant monitoring of vital signs, hemodynamics, respiratory and cardiac monitoring, review of multiple databases, neurological assessment, discussion with family, other specialists and medical decision making of high complexity. I spent 35 minutes of neurocritical care time in the care of this patient.  Marvel Plan, MD PhD Stroke Neurology 12/01/2018 10:25 AM   To contact Stroke  Continuity provider, please refer to [email or web address]. After hours, contact General Neurology

## 2018-12-01 NOTE — Progress Notes (Signed)
*  PRELIMINARY RESULTS* Echocardiogram 2D Echocardiogram has been performed.  Leavy Cella 12/01/2018, 1:15 PM

## 2018-12-01 NOTE — Plan of Care (Signed)
Pt able to participate in self-care

## 2018-12-01 NOTE — Evaluation (Signed)
Physical Therapy Evaluation Patient Details Name: Dana Saunders MRN: [id number] DOB: 1930/10/25 Today's Date: 12/01/2018   History of Present Illness  85 yo admitted with aphasia with initial improvement and then worsening s/p tPA await MRI. PMhx: HTN, DM, HLD  Clinical Impression  Pt very pleasant and very eager to get OOb and toilet. Pt with mild expressive deficits at times and decreased problem solving and orientation without family present to determine baseline. Pt able to don socks and shoes EOB and perform pericare after toileting. Pt with decreased balance and cognition who will benefit from acute therapy to maximize mobility and safety to return home with family. Per pt sounds like she has 24hr supervision between daughter and niece and would recommend for safety to maintain supervision during the day. Pt educated for CvA signs/symptoms (BE fAST) with limited ability to recall. Recommend daily mobility with nursing staff.   Pre BP 134/74, HR 58 Post BP 150/72, HR 67    Follow Up Recommendations Home health PT(pt was active with HHPT PTA)    Equipment Recommendations  None recommended by PT    Recommendations for Other Services       Precautions / Restrictions Precautions Precautions: Fall Precaution Comments: sBP <180      Mobility  Bed Mobility Overal bed mobility: Modified Independent                Transfers Overall transfer level: Needs assistance   Transfers: Sit to/from Stand Sit to Stand: Min guard         General transfer comment: guarding for safety with pt able to stand from bed as well as toilet  Ambulation/Gait Ambulation/Gait assistance: Min guard Gait Distance (Feet): 150 Feet Assistive device: Rolling walker (2 wheeled) Gait Pattern/deviations: Step-through pattern;Decreased stride length;Trunk flexed   Gait velocity interpretation: >2.62 ft/sec, indicative of community ambulatory General Gait Details: cues for posture and proximity  to RW with pt initially very posterior to RW but improved with cues and increased distance  Financial trader Rankin (Stroke Patients Only) Modified Rankin (Stroke Patients Only) Pre-Morbid Rankin Score: Moderate disability Modified Rankin: Moderate disability     Balance Overall balance assessment: Needs assistance Sitting-balance support: No upper extremity supported;Feet supported Sitting balance-Leahy Scale: Good     Standing balance support: Single extremity supported Standing balance-Leahy Scale: Poor Standing balance comment: single UE support at sink, bil UE with gait                             Pertinent Vitals/Pain Pain Assessment: No/denies pain    Home Living Family/patient expects to be discharged to:: Private residence Living Arrangements: Children Available Help at Discharge: Family;Available 24 hours/day Type of Home: Apartment Home Access: Level entry     Home Layout: One level Home Equipment: Walker - 2 wheels;Cane - single point;Bedside commode      Prior Function Level of Independence: Needs assistance   Gait / Transfers Assistance Needed: pt states she walks with walker or cane  ADL's / Homemaking Assistance Needed: pt reports she does her own bathing/dressing and simple meal prep but daughter drives, cooks, cleans  Comments: pt says daughter works from home as a Pharmacist, hospital with niece also staying with her for the day to attend to needs and safety     Hand Dominance        Extremity/Trunk Assessment  Upper Extremity Assessment Upper Extremity Assessment: Generalized weakness    Lower Extremity Assessment Lower Extremity Assessment: Generalized weakness    Cervical / Trunk Assessment Cervical / Trunk Assessment: Kyphotic  Communication   Communication: Expressive difficulties(mild expressive aphasia at times)  Cognition Arousal/Alertness: Awake/alert Behavior During Therapy: WFL for  tasks assessed/performed Overall Cognitive Status: Impaired/Different from baseline Area of Impairment: Memory;Following commands;Safety/judgement;Problem solving;Orientation                 Orientation Level: Time;Situation;Disoriented to   Memory: Decreased short-term memory Following Commands: Follows one step commands consistently Safety/Judgement: Decreased awareness of safety   Problem Solving: Slow processing General Comments: pt with slow processing, aware of need to urinate but initially unaware of incontinence on the way to the toilet. Pt  unaware of what brought her to the hospital. Day off with orientation      General Comments      Exercises     Assessment/Plan    PT Assessment Patient needs continued PT services  PT Problem List Decreased activity tolerance;Decreased balance;Decreased knowledge of use of DME;Decreased cognition;Decreased mobility;Decreased safety awareness       PT Treatment Interventions Gait training;Therapeutic exercise;Patient/family education;Functional mobility training;Balance training;Therapeutic activities;DME instruction;Cognitive remediation    PT Goals (Current goals can be found in the Care Plan section)  Acute Rehab PT Goals Patient Stated Goal: return home PT Goal Formulation: With patient Time For Goal Achievement: 12/15/18 Potential to Achieve Goals: Good    Frequency Min 3X/week   Barriers to discharge        Co-evaluation               AM-PAC PT "6 Clicks" Mobility  Outcome Measure Help needed turning from your back to your side while in a flat bed without using bedrails?: None Help needed moving from lying on your back to sitting on the side of a flat bed without using bedrails?: None Help needed moving to and from a bed to a chair (including a wheelchair)?: A Little Help needed standing up from a chair using your arms (e.g., wheelchair or bedside chair)?: A Little Help needed to walk in hospital room?:  A Little Help needed climbing 3-5 steps with a railing? : A Little 6 Click Score: 20    End of Session Equipment Utilized During Treatment: Gait belt Activity Tolerance: Patient tolerated treatment well Patient left: in chair;with call bell/phone within reach;with chair alarm set Nurse Communication: Mobility status PT Visit Diagnosis: Other abnormalities of gait and mobility (R26.89);Muscle weakness (generalized) (M62.81)    Time: 3428-7681 PT Time Calculation (min) (ACUTE ONLY): 29 min   Charges:   PT Evaluation $PT Eval Moderate Complexity: 1 Mod          Bryceton Hantz Abner Greenspan, PT Acute Rehabilitation Services Pager: [phone] Office: [phone]   Enedina Finner Makynli Stills 12/01/2018, 12:29 PM

## 2018-12-01 NOTE — Progress Notes (Signed)
OT Cancellation Note  Patient Details Name: Karita Dralle MRN: [id number] DOB: 14-Jun-1930   Cancelled Treatment:    Reason Eval/Treat Not Completed: Active bedrest order  Zenovia Jarred, MSOT, OTR/L Behavioral Health OT/ Acute Relief OT [facility] Office: [place] 12/01/2018, 8:52 AM

## 2018-12-02 DIAGNOSIS — G459 Transient cerebral ischemic attack, unspecified: Principal | ICD-10-CM

## 2018-12-02 DIAGNOSIS — I10 Essential (primary) hypertension: Secondary | ICD-10-CM

## 2018-12-02 LAB — GLUCOSE, CAPILLARY
Glucose-Capillary: 96 mg/dL (ref 70–99)
Glucose-Capillary: 136 mg/dL — ABNORMAL HIGH (ref 70–99)
Glucose-Capillary: 93 mg/dL (ref 70–99)
Glucose-Capillary: 140 mg/dL — ABNORMAL HIGH (ref 70–99)

## 2018-12-02 LAB — CBC
HCT: 34.2 % — ABNORMAL LOW (ref 36.0–46.0)
Hemoglobin: 12.7 g/dL (ref 12.0–15.0)
MCH: 29.8 pg (ref 26.0–34.0)
MCHC: 37.1 g/dL — ABNORMAL HIGH (ref 30.0–36.0)
MCV: 80.3 fL (ref 80.0–100.0)
Platelets: 219 10*3/uL (ref 150–400)
RBC: 4.26 MIL/uL (ref 3.87–5.11)
RDW: 13.7 % (ref 11.5–15.5)
WBC: 7.1 10*3/uL (ref 4.0–10.5)
nRBC: 0 % (ref 0.0–0.2)

## 2018-12-02 LAB — BASIC METABOLIC PANEL
Anion gap: 12 (ref 5–15)
BUN: 10 mg/dL (ref 8–23)
CO2: 24 mmol/L (ref 22–32)
Calcium: 8.9 mg/dL (ref 8.9–10.3)
Chloride: 99 mmol/L (ref 98–111)
Creatinine, Ser: 0.53 mg/dL (ref 0.44–1.00)
GFR calc Af Amer: >60 mL/min (ref >60)
GFR calc non Af Amer: >60 mL/min (ref >60)
Glucose, Bld: 117 mg/dL — ABNORMAL HIGH (ref 70–99)
Potassium: 3.7 mmol/L (ref 3.5–5.1)
Sodium: 135 mmol/L (ref 135–145)

## 2018-12-02 NOTE — Progress Notes (Signed)
1Physical Therapy Treatment Patient Details Name: Dana Saunders MRN: [id number] DOB: 26-Apr-1930 Today's Date: 12/02/2018    History of Present Illness 85 yo admitted with aphasia with initial improvement and then worsening s/p tPA await MRI. PMhx: HTN, DM, HLD    PT Comments    Patient seen for activity progression and safety. Patient was attempting mobility on her own with decreased insight into lines and deficits. Assisted patient to bathroom and with ambulation around unit. Patient with some modest instability.  Current POC remains appropriate.   Follow Up Recommendations  Home health PT(pt was active with HHPT PTA)     Equipment Recommendations  None recommended by PT    Recommendations for Other Services       Precautions / Restrictions Precautions Precautions: Fall Precaution Comments: sBP <180 Restrictions Weight Bearing Restrictions: No    Mobility  Bed Mobility Overal bed mobility: Modified Independent             General bed mobility comments: up in chair  Transfers Overall transfer level: Needs assistance Equipment used: Rolling walker (2 wheeled) Transfers: Sit to/from Stand Sit to Stand: Min guard         General transfer comment: Min guard to stand from toilet for safety  Ambulation/Gait Ambulation/Gait assistance: Min guard Gait Distance (Feet): 180 Feet Assistive device: Rolling walker (2 wheeled) Gait Pattern/deviations: Step-through pattern;Decreased stride length;Trunk flexed     General Gait Details: Vcs for upright posture and positioning, some instability with list and modest LOB to the right x2   Stairs             Wheelchair Mobility    Modified Rankin (Stroke Patients Only) Modified Rankin (Stroke Patients Only) Pre-Morbid Rankin Score: Moderate disability Modified Rankin: Moderate disability     Balance Overall balance assessment: Needs assistance Sitting-balance support: No upper extremity supported;Feet  supported Sitting balance-Leahy Scale: Good     Standing balance support: Single extremity supported;During functional activity;Bilateral upper extremity supported Standing balance-Leahy Scale: Poor Standing balance comment: reliant on external support                            Cognition Arousal/Alertness: Awake/alert Behavior During Therapy: WFL for tasks assessed/performed Overall Cognitive Status: No family/caregiver present to determine baseline cognitive functioning Area of Impairment: Memory;Following commands;Safety/judgement;Problem solving;Orientation                 Orientation Level: Time;Situation;Disoriented to   Memory: Decreased short-term memory Following Commands: Follows one step commands consistently Safety/Judgement: Decreased awareness of safety   Problem Solving: Slow processing General Comments: slow processing, unaware of current situation and time. Unable to draw clock even with cues and example      Exercises      General Comments        Pertinent Vitals/Pain      Home Living                      Prior Function            PT Goals (current goals can now be found in the care plan section) Acute Rehab PT Goals Patient Stated Goal: return home PT Goal Formulation: With patient Time For Goal Achievement: 12/15/18 Potential to Achieve Goals: Good Progress towards PT goals: Progressing toward goals    Frequency    Min 3X/week      PT Plan Current plan remains appropriate    Co-evaluation  AM-PAC PT "6 Clicks" Mobility   Outcome Measure  Help needed turning from your back to your side while in a flat bed without using bedrails?: None Help needed moving from lying on your back to sitting on the side of a flat bed without using bedrails?: None Help needed moving to and from a bed to a chair (including a wheelchair)?: A Little Help needed standing up from a chair using your arms (e.g.,  wheelchair or bedside chair)?: A Little Help needed to walk in hospital room?: A Little Help needed climbing 3-5 steps with a railing? : A Little 6 Click Score: 20    End of Session Equipment Utilized During Treatment: Gait belt Activity Tolerance: Patient tolerated treatment well Patient left: in bed;with call bell/phone within reach Nurse Communication: Mobility status PT Visit Diagnosis: Other abnormalities of gait and mobility (R26.89);Muscle weakness (generalized) (M62.81)     Time: 0350-0938 PT Time Calculation (min) (ACUTE ONLY): 17 min  Charges:  $Gait Training: 8-22 mins                     Alben Deeds, PT DPT  Board Certified Neurologic Specialist [place] Pager [phone] Office [phone]    Duncan Dull 12/02/2018, 12:17 PM

## 2018-12-02 NOTE — Progress Notes (Signed)
STROKE TEAM PROGRESS NOTE   INTERVAL HISTORY Her RN is sitting in chair and calling her family. She is doing well and today is her birthday. I congratulated her. MRI no acute infarct. She likely had TIA. tPA likely helped her. PT/OT recommend home PT/OT. Pending loop tomorrow.  OBJECTIVE Vitals:   12/02/18 0400 12/02/18 0500 12/02/18 0600 12/02/18 0700  BP: (!) 130/117 (!) 145/132 123/67 (!) 147/64  Pulse: (!) 51 64 (!) 59 (!) 57  Resp: 17 17 15 16   Temp: 97.9 F (36.6 C)     TempSrc: Oral     SpO2: 98% 96% 97% 97%  Weight:      Height:        CBC:  Recent Labs  Lab 11/30/18 1309  12/01/18 0734 12/02/18 0330  WBC 5.6  --  6.5 7.1  NEUTROABS 3.2  --   --   --   HGB 13.1   < > 12.5 12.7  HCT 36.7   < > 35.1* 34.2*  MCV 82.1  --  81.6 80.3  PLT 234  --  233 219   < > = values in this interval not displayed.    Basic Metabolic Panel:  Recent Labs  Lab 12/01/18 0734 12/02/18 0330  NA 136 135  K 4.2 3.7  CL 99 99  CO2 26 24  GLUCOSE 103* 117*  BUN 11 10  CREATININE 0.63 0.53  CALCIUM 9.1 8.9    Lipid Panel:     Component Value Date/Time   CHOL 166 12/01/2018 0734   TRIG 45 12/01/2018 0734   HDL 71 12/01/2018 0734   CHOLHDL 2.3 12/01/2018 0734   VLDL 9 12/01/2018 0734   LDLCALC 86 12/01/2018 0734   HgbA1c:  Lab Results  Component Value Date   HGBA1C 5.5 12/01/2018   Urine Drug Screen: No results found for: LABOPIA, COCAINSCRNUR, LABBENZ, AMPHETMU, THCU, LABBARB  Alcohol Level No results found for: ETH  IMAGING  Ct Angio Head W Or Wo Contrast Ct Angio Neck W Or Wo Contrast 11/30/2018 IMPRESSION:  No significant carotid or vertebral artery stenosis in the neck No significant cranial stenosis. Negative for large vessel occlusion.    Ct Head Wo Contrast 11/30/2018 IMPRESSION:  Chronic atrophic and ischemic changes without acute abnormality.   MRI Brain Wo Contrast - pending   Transthoracic Echocardiogram  Pending   Bilateral Lower Extremity  Venous Dopplers Pending   ECG - SR rate 85  BPM. (See cardiology reading for complete details)   PHYSICAL EXAM  Temp:  [97.9 F (36.6 C)-98.7 F (37.1 C)] 97.9 F (36.6 C) (10/04 0400) Pulse Rate:  [34-72] 57 (10/04 0700) Resp:  [14-23] 16 (10/04 0700) BP: (119-171)/(54-137) 147/64 (10/04 0700) SpO2:  [94 %-100 %] 97 % (10/04 0700)  General - Well nourished, well developed, in no apparent distress.  Ophthalmologic - fundi not visualized due to noncooperation.  Cardiovascular - Regular rhythm and rate.  Mental Status -  Level of arousal and orientation to time, place, and person were intact, but not orientated to her age. Language including expression, naming, repetition, comprehension was assessed and found intact.  Cranial Nerves II - XII - II - Visual field intact OU. III, IV, VI - Extraocular movements intact. V - Facial sensation intact bilaterally. VII - left nasolabial fold flattening, likely chronic. VIII - Hearing & vestibular intact bilaterally. X - Palate elevates symmetrically. XI - Chin turning & shoulder shrug intact bilaterally. XII - Tongue protrusion intact.  Motor Strength -  The patient's strength was normal in all extremities and pronator drift was absent.  Bulk was normal and fasciculations were absent.   Motor Tone - Muscle tone was assessed at the neck and appendages and was normal.  Reflexes - The patient's reflexes were symmetrical in all extremities and she had no pathological reflexes.  Sensory - Light touch, temperature/pinprick were assessed and were symmetrical.    Coordination - The patient had normal movements in the hands with no ataxia or dysmetria.  Tremor was absent.  Gait and Station - deferred.    ASSESSMENT/PLAN Ms. Blayre Papania is a 85 y.o. female with history of diabetes, hypothyroidism, hypertension, hyperlipidemia presenting with aphasia. She received IV t-PA on Friday 11/30/2018 At 1415.  TIA: left cortical TIA,  concerning for cardioembolic  Resultant mild left facial droop  Code Stroke CT Head - not ordered  CT Head - Chronic atrophic and ischemic changes without acute abnormality.   MRI head - no acute infarct, chronic CMB right thalamus or IC.   CTA H&N - No significant carotid or vertebral artery stenosis in the neck No significant cranial stenosis. Negative for large vessel occlusion.   LE venous doppler no DVT  2D Echo - EF 50-55%  Consider loop recorder tomorrow  LDL - 86  HgbA1c - 5.5  VTE prophylaxis - SCDs  No antithrombotic prior to admission, now on ASA 81 and plavix 75. Recommend DAPT for 3 weeks and then ASA alone.   Patient will be counseled to be compliant with her antithrombotic medications  Ongoing aggressive stroke risk factor management  Therapy recommendations: Home PT/OT  Disposition:  Pending  Hypertension  Home BP meds: Cozaar 50 mg daily  Was on Cleviprex, now off  Stable . Permissive hypertension (OK if < 180/105) but gradually normalize in 5-7 days. . Long-term BP goal normotensive  Hyperlipidemia  Home Lipid lowering medication: Pravachol 40 mg daily  LDL 86, goal < 70  Current lipid lowering medication:  Resumed pravastatin 40   Continue statin at discharge  Diabetes  Home diabetic meds: Glucophage  HgbA1c 5.5, goal < 7.0  Controlled  SSI  CBG monitoring  Other Stroke Risk Factors  Advanced age  Former cigarette smoker - quit  Previous ETOH use  Other Active Problems  Hyponatremia Na - 134->135  Hospital day # 2  Rosalin Hawking, MD PhD Stroke Neurology 12/02/2018 10:11 AM     To contact Stroke Continuity provider, please refer to [email or web address] After hours, contact General Neurology

## 2018-12-02 NOTE — Plan of Care (Signed)
Pt's knowledge of stroke symptoms/signs improving

## 2018-12-03 ENCOUNTER — Encounter (HOSPITAL_COMMUNITY)
Admission: EM | Disposition: A | Discharge status: Home Health | Payer: Self-pay | Source: Home / Self Care | Attending: Neurology

## 2018-12-03 DIAGNOSIS — R2990 Unspecified symptoms and signs involving the nervous system: Secondary | ICD-10-CM

## 2018-12-03 DIAGNOSIS — Z23 Encounter for immunization: Secondary | ICD-10-CM | POA: Diagnosis not present

## 2018-12-03 DIAGNOSIS — E871 Hypo-osmolality and hyponatremia: Secondary | ICD-10-CM

## 2018-12-03 DIAGNOSIS — E119 Type 2 diabetes mellitus without complications: Secondary | ICD-10-CM

## 2018-12-03 HISTORY — PX: LOOP RECORDER INSERTION: EP1214

## 2018-12-03 LAB — BASIC METABOLIC PANEL
Anion gap: 10 (ref 5–15)
BUN: 11 mg/dL (ref 8–23)
CO2: 23 mmol/L (ref 22–32)
Calcium: 8.8 mg/dL — ABNORMAL LOW (ref 8.9–10.3)
Chloride: 103 mmol/L (ref 98–111)
Creatinine, Ser: 0.61 mg/dL (ref 0.44–1.00)
GFR calc Af Amer: >60 mL/min (ref >60)
GFR calc non Af Amer: >60 mL/min (ref >60)
Glucose, Bld: 112 mg/dL — ABNORMAL HIGH (ref 70–99)
Potassium: 3.9 mmol/L (ref 3.5–5.1)
Sodium: 136 mmol/L (ref 135–145)

## 2018-12-03 LAB — GLUCOSE, CAPILLARY
Glucose-Capillary: 97 mg/dL (ref 70–99)
Glucose-Capillary: 194 mg/dL — ABNORMAL HIGH (ref 70–99)
Glucose-Capillary: 99 mg/dL (ref 70–99)

## 2018-12-03 LAB — CBC
HCT: 33.4 % — ABNORMAL LOW (ref 36.0–46.0)
Hemoglobin: 12.5 g/dL (ref 12.0–15.0)
MCH: 30 pg (ref 26.0–34.0)
MCHC: 37.4 g/dL — ABNORMAL HIGH (ref 30.0–36.0)
MCV: 80.3 fL (ref 80.0–100.0)
Platelets: 221 10*3/uL (ref 150–400)
RBC: 4.16 MIL/uL (ref 3.87–5.11)
RDW: 13.6 % (ref 11.5–15.5)
WBC: 6.5 10*3/uL (ref 4.0–10.5)
nRBC: 0 % (ref 0.0–0.2)

## 2018-12-03 SURGERY — LOOP RECORDER INSERTION

## 2018-12-03 MED ORDER — ACETAMINOPHEN 325 MG PO TABS: 325.0000 mg | ORAL_TABLET | ORAL | Status: DC | PRN

## 2018-12-03 MED ORDER — CLOPIDOGREL BISULFATE 75 MG PO TABS: 75.0000 mg | ORAL_TABLET | Freq: Every day | ORAL | Status: DC

## 2018-12-03 MED ORDER — LIDOCAINE-EPINEPHRINE 1 %-1:100000 IJ SOLN
INTRAMUSCULAR | Status: AC
  Filled 2018-12-03: qty 1

## 2018-12-03 MED ORDER — CLOPIDOGREL BISULFATE 75 MG PO TABS: 75.0000 mg | ORAL_TABLET | Freq: Every day | ORAL | 0 refills | Status: DC

## 2018-12-03 MED ORDER — ONDANSETRON HCL 4 MG/2ML IJ SOLN: 4.0000 mg | Freq: Four times a day (QID) | INTRAMUSCULAR | Status: DC | PRN

## 2018-12-03 MED ORDER — LIDOCAINE-EPINEPHRINE 1 %-1:100000 IJ SOLN
INTRAMUSCULAR | Status: DC | PRN
  Administered 2018-12-03: 20 mL

## 2018-12-03 MED ORDER — ASPIRIN 81 MG PO TBEC: 81.0000 mg | DELAYED_RELEASE_TABLET | Freq: Every day | ORAL | Status: AC

## 2018-12-03 SURGICAL SUPPLY — 2 items
MONITOR REVEAL LINQ II (Prosthesis & Implant Heart) ×2 IMPLANT
PACK LOOP INSERTION (CUSTOM PROCEDURE TRAY) ×3 IMPLANT

## 2018-12-03 NOTE — Progress Notes (Signed)
Occupational Therapy Treatment Patient Details Name: Dana Saunders MRN: [id number] DOB: May 13, 1930 Today's Date: 12/03/2018    History of present illness 85 yo admitted with aphasia with initial improvement and then worsening s/p tPA await MRI. PMhx: HTN, DM, HLD   OT comments  Pt progressing toward states goals. Focused session on cognitive tasks with functional mobility and self care. Pt completing transfers at min guard- supervision level of A. Pt completed trail making task with mod-max VC's for successful completion. Pt showing difficulty dividing attention, needing to stop and think each prompt. Pt also needing cues for topographical orientation and STM to relocate room number. Cont to recommend pt have 24/7 supervision at home for safety. D/c recs remain appropriate. Will continue to follow acutely.     Follow Up Recommendations  Home health OT;Supervision/Assistance - 24 hour    Equipment Recommendations  None recommended by OT    Recommendations for Other Services      Precautions / Restrictions Precautions Precautions: Fall Precaution Comments: sBP <180 Restrictions Weight Bearing Restrictions: No       Mobility Bed Mobility               General bed mobility comments: up in chair  Transfers Overall transfer level: Needs assistance Equipment used: Rolling walker (2 wheeled) Transfers: Sit to/from Stand Sit to Stand: Min guard;Supervision         General transfer comment: min guard- supervision    Balance Overall balance assessment: Needs assistance Sitting-balance support: No upper extremity supported;Feet supported Sitting balance-Leahy Scale: Good     Standing balance support: Single extremity supported;During functional activity;Bilateral upper extremity supported Standing balance-Leahy Scale: Poor Standing balance comment: reliant on external support                           ADL either performed or assessed with clinical  judgement   ADL Overall ADL's : Needs assistance/impaired Eating/Feeding: Set up;Sitting   Grooming: Supervision/safety;Standing;Wash/dry hands                                 General ADL Comments: cont to experience cognitive deficits (Suspect baseline) as well as decreased activity tolerance     Vision Baseline Vision/History: No visual deficits Patient Visual Report: No change from baseline Vision Assessment?: No apparent visual deficits   Perception     Praxis      Cognition Arousal/Alertness: Awake/alert   Overall Cognitive Status: No family/caregiver present to determine baseline cognitive functioning Area of Impairment: Memory;Following commands;Safety/judgement;Problem solving;Orientation                 Orientation Level: Time;Disoriented to;Situation(able to state stroke)   Memory: Decreased short-term memory Following Commands: Follows one step commands consistently Safety/Judgement: Decreased awareness of safety   Problem Solving: Slow processing General Comments: cont to present with slow processing, able to state she had a stroke but difficulty elaborating. Needing cues for trail making        Exercises     Shoulder Instructions       General Comments      Pertinent Vitals/ Pain       Pain Assessment: No/denies pain  Home Living  Prior Functioning/Environment              Frequency  Min 2X/week        Progress Toward Goals  OT Goals(current goals can now be found in the care plan section)  Progress towards OT goals: Progressing toward goals  Acute Rehab OT Goals Patient Stated Goal: return home OT Goal Formulation: With patient Time For Goal Achievement: 12/15/18 Potential to Achieve Goals: Good  Plan      Co-evaluation                 AM-PAC OT "6 Clicks" Daily Activity     Outcome Measure   Help from another person eating meals?:  None Help from another person taking care of personal grooming?: None Help from another person toileting, which includes using toliet, bedpan, or urinal?: A Little Help from another person bathing (including washing, rinsing, drying)?: A Little Help from another person to put on and taking off regular upper body clothing?: None Help from another person to put on and taking off regular lower body clothing?: A Little 6 Click Score: 21    End of Session Equipment Utilized During Treatment: Gait belt;Rolling walker  OT Visit Diagnosis: Unsteadiness on feet (R26.81);Other abnormalities of gait and mobility (R26.89);Muscle weakness (generalized) (M62.81);Other symptoms and signs involving cognitive function   Activity Tolerance Patient tolerated treatment well   Patient Left in chair;with call bell/phone within reach;with chair alarm set   Nurse Communication Mobility status        Time: 4656-8127 OT Time Calculation (min): 20 min  Charges: OT General Charges $OT Visit: 1 Visit OT Treatments $Self Care/Home Management : 8-22 mins  Zenovia Jarred, MSOT, OTR/L [facility] Office: [place] 12/03/2018, 12:52 PM

## 2018-12-03 NOTE — TOC Transition Note (Signed)
Transition of Care [facility]) - CM/SW Discharge Note   Patient Details  Name: Dana Saunders MRN: [id number] Date of Birth: [birth year]  Transition of Care [facility]) CM/SW Contact:  Ella Bodo, RN Phone Number: 12/03/2018, 4:27 PM   Clinical Narrative:   85 yo admitted with aphasia with initial improvement and then worsening s/p tPA await MRI.  PTA, pt needed assistance with ADLS; lives with daughter, Maudie Mercury, who can provide 24h supervision.  Pt states she has all needed DME at home.  PT/OT and ST recommending HH follow up, and pt agreeable to services.  She prefers to use the agency she used with last admission, which upon chart review is [facility].  Referral to [place] with [facility] for follow up.      Final next level of care: [place] Barriers to Discharge: Barriers Resolved   Patient Goals and CMS Choice Patient states their goals for this hospitalization and ongoing recovery are:: to get back home CMS Medicare.gov Compare Post Acute Care list provided to:: Patient Choice offered to / list presented to : Adult Children                      Discharge Plan and Services   Discharge Planning Services: CM Consult Post Acute Care Choice: Home Health                    HH Arranged: PT, OT, Speech Therapy [place] (now [facility]) Date [place]: 12/03/18 Time [place]: 1626 Representative spoke with at [place]: [place] ([place]) Interventions     Readmission Risk Interventions Readmission Risk Prevention Plan 12/03/2018  Post Dischage Appt Complete  Medication Screening Complete  Transportation Screening Complete  Some recent data might be hidden   Reinaldo Raddle, RN, BSN  Trauma/Neuro ICU Case Manager [phone]

## 2018-12-03 NOTE — Progress Notes (Signed)
Physical Therapy Treatment Patient Details Name: Dana Saunders MRN: [id number] DOB: Apr 03, 1930 Today's Date: 12/03/2018    History of Present Illness 85 yo admitted with aphasia with initial improvement and then worsening s/p tPA await MRI. PMhx: HTN, DM, HLD    PT Comments    Patient recently back to bed after being up all day. She stated she knew she needed to do something, but really did not want to work on standing. Agreed bed exercises would be helpful. She reported Rt hip arthritis that has gotten worse while hospitalized with limited activity. Emphasized exercises for decreasing pain rt hip and for strengthening legs. Family arrived and pt wanted to spend time with them before leaving to get loop recorder.     Follow Up Recommendations  Home health PT(was active with HHPT PTA)     Equipment Recommendations  None recommended by PT    Recommendations for Other Services       Precautions / Restrictions Precautions Precautions: Fall Precaution Comments: sBP <180 Restrictions Weight Bearing Restrictions: No    Mobility  Bed Mobility               General bed mobility comments: up in chair  Transfers Overall transfer level: Needs assistance Equipment used: Rolling walker (2 wheeled) Transfers: Sit to/from Stand Sit to Stand: Min guard;Supervision         General transfer comment: min guard- supervision  Ambulation/Gait                 Stairs             Wheelchair Mobility    Modified Rankin (Stroke Patients Only) Modified Rankin (Stroke Patients Only) Pre-Morbid Rankin Score: Moderate disability Modified Rankin: Moderate disability     Balance Overall balance assessment: Needs assistance Sitting-balance support: No upper extremity supported;Feet supported Sitting balance-Leahy Scale: Good     Standing balance support: Single extremity supported;During functional activity;Bilateral upper extremity supported Standing  balance-Leahy Scale: Poor Standing balance comment: reliant on external support                            Cognition Arousal/Alertness: Awake/alert Behavior During Therapy: WFL for tasks assessed/performed Overall Cognitive Status: No family/caregiver present to determine baseline cognitive functioning Area of Impairment: Memory;Following commands;Safety/judgement;Problem solving;Orientation                 Orientation Level: Time;Disoriented to;Situation(able to state stroke)   Memory: Decreased short-term memory Following Commands: Follows one step commands consistently Safety/Judgement: Decreased awareness of safety   Problem Solving: Slow processing General Comments: occasional additional explanation of exercise needed to get correct technique      Exercises General Exercises - Lower Extremity Ankle Circles/Pumps: AROM;Both;10 reps;Supine Heel Slides: AROM;Right;10 reps;Supine Hip ABduction/ADduction: AROM;Right;Strengthening;Left;Other reps (comment);Supine(10 each hooklying with resistance; 5 reps RLE leg straight) Hip Flexion/Marching: AROM;Right;10 reps Other Exercises Other Exercises: bridging x 5 reps arms at her side; x 5 reps x 2 with arms across chest    General Comments        Pertinent Vitals/Pain Pain Assessment: No/denies pain    Home Living                      Prior Function            PT Goals (current goals can now be found in the care plan section) Acute Rehab PT Goals Patient Stated Goal: return home Time For Goal Achievement: 12/15/18  Potential to Achieve Goals: Good Progress towards PT goals: Progressing toward goals    Frequency    Min 3X/week      PT Plan Current plan remains appropriate    Co-evaluation              AM-PAC PT "6 Clicks" Mobility   Outcome Measure  Help needed turning from your back to your side while in a flat bed without using bedrails?: None Help needed moving from lying on  your back to sitting on the side of a flat bed without using bedrails?: None Help needed moving to and from a bed to a chair (including a wheelchair)?: A Little Help needed standing up from a chair using your arms (e.g., wheelchair or bedside chair)?: A Little Help needed to walk in hospital room?: A Little Help needed climbing 3-5 steps with a railing? : A Little 6 Click Score: 20    End of Session   Activity Tolerance: Patient limited by fatigue(had just gotten back to bed, did not want to stand for ex) Patient left: in bed;with call bell/phone within reach;with family/visitor present Nurse Communication: Other (comment)(RN stated about to go for her loop recorder) PT Visit Diagnosis: Other abnormalities of gait and mobility (R26.89);Muscle weakness (generalized) (M62.81)     Time: 6945-0388 PT Time Calculation (min) (ACUTE ONLY): 13 min  Charges:  $Therapeutic Exercise: 8-22 mins                       Barry Brunner, PT       Rexanne Mano 12/03/2018, 4:27 PM

## 2018-12-03 NOTE — Plan of Care (Signed)
Pt able to participate in ADLs, independent after set-up. Pt verbalized understanding of plan of care. Pt has great appetite, able to feed self.

## 2018-12-03 NOTE — Progress Notes (Signed)
Patient and patient niece received discharge paperwork and loop recorder supplies. Patient educated by loop recorder rep. RN accompanied pt. To car for discharge with all belongings.

## 2018-12-03 NOTE — Consult Note (Addendum)
ELECTROPHYSIOLOGY CONSULT NOTE  Patient ID: Dana Saunders MRN: [id number], DOB/AGE: 1930/05/30   Admit date: 11/30/2018 Date of Consult: 12/03/2018  Primary Physician: Renford Dills, MD Primary Cardiologist: No primary care provider on file.  Primary Electrophysiologist: New to None  Reason for Consultation: Cryptogenic stroke; recommendations regarding Implantable Loop Recorder  History of Present Illness EP has been asked to evaluate Dana Saunders for placement of an implantable loop recorder to monitor for atrial fibrillation by Dr Roda Shutters.  The patient was admitted on 11/30/2018 with aphashia and L facial droop.  They first developed symptoms while at home.  Imaging demonstrated L cortical TIA.  she has undergone workup for stroke including echocardiogram and carotid dopplers.  The patient has been monitored on telemetry which has demonstrated sinus rhythm with no arrhythmias.   Echocardiogram this admission demonstrated EF 50-55%, LA 2.6 cm.  Lab work is reviewed.  Prior to admission, the patient denies chest pain, shortness of breath, dizziness, palpitations, or syncope.  They are recovering from their stroke with plans to return home  at discharge.  Past Medical History:  Diagnosis Date   Diabetes mellitus without complication (HCC)    Hyperlipemia    Hypertension      Surgical History:  Past Surgical History:  Procedure Laterality Date   ESOPHAGOGASTRODUODENOSCOPY (EGD) WITH PROPOFOL N/A 05/20/2018   Procedure: ESOPHAGOGASTRODUODENOSCOPY (EGD) WITH PROPOFOL;  Surgeon: Kerin Salen, MD;  Location: [facility] ENDOSCOPY;  Service: Gastroenterology;  Laterality: N/A;   GASTROSTOMY N/A 05/20/2018   Procedure: Insertion Of Gastrostomy Tube and Repair of Volvulus;  Surgeon: Violeta Gelinas, MD;  Location: [facility] OR;  Service: General;  Laterality: N/A;   LAPAROTOMY N/A 05/20/2018   Procedure: EXPLORATORY LAPAROTOMY;  Surgeon: Violeta Gelinas, MD;  Location: [facility] OR;  Service: General;   Laterality: N/A;     Medications Prior to Admission  Medication Sig Dispense Refill Last Dose   acetaminophen (TYLENOL) 325 MG tablet Take 2 tablets (650 mg total) by mouth every 6 (six) hours as needed for mild pain (or Fever >/= 101). 30 tablet 0 Past Month at Unknown time   alendronate (FOSAMAX) 70 MG tablet Take 70 mg by mouth once a week.   11/23/2018   cetirizine (ZYRTEC) 10 MG tablet Take 10 mg by mouth daily.   11/30/2018 at Unknown time   feeding supplement, ENSURE ENLIVE, (ENSURE ENLIVE) LIQD Take 237 mLs by mouth 2 (two) times daily between meals. 237 mL 12 11/29/2018 at Unknown time   levothyroxine (SYNTHROID, LEVOTHROID) 88 MCG tablet Take 88 mcg by mouth every morning.   11/30/2018 at Unknown time   losartan (COZAAR) 50 MG tablet Take 50 mg by mouth daily.   11/30/2018 at Unknown time   metFORMIN (GLUCOPHAGE) 500 MG tablet Take 1,000 mg by mouth 2 (two) times daily.   11/30/2018 at Unknown time   Multiple Vitamin (MULTIVITAMIN WITH MINERALS) TABS tablet Take 1 tablet by mouth daily. 30 tablet 0 11/30/2018 at Unknown time   polyethylene glycol (MIRALAX / GLYCOLAX) packet Take 17 g by mouth daily. 14 each 0 Past Month at Unknown time   pravastatin (PRAVACHOL) 40 MG tablet Take 40 mg by mouth daily.   11/30/2018 at Unknown time    Inpatient Medications:    stroke: mapping our early stages of recovery book   Does not apply Once   aspirin EC  81 mg Oral Daily   Chlorhexidine Gluconate Cloth  6 each Topical Daily   clopidogrel  75 mg Oral Daily   insulin  aspart  0-15 Units Subcutaneous TID WC   levothyroxine  88 mcg Oral BH-q7a   pantoprazole  40 mg Oral Daily   pravastatin  40 mg Oral Daily    Allergies: No Known Allergies  Social History   Socioeconomic History   Marital status: Widowed    Spouse name: Not on file   Number of children: Not on file   Years of education: Not on file   Highest education level: Not on file  Occupational History   Not on  file  Social Needs   Financial resource strain: Not on file   Food insecurity    Worry: Not on file    Inability: Not on file   Transportation needs    Medical: Not on file    Non-medical: Not on file  Tobacco Use   Smoking status: Former Smoker   Smokeless tobacco: Never Used  Substance and Sexual Activity   Alcohol use: Not Currently    Frequency: Never   Drug use: Not on file   Sexual activity: Not on file  Lifestyle   Physical activity    Days per week: Not on file    Minutes per session: Not on file   Stress: Not on file  Relationships   Social connections    Talks on phone: Not on file    Gets together: Not on file    Attends religious service: Not on file    Active member of club or organization: Not on file    Attends meetings of clubs or organizations: Not on file    Relationship status: Not on file   Intimate partner violence    Fear of current or ex partner: Not on file    Emotionally abused: Not on file    Physically abused: Not on file    Forced sexual activity: Not on file  Other Topics Concern   Not on file  Social History Narrative   Not on file     Family History  Problem Relation Age of Onset   Diabetes Mother    Cancer Father       Review of Systems: All other systems reviewed and are otherwise negative except as noted above.  Physical Exam: Vitals:   12/03/18 0500 12/03/18 0600 12/03/18 0700 12/03/18 0800  BP: (!) 133/57 (!) 121/56 (!) 138/54 129/60  Pulse:      Resp: 18 15 15 15   Temp:    98.2 F (36.8 C)  TempSrc:    Oral  SpO2:      Weight:      Height:        GEN- The patient is well appearing, alert and oriented x 3 today.   Head- normocephalic, atraumatic Eyes-  Sclera clear, conjunctiva pink Ears- hearing intact Oropharynx- clear Neck- supple Lungs- Clear to ausculation bilaterally, normal work of breathing Heart- Regular rate and rhythm, no murmurs, rubs or gallops  GI- soft, NT, ND, +  BS Extremities- no clubbing, cyanosis, or edema MS- no significant deformity or atrophy Skin- no rash or lesion Psych- euthymic mood, full affect   Labs:   Lab Results  Component Value Date   WBC 6.5 12/03/2018   HGB 12.5 12/03/2018   HCT 33.4 (L) 12/03/2018   MCV 80.3 12/03/2018   PLT 221 12/03/2018    Recent Labs  Lab 12/03/18 0333  NA 136  K 3.9  CL 103  CO2 23  BUN 11  CREATININE 0.61  CALCIUM 8.8*  GLUCOSE 112*  Radiology/Studies: Ct Angio Head W Or Wo Contrast  Result Date: 11/30/2018 CLINICAL DATA:  Focal neuro deficit.  Slurred speech EXAM: CT ANGIOGRAPHY HEAD AND NECK TECHNIQUE: Multidetector CT imaging of the head and neck was performed using the standard protocol during bolus administration of intravenous contrast. Multiplanar CT image reconstructions and MIPs were obtained to evaluate the vascular anatomy. Carotid stenosis measurements (when applicable) are obtained utilizing NASCET criteria, using the distal internal carotid diameter as the denominator. CONTRAST:  75mL OMNIPAQUE IOHEXOL 350 MG/ML SOLN COMPARISON:  CT head 11/30/2018 FINDINGS: CTA NECK FINDINGS Aortic arch: Standard branching. Imaged portion shows no evidence of aneurysm or dissection. No significant stenosis of the major arch vessel origins. Atherosclerotic calcification aortic arch. Right carotid system: Normal right carotid. Negative for stenosis or irregularity Left carotid system: Mild atherosclerotic calcification left carotid bifurcation without significant stenosis. Vertebral arteries: Left vertebral artery dominant. Both vertebral arteries patent to the basilar without stenosis. Skeleton: Cervical kyphoscoliosis. Multilevel degenerative change. 2 mm anterolisthesis C3-4. No acute skeletal abnormality. Other neck: Negative Upper chest: Lung apices clear bilaterally. Review of the MIP images confirms the above findings CTA HEAD FINDINGS Anterior circulation: Atherosclerotic calcification in the  cavernous carotid bilaterally without significant stenosis. Anterior and middle cerebral arteries patent without significant stenosis. Hypoplastic right A1 segment. Posterior circulation: Both vertebral arteries patent to the basilar. Left vertebral dominant. PICA patent bilaterally. Basilar widely patent. Superior cerebellar and posterior cerebral arteries patent bilaterally without significant stenosis. Venous sinuses: Normal enhancement. Anatomic variants: None Review of the MIP images confirms the above findings IMPRESSION: No significant carotid or vertebral artery stenosis in the neck No significant cranial stenosis. Negative for large vessel occlusion. Electronically Signed   By: Marlan Palau M.D.   On: 11/30/2018 14:34   Ct Head Wo Contrast  Result Date: 11/30/2018 CLINICAL DATA:  Slurred speech for several hours EXAM: CT HEAD WITHOUT CONTRAST TECHNIQUE: Contiguous axial images were obtained from the base of the skull through the vertex without intravenous contrast. COMPARISON:  None. FINDINGS: Brain: No evidence of acute infarction, hemorrhage, hydrocephalus, extra-axial collection or mass lesion/mass effect. Chronic atrophic and ischemic changes are noted. Basal ganglia calcifications are seen. Vascular: No hyperdense vessel or unexpected calcification. Skull: Normal. Negative for fracture or focal lesion. Sinuses/Orbits: No acute finding. Other: None. IMPRESSION: Chronic atrophic and ischemic changes without acute abnormality. Electronically Signed   By: Alcide Clever M.D.   On: 11/30/2018 13:32   Ct Angio Neck W Or Wo Contrast  Result Date: 11/30/2018 CLINICAL DATA:  Focal neuro deficit.  Slurred speech EXAM: CT ANGIOGRAPHY HEAD AND NECK TECHNIQUE: Multidetector CT imaging of the head and neck was performed using the standard protocol during bolus administration of intravenous contrast. Multiplanar CT image reconstructions and MIPs were obtained to evaluate the vascular anatomy. Carotid stenosis  measurements (when applicable) are obtained utilizing NASCET criteria, using the distal internal carotid diameter as the denominator. CONTRAST:  75mL OMNIPAQUE IOHEXOL 350 MG/ML SOLN COMPARISON:  CT head 11/30/2018 FINDINGS: CTA NECK FINDINGS Aortic arch: Standard branching. Imaged portion shows no evidence of aneurysm or dissection. No significant stenosis of the major arch vessel origins. Atherosclerotic calcification aortic arch. Right carotid system: Normal right carotid. Negative for stenosis or irregularity Left carotid system: Mild atherosclerotic calcification left carotid bifurcation without significant stenosis. Vertebral arteries: Left vertebral artery dominant. Both vertebral arteries patent to the basilar without stenosis. Skeleton: Cervical kyphoscoliosis. Multilevel degenerative change. 2 mm anterolisthesis C3-4. No acute skeletal abnormality. Other neck: Negative Upper chest: Lung apices  clear bilaterally. Review of the MIP images confirms the above findings CTA HEAD FINDINGS Anterior circulation: Atherosclerotic calcification in the cavernous carotid bilaterally without significant stenosis. Anterior and middle cerebral arteries patent without significant stenosis. Hypoplastic right A1 segment. Posterior circulation: Both vertebral arteries patent to the basilar. Left vertebral dominant. PICA patent bilaterally. Basilar widely patent. Superior cerebellar and posterior cerebral arteries patent bilaterally without significant stenosis. Venous sinuses: Normal enhancement. Anatomic variants: None Review of the MIP images confirms the above findings IMPRESSION: No significant carotid or vertebral artery stenosis in the neck No significant cranial stenosis. Negative for large vessel occlusion. Electronically Signed   By: Marlan Palau M.D.   On: 11/30/2018 14:34   Mr Brain Wo Contrast  Result Date: 12/01/2018 CLINICAL DATA:  85 year old female with code stroke presentation. CTA negative for large  vessel occlusion yesterday. EXAM: MRI HEAD WITHOUT CONTRAST TECHNIQUE: Multiplanar, multiecho pulse sequences of the brain and surrounding structures were obtained without intravenous contrast. COMPARISON:  CT head and CTA head and neck yesterday. FINDINGS: Brain: No restricted diffusion to suggest acute infarction. No midline shift, mass effect, evidence of mass lesion, ventriculomegaly, extra-axial collection or acute intracranial hemorrhage. Cervicomedullary junction and pituitary are within normal limits. Patchy and confluent bilateral cerebral white matter T2 and FLAIR hyperintensity. Similar patchy T2 hyperintensity in the pons, and also both thalami. The basal ganglia appear relatively spared. There is evidence of a small chronic microhemorrhage in the right lateral thalamus or internal capsule on series 6, image 50. Second small subcortical white matter chronic micro hemorrhages noted in the right superior frontal gyrus on image 74. No other chronic cerebral blood products. No cortical encephalomalacia identified. The cerebellum appears normal. Vascular: Major intracranial vascular flow voids are preserved. Skull and upper cervical spine: Degenerative cervical spinal stenosis at C3-C4 related to anterolisthesis and bulky disc bulge or protrusion on series 7, image 12. Background bone marrow signal appears normal. Sinuses/Orbits: Postoperative changes to the left globe, otherwise negative orbits. Paranasal sinuses are clear. Other: Mastoids are clear. Visible internal auditory structures appear normal. Scalp and face soft tissues appear negative. IMPRESSION: 1. No acute intracranial abnormality. 2. Evidence of fairly advanced chronic small vessel disease, including a chronic micro-hemorrhage in the right lateral thalamus or internal capsule. 3. Degenerative cervical spinal stenosis at C3-C4 in the setting of spondylolisthesis. There may be associated degenerative spinal cord mass effect. Electronically Signed    By: Odessa Fleming M.D.   On: 12/01/2018 14:55   Vas [place] Lower Extremity Venous (dvt)  Result Date: 12/01/2018  Lower Venous Study Indications: Stroke.  Comparison Study: No prior study on file for comparison Performing Technologist: Sherren Kerns RVS  Examination Guidelines: A complete evaluation includes B-mode imaging, spectral Doppler, color Doppler, and power Doppler as needed of all accessible portions of each vessel. Bilateral testing is considered an integral part of a complete examination. Limited examinations for reoccurring indications may be performed as noted.  +---------+---------------+---------+-----------+----------+--------------+  RIGHT     Compressibility Phasicity Spontaneity Properties Thrombus Aging  +---------+---------------+---------+-----------+----------+--------------+  CFV       Full            Yes       Yes                                    +---------+---------------+---------+-----------+----------+--------------+  SFJ       Full                                                             +---------+---------------+---------+-----------+----------+--------------+  FV Prox   Full                                                             +---------+---------------+---------+-----------+----------+--------------+  FV Mid    Full                                                             +---------+---------------+---------+-----------+----------+--------------+  FV Distal Full                                                             +---------+---------------+---------+-----------+----------+--------------+  PFV       Full                                                             +---------+---------------+---------+-----------+----------+--------------+  POP       Full            Yes       Yes                                    +---------+---------------+---------+-----------+----------+--------------+  PTV       Full                                                              +---------+---------------+---------+-----------+----------+--------------+  PERO      Full                                                             +---------+---------------+---------+-----------+----------+--------------+   +---------+---------------+---------+-----------+----------+--------------+  LEFT      Compressibility Phasicity Spontaneity Properties Thrombus Aging  +---------+---------------+---------+-----------+----------+--------------+  CFV       Full            Yes       Yes                                    +---------+---------------+---------+-----------+----------+--------------+  SFJ       Full                                                             +---------+---------------+---------+-----------+----------+--------------+  FV Prox   Full                                                             +---------+---------------+---------+-----------+----------+--------------+  FV Mid    Full                                                             +---------+---------------+---------+-----------+----------+--------------+  FV Distal Full                                                             +---------+---------------+---------+-----------+----------+--------------+  PFV       Full                                                             +---------+---------------+---------+-----------+----------+--------------+  POP       Full            Yes       Yes                                    +---------+---------------+---------+-----------+----------+--------------+  PTV       Full                                                             +---------+---------------+---------+-----------+----------+--------------+  PERO      Full                                                             +---------+---------------+---------+-----------+----------+--------------+     Summary: Right: There is no evidence of deep vein thrombosis in the lower extremity. Left: There is no evidence of deep  vein thrombosis in the lower extremity.  *See table(s) above for measurements and observations. Electronically signed by Monica Martinez MD on 12/01/2018 at 1:47:00 PM.    Final     12-lead ECG 10/2 showed NSR at 85 bpm (personally reviewed) All prior EKG's in EPIC reviewed with no documented atrial fibrillation  Telemetry NSR 60-70s (personally reviewed)  Assessment and Plan:  1. Cryptogenic stroke The patient presents with cryptogenic stroke.  I spoke at length with the patient about monitoring for afib with an implantable loop recorder.  Risks, benefits, and alteratives to implantable loop recorder were discussed  with the patient today.   At this time, the patient is very clear in their decision to proceed with implantable loop recorder.   Wound care was reviewed with the patient (keep incision clean and dry for 3 days).  Wound check scheduled and entered in AVS. Please call with questions.   Graciella Freer, PA-C 12/03/2018 11:06 AM   EP Attending  Patient seen and examined. Agree with the findings as above. The patient has had a cryptogenic stroke. I have discussed the indications/risks/benefits/goals/expectations of ILR insertion with the patient and she wishes to proceed.   Leonia Reeves.D.

## 2018-12-03 NOTE — Discharge Summary (Signed)
Stroke Discharge Summary  Patient ID: Dana Saunders   MRN: [id number]      DOB: Jul 25, 1930  Date of Admission: 11/30/2018 Date of Discharge: 12/03/2018  Attending Physician:  Marvel Plan, MD, Stroke MD Consultant(s):    Lewayne Bunting, MD (electrophysiology)  Patient's PCP:  Renford Dills, MD  DISCHARGE DIAGNOSIS:  Principal Problem:   Stroke-like episode s/p tPA Active Problems:   Hyponatremia   Diabetes mellitus type 2, noninsulin dependent (HCC)   Hyperlipidemia   Benign essential HTN  Past Medical History:  Diagnosis Date  . Diabetes mellitus without complication (HCC)   . Hyperlipemia   . Hypertension    Past Surgical History:  Procedure Laterality Date  . ESOPHAGOGASTRODUODENOSCOPY (EGD) WITH PROPOFOL N/A 05/20/2018   Procedure: ESOPHAGOGASTRODUODENOSCOPY (EGD) WITH PROPOFOL;  Surgeon: Kerin Salen, MD;  Location: [facility] ENDOSCOPY;  Service: Gastroenterology;  Laterality: N/A;  . GASTROSTOMY N/A 05/20/2018   Procedure: Insertion Of Gastrostomy Tube and Repair of Volvulus;  Surgeon: Violeta Gelinas, MD;  Location: [facility] OR;  Service: General;  Laterality: N/A;  . LAPAROTOMY N/A 05/20/2018   Procedure: EXPLORATORY LAPAROTOMY;  Surgeon: Violeta Gelinas, MD;  Location: [facility] OR;  Service: General;  Laterality: N/A;    LABORATORY STUDIES CBC    Component Value Date/Time   WBC 6.5 12/03/2018 0333   RBC 4.16 12/03/2018 0333   HGB 12.5 12/03/2018 0333   HCT 33.4 (L) 12/03/2018 0333   PLT 221 12/03/2018 0333   MCV 80.3 12/03/2018 0333   MCH 30.0 12/03/2018 0333   MCHC 37.4 (H) 12/03/2018 0333   RDW 13.6 12/03/2018 0333   LYMPHSABS 1.6 11/30/2018 1309   MONOABS 0.7 11/30/2018 1309   EOSABS 0.0 11/30/2018 1309   BASOSABS 0.0 11/30/2018 1309   CMP    Component Value Date/Time   NA 136 12/03/2018 0333   K 3.9 12/03/2018 0333   CL 103 12/03/2018 0333   CO2 23 12/03/2018 0333   GLUCOSE 112 (H) 12/03/2018 0333   BUN 11 12/03/2018 0333   CREATININE 0.61 12/03/2018 0333    CALCIUM 8.8 (L) 12/03/2018 0333   PROT 7.3 05/21/2018 0216   ALBUMIN 3.5 05/21/2018 0216   AST 23 05/21/2018 0216   ALT 13 05/21/2018 0216   ALKPHOS 44 05/21/2018 0216   BILITOT 0.8 05/21/2018 0216   GFRNONAA >60 12/03/2018 0333   GFRAA >60 12/03/2018 0333   COAGS Lab Results  Component Value Date   INR 1.1 11/30/2018   Lipid Panel    Component Value Date/Time   CHOL 166 12/01/2018 0734   TRIG 45 12/01/2018 0734   HDL 71 12/01/2018 0734   CHOLHDL 2.3 12/01/2018 0734   VLDL 9 12/01/2018 0734   LDLCALC 86 12/01/2018 0734   HgbA1C  Lab Results  Component Value Date   HGBA1C 5.5 12/01/2018     SIGNIFICANT DIAGNOSTIC STUDIES  Ct Head Wo Contrast 11/30/2018 Chronic atrophic and ischemic changes without acute abnormality.   Ct Angio Head W Or Wo Contrast Ct Angio Neck W Or Wo Contrast 11/30/2018 No significant carotid or vertebral artery stenosis in the neck No significant cranial stenosis. Negative for large vessel occlusion.   MRI Brain Wo Contrast 12/01/2018 1. No acute intracranial abnormality. 2. Evidence of fairly advanced chronic small vessel disease, including a chronic micro-hemorrhage in the right lateral thalamus or internal capsule. 3. Degenerative cervical spinal stenosis at C3-C4 in the setting of spondylolisthesis. There may be associated degenerative spinal cord mass effect.  Transthoracic Echocardiogram  12/01/2018 1.  Left ventricular ejection fraction, by visual estimation, is 50 to 55%. The left ventricle has normal function. There is moderately increased left ventricular hypertrophy. 2. Left ventricular diastolic Doppler parameters are consistent with pseudonormalization pattern of LV diastolic filling. 3. Global right ventricle has normal systolic function.The right ventricular size is normal. No increase in right ventricular wall thickness. 4. Left atrial size was normal. 5. Right atrial size was normal. 6. Mild aortic valve annular  calcification. 7. The mitral valve is normal in structure. Mild mitral valve regurgitation. 8. The tricuspid valve is normal in structure. Tricuspid valve regurgitation is trivial. 9. The aortic valve is normal in structure. Aortic valve regurgitation is mild to moderate by color flow Doppler. 10. The pulmonic valve was grossly normal. Pulmonic valve regurgitation is not visualized by color flow Doppler. 11. Normal pulmonary artery systolic pressure. 12. The inferior vena cava is normal in size with greater than 50% respiratory variability, suggesting right atrial pressure of 3 mmHg. 13. The atrial septum is grossly normal.  2D Echocardiogram 12/01/2018 1. Left ventricular ejection fraction, by visual estimation, is 50 to 55%. The left ventricle has normal function. There is moderately increased left ventricular hypertrophy.  2. Left ventricular diastolic Doppler parameters are consistent with pseudonormalization pattern of LV diastolic filling.  3. Global right ventricle has normal systolic function.The right ventricular size is normal. No increase in right ventricular wall thickness.  4. Left atrial size was normal.  5. Right atrial size was normal.  6. Mild aortic valve annular calcification.  7. The mitral valve is normal in structure. Mild mitral valve regurgitation.  8. The tricuspid valve is normal in structure. Tricuspid valve regurgitation is trivial.  9. The aortic valve is normal in structure. Aortic valve regurgitation is mild to moderate by color flow Doppler. 10. The pulmonic valve was grossly normal. Pulmonic valve regurgitation is not visualized by color flow Doppler. 11. Normal pulmonary artery systolic pressure. 12. The inferior vena cava is normal in size with greater than 50% respiratory variability, suggesting right atrial pressure of 3 mmHg. 13. The atrial septum is grossly normal.  Bilateral Lower Extremity Venous Dopplers 12/01/2018 Right: There is no evidence of  deep vein thrombosis in the lower extremity. Left: There is no evidence of deep vein thrombosis in the lower extremity.  ECG - SR rate 85  BPM. (See cardiology reading for complete details)    HISTORY OF PRESENT ILLNESS Dana Saunders is a 85 y.o. female with history of diabetes, hypertension, hyperlipidemia who presents with aphasia that started abruptly this morning 11/30/2018.  She was in her normal state of health and sitting with her daughter at 34.  She was on the phone with somebody and speaking normally.  When she got off the phone her daughter started talking to her and at that point she began garbling her words.  Due to this she was brought into the emergency room.  In route she cleared essentially completely and therefore she was initially considered TIA. Subsequently however, she began worsening again and therefore code stroke was activated. She was given IV TPA and taken for stat CT angiogram which was negative for LVO. Modified Rankin Scale: 3.  HOSPITAL COURSE [name] is a 85 y.o. female with history of diabetes, hypothyroidism, hypertension, hyperlipidemia presenting with aphasia. She received IV t-PA on Friday 11/30/2018 At 1415.  Stroke symptoms s/p tPA: left cortical TIA, concerning for cardioembolic source  Resultant mild left facial droop, chronic  Code Stroke CT Head - not  ordered  CT Head - Chronic atrophic and ischemic changes without acute abnormality.   MRI head - no acute infarct, chronic CMB right thalamus or IC.   CTA H&N - No significant carotid or vertebral artery stenosis in the neck No significant cranial stenosis. Negative for large vessel occlusion.   LE venous doppler no DVT  2D Echo - EF 50-55%  Loop recorder placed to look for AF as source of stroke   LDL - 86  HgbA1c - 5.5  No antithrombotic prior to admission, now on ASA 81 and plavix 75. Recommend DAPT for 3 weeks and then ASA alone.   Therapy recommendations: Home  PT/OT  Disposition:  return home  Hypertension  Home BP meds: Cozaar 50 mg daily  Was on Cleviprex, now off  Stable  Long-term BP goal normotensive  Hyperlipidemia  Home Lipid lowering medication: Pravachol 40 mg daily  LDL 86, goal < 70  Current lipid lowering medication:  Resumed pravastatin 40   Continue statin at discharge  Diabetes, Controlled  Home diabetic meds: Glucophage  HgbA1c 5.5, goal < 7.0  Other Stroke Risk Factors  Advanced age  Former cigarette smoker - quit  Previous ETOH use  Other Active Problems  Hyponatremia Na - 134->135->136  DISCHARGE EXAM Blood pressure (!) 120/105, pulse 69, temperature 98.8 F (37.1 C), temperature source Oral, resp. rate 17, height 5\' 3"  (1.6 m), weight 57.6 kg, SpO2 97 %. General - Well nourished, well developed, in no apparent distress.  Ophthalmologic - fundi not visualized due to noncooperation.  Cardiovascular - Regular rhythm and rate.  Mental Status -  Level of arousal and orientation to time, place, and person were intact, but not orientated to her age. Language including expression, naming, repetition, comprehension was assessed and found intact.  Cranial Nerves II - XII - II - Visual field intact OU. III, IV, VI - Extraocular movements intact. V - Facial sensation intact bilaterally. VII - left nasolabial fold flattening, likely chronic. VIII - Hearing & vestibular intact bilaterally. X - Palate elevates symmetrically. XI - Chin turning & shoulder shrug intact bilaterally. XII - Tongue protrusion intact.  Motor Strength - The patient's strength was normal in all extremities and pronator drift was absent.  Bulk was normal and fasciculations were absent.   Motor Tone - Muscle tone was assessed at the neck and appendages and was normal.  Reflexes - The patient's reflexes were symmetrical in all extremities and she had no pathological reflexes.  Sensory - Light touch,  temperature/pinprick were assessed and were symmetrical.    Coordination - The patient had normal movements in the hands with no ataxia or dysmetria.  Tremor was absent.  Gait and Station - deferred.  Discharge Diet   Carb modified thin liquids  DISCHARGE PLAN  Disposition:  Return home  Home Health PT and OT  aspirin 81 mg daily and clopidogrel 75 mg daily for secondary stroke prevention for 3 weeks then ASPIRIN alone.  Ongoing stroke risk factor control by Primary Care Physician at time of discharge  Follow-up Renford DillsPolite, Ronald, MD in 2 weeks.  Follow-up in [facility] in 4 weeks, office to schedule an appointment.  Follow-up [facility] for implantable loop recorder wound check in 10-14 days, office to schedule an appointment.  [facility] atrial fibrillation as source of stroke. If found, they will notify patient.  35 minutes were spent preparing discharge.  Marvel PlanJindong Onix Jumper, MD PhD  Stroke Neurology 12/03/2018 6:56 PM

## 2018-12-03 NOTE — Discharge Instructions (Signed)
Care After Your Loop Recorder   You have a [employer] Loop Recorder    Monitor your cardiac device site for redness, swelling, and drainage. Call the device clinic at [phone] if you experience these symptoms or fever/chills.   You may shower 3 days after your loop recorder implant and wash your incision with soap and water. Avoid lotions, ointments, or perfumes over your incision until it is well-healed.   You may use a hot tub or a pool AFTER your wound check appointment if the incision is completely closed.   Your device is MRI compatible.    Remote monitoring is used to monitor your cardiac device from home. This monitoring is scheduled every month days by our office. It allows [place] to keep an eye on the functioning of your device to ensure it is working properly.  Please take ASA 81 (buy over the counter) and Plavix (prescribed for 21 days) daily for 3 weeks and then ASA alone after.  Will set up neurology stroke clinic follow up in 4 weeks at [facility]

## 2018-12-04 ENCOUNTER — Encounter (HOSPITAL_COMMUNITY): Payer: Self-pay | Admitting: Internal Medicine

## 2018-12-06 DIAGNOSIS — M810 Age-related osteoporosis without current pathological fracture: Secondary | ICD-10-CM | POA: Diagnosis not present

## 2018-12-06 DIAGNOSIS — I083 Combined rheumatic disorders of mitral, aortic and tricuspid valves: Secondary | ICD-10-CM | POA: Diagnosis not present

## 2018-12-06 DIAGNOSIS — E440 Moderate protein-calorie malnutrition: Secondary | ICD-10-CM | POA: Diagnosis not present

## 2018-12-06 DIAGNOSIS — I69322 Dysarthria following cerebral infarction: Secondary | ICD-10-CM | POA: Diagnosis not present

## 2018-12-06 DIAGNOSIS — E039 Hypothyroidism, unspecified: Secondary | ICD-10-CM | POA: Diagnosis not present

## 2018-12-06 DIAGNOSIS — F3289 Other specified depressive episodes: Secondary | ICD-10-CM | POA: Diagnosis not present

## 2018-12-06 DIAGNOSIS — R29810 Facial weakness: Secondary | ICD-10-CM | POA: Diagnosis not present

## 2018-12-06 DIAGNOSIS — I10 Essential (primary) hypertension: Secondary | ICD-10-CM | POA: Diagnosis not present

## 2018-12-06 DIAGNOSIS — I69320 Aphasia following cerebral infarction: Secondary | ICD-10-CM | POA: Diagnosis not present

## 2018-12-10 DIAGNOSIS — E440 Moderate protein-calorie malnutrition: Secondary | ICD-10-CM | POA: Diagnosis not present

## 2018-12-10 DIAGNOSIS — I083 Combined rheumatic disorders of mitral, aortic and tricuspid valves: Secondary | ICD-10-CM | POA: Diagnosis not present

## 2018-12-10 DIAGNOSIS — E039 Hypothyroidism, unspecified: Secondary | ICD-10-CM | POA: Diagnosis not present

## 2018-12-10 DIAGNOSIS — F3289 Other specified depressive episodes: Secondary | ICD-10-CM | POA: Diagnosis not present

## 2018-12-10 DIAGNOSIS — M810 Age-related osteoporosis without current pathological fracture: Secondary | ICD-10-CM | POA: Diagnosis not present

## 2018-12-10 DIAGNOSIS — R29810 Facial weakness: Secondary | ICD-10-CM | POA: Diagnosis not present

## 2018-12-10 DIAGNOSIS — I10 Essential (primary) hypertension: Secondary | ICD-10-CM | POA: Diagnosis not present

## 2018-12-10 DIAGNOSIS — I69320 Aphasia following cerebral infarction: Secondary | ICD-10-CM | POA: Diagnosis not present

## 2018-12-10 DIAGNOSIS — I69322 Dysarthria following cerebral infarction: Secondary | ICD-10-CM | POA: Diagnosis not present

## 2018-12-11 DIAGNOSIS — E039 Hypothyroidism, unspecified: Secondary | ICD-10-CM | POA: Diagnosis not present

## 2018-12-11 DIAGNOSIS — M810 Age-related osteoporosis without current pathological fracture: Secondary | ICD-10-CM | POA: Diagnosis not present

## 2018-12-11 DIAGNOSIS — R29810 Facial weakness: Secondary | ICD-10-CM | POA: Diagnosis not present

## 2018-12-11 DIAGNOSIS — I69320 Aphasia following cerebral infarction: Secondary | ICD-10-CM | POA: Diagnosis not present

## 2018-12-11 DIAGNOSIS — E440 Moderate protein-calorie malnutrition: Secondary | ICD-10-CM | POA: Diagnosis not present

## 2018-12-11 DIAGNOSIS — F3289 Other specified depressive episodes: Secondary | ICD-10-CM | POA: Diagnosis not present

## 2018-12-11 DIAGNOSIS — I083 Combined rheumatic disorders of mitral, aortic and tricuspid valves: Secondary | ICD-10-CM | POA: Diagnosis not present

## 2018-12-11 DIAGNOSIS — I10 Essential (primary) hypertension: Secondary | ICD-10-CM | POA: Diagnosis not present

## 2018-12-11 DIAGNOSIS — I69322 Dysarthria following cerebral infarction: Secondary | ICD-10-CM | POA: Diagnosis not present

## 2018-12-13 ENCOUNTER — Other Ambulatory Visit: Payer: Self-pay

## 2018-12-13 ENCOUNTER — Ambulatory Visit (INDEPENDENT_AMBULATORY_CARE_PROVIDER_SITE_OTHER): Payer: Medicare HMO | Admitting: Student

## 2018-12-13 DIAGNOSIS — R2990 Unspecified symptoms and signs involving the nervous system: Secondary | ICD-10-CM

## 2018-12-13 LAB — CUP PACEART INCLINIC DEVICE CHECK
Date Time Interrogation Session: 20201015135500
Implantable Pulse Generator Implant Date: 20201005

## 2018-12-13 NOTE — Progress Notes (Signed)
ILR wound check in clinic. Steri strips removed. Wound well healed. Home monitor transmitting nightly. No episodes. Questions answered. R waves 1.5 mV and above.

## 2018-12-18 DIAGNOSIS — I10 Essential (primary) hypertension: Secondary | ICD-10-CM | POA: Diagnosis not present

## 2018-12-18 DIAGNOSIS — E7800 Pure hypercholesterolemia, unspecified: Secondary | ICD-10-CM | POA: Diagnosis not present

## 2018-12-18 DIAGNOSIS — E039 Hypothyroidism, unspecified: Secondary | ICD-10-CM | POA: Diagnosis not present

## 2018-12-18 DIAGNOSIS — M810 Age-related osteoporosis without current pathological fracture: Secondary | ICD-10-CM | POA: Diagnosis not present

## 2018-12-18 DIAGNOSIS — E1169 Type 2 diabetes mellitus with other specified complication: Secondary | ICD-10-CM | POA: Diagnosis not present

## 2018-12-18 DIAGNOSIS — R2990 Unspecified symptoms and signs involving the nervous system: Secondary | ICD-10-CM | POA: Diagnosis not present

## 2018-12-19 DIAGNOSIS — I69320 Aphasia following cerebral infarction: Secondary | ICD-10-CM | POA: Diagnosis not present

## 2018-12-19 DIAGNOSIS — E039 Hypothyroidism, unspecified: Secondary | ICD-10-CM | POA: Diagnosis not present

## 2018-12-19 DIAGNOSIS — R29810 Facial weakness: Secondary | ICD-10-CM | POA: Diagnosis not present

## 2018-12-19 DIAGNOSIS — I69322 Dysarthria following cerebral infarction: Secondary | ICD-10-CM | POA: Diagnosis not present

## 2018-12-19 DIAGNOSIS — E440 Moderate protein-calorie malnutrition: Secondary | ICD-10-CM | POA: Diagnosis not present

## 2018-12-19 DIAGNOSIS — M810 Age-related osteoporosis without current pathological fracture: Secondary | ICD-10-CM | POA: Diagnosis not present

## 2018-12-19 DIAGNOSIS — F3289 Other specified depressive episodes: Secondary | ICD-10-CM | POA: Diagnosis not present

## 2018-12-19 DIAGNOSIS — I10 Essential (primary) hypertension: Secondary | ICD-10-CM | POA: Diagnosis not present

## 2018-12-19 DIAGNOSIS — I083 Combined rheumatic disorders of mitral, aortic and tricuspid valves: Secondary | ICD-10-CM | POA: Diagnosis not present

## 2018-12-20 DIAGNOSIS — M810 Age-related osteoporosis without current pathological fracture: Secondary | ICD-10-CM | POA: Diagnosis not present

## 2018-12-20 DIAGNOSIS — I083 Combined rheumatic disorders of mitral, aortic and tricuspid valves: Secondary | ICD-10-CM | POA: Diagnosis not present

## 2018-12-20 DIAGNOSIS — I69320 Aphasia following cerebral infarction: Secondary | ICD-10-CM | POA: Diagnosis not present

## 2018-12-20 DIAGNOSIS — F3289 Other specified depressive episodes: Secondary | ICD-10-CM | POA: Diagnosis not present

## 2018-12-20 DIAGNOSIS — E039 Hypothyroidism, unspecified: Secondary | ICD-10-CM | POA: Diagnosis not present

## 2018-12-20 DIAGNOSIS — I69322 Dysarthria following cerebral infarction: Secondary | ICD-10-CM | POA: Diagnosis not present

## 2018-12-20 DIAGNOSIS — R29810 Facial weakness: Secondary | ICD-10-CM | POA: Diagnosis not present

## 2018-12-20 DIAGNOSIS — E440 Moderate protein-calorie malnutrition: Secondary | ICD-10-CM | POA: Diagnosis not present

## 2018-12-20 DIAGNOSIS — I10 Essential (primary) hypertension: Secondary | ICD-10-CM | POA: Diagnosis not present

## 2018-12-26 DIAGNOSIS — M810 Age-related osteoporosis without current pathological fracture: Secondary | ICD-10-CM | POA: Diagnosis not present

## 2018-12-26 DIAGNOSIS — I69322 Dysarthria following cerebral infarction: Secondary | ICD-10-CM | POA: Diagnosis not present

## 2018-12-26 DIAGNOSIS — I10 Essential (primary) hypertension: Secondary | ICD-10-CM | POA: Diagnosis not present

## 2018-12-26 DIAGNOSIS — F3289 Other specified depressive episodes: Secondary | ICD-10-CM | POA: Diagnosis not present

## 2018-12-26 DIAGNOSIS — R29810 Facial weakness: Secondary | ICD-10-CM | POA: Diagnosis not present

## 2018-12-26 DIAGNOSIS — I69320 Aphasia following cerebral infarction: Secondary | ICD-10-CM | POA: Diagnosis not present

## 2018-12-26 DIAGNOSIS — I083 Combined rheumatic disorders of mitral, aortic and tricuspid valves: Secondary | ICD-10-CM | POA: Diagnosis not present

## 2018-12-26 DIAGNOSIS — E440 Moderate protein-calorie malnutrition: Secondary | ICD-10-CM | POA: Diagnosis not present

## 2018-12-26 DIAGNOSIS — E039 Hypothyroidism, unspecified: Secondary | ICD-10-CM | POA: Diagnosis not present

## 2018-12-27 DIAGNOSIS — E039 Hypothyroidism, unspecified: Secondary | ICD-10-CM | POA: Diagnosis not present

## 2018-12-27 DIAGNOSIS — I69322 Dysarthria following cerebral infarction: Secondary | ICD-10-CM | POA: Diagnosis not present

## 2018-12-27 DIAGNOSIS — I10 Essential (primary) hypertension: Secondary | ICD-10-CM | POA: Diagnosis not present

## 2018-12-27 DIAGNOSIS — R29810 Facial weakness: Secondary | ICD-10-CM | POA: Diagnosis not present

## 2018-12-27 DIAGNOSIS — M810 Age-related osteoporosis without current pathological fracture: Secondary | ICD-10-CM | POA: Diagnosis not present

## 2018-12-27 DIAGNOSIS — E440 Moderate protein-calorie malnutrition: Secondary | ICD-10-CM | POA: Diagnosis not present

## 2018-12-27 DIAGNOSIS — F3289 Other specified depressive episodes: Secondary | ICD-10-CM | POA: Diagnosis not present

## 2018-12-27 DIAGNOSIS — I083 Combined rheumatic disorders of mitral, aortic and tricuspid valves: Secondary | ICD-10-CM | POA: Diagnosis not present

## 2018-12-27 DIAGNOSIS — I69320 Aphasia following cerebral infarction: Secondary | ICD-10-CM | POA: Diagnosis not present

## 2019-01-02 DIAGNOSIS — M810 Age-related osteoporosis without current pathological fracture: Secondary | ICD-10-CM | POA: Diagnosis not present

## 2019-01-02 DIAGNOSIS — E440 Moderate protein-calorie malnutrition: Secondary | ICD-10-CM | POA: Diagnosis not present

## 2019-01-02 DIAGNOSIS — I10 Essential (primary) hypertension: Secondary | ICD-10-CM | POA: Diagnosis not present

## 2019-01-02 DIAGNOSIS — F3289 Other specified depressive episodes: Secondary | ICD-10-CM | POA: Diagnosis not present

## 2019-01-02 DIAGNOSIS — E039 Hypothyroidism, unspecified: Secondary | ICD-10-CM | POA: Diagnosis not present

## 2019-01-02 DIAGNOSIS — I69322 Dysarthria following cerebral infarction: Secondary | ICD-10-CM | POA: Diagnosis not present

## 2019-01-02 DIAGNOSIS — I083 Combined rheumatic disorders of mitral, aortic and tricuspid valves: Secondary | ICD-10-CM | POA: Diagnosis not present

## 2019-01-02 DIAGNOSIS — I69320 Aphasia following cerebral infarction: Secondary | ICD-10-CM | POA: Diagnosis not present

## 2019-01-02 DIAGNOSIS — R29810 Facial weakness: Secondary | ICD-10-CM | POA: Diagnosis not present

## 2019-01-03 ENCOUNTER — Ambulatory Visit: Payer: Medicare HMO | Admitting: Adult Health

## 2019-01-03 ENCOUNTER — Encounter: Payer: Self-pay | Admitting: Adult Health

## 2019-01-03 ENCOUNTER — Other Ambulatory Visit: Payer: Self-pay

## 2019-01-03 VITALS — BP 151/88 | HR 89 | Temp 97.2°F | Ht 63.0 in | Wt 126.8 lb

## 2019-01-03 DIAGNOSIS — I10 Essential (primary) hypertension: Secondary | ICD-10-CM | POA: Diagnosis not present

## 2019-01-03 DIAGNOSIS — E119 Type 2 diabetes mellitus without complications: Secondary | ICD-10-CM

## 2019-01-03 DIAGNOSIS — M1611 Unilateral primary osteoarthritis, right hip: Secondary | ICD-10-CM | POA: Diagnosis not present

## 2019-01-03 DIAGNOSIS — R2990 Unspecified symptoms and signs involving the nervous system: Secondary | ICD-10-CM | POA: Diagnosis not present

## 2019-01-03 DIAGNOSIS — E785 Hyperlipidemia, unspecified: Secondary | ICD-10-CM | POA: Diagnosis not present

## 2019-01-03 NOTE — Progress Notes (Signed)
[facility] [address] [place]. [place] [address] [phone]       HOSPITAL FOLLOW UP NOTE  Ms. Tannah Dreyfuss Date of Birth:  [birth year] Medical Record Number:  [id number]   Reason for Referral:  hospital stroke follow up    CHIEF COMPLAINT:  Chief Complaint  Patient presents with  . Cerebrovascular Accident    rm 9 Hospital FU, dgtr- [name], "getting therapy daily"    HPI: Bijou McPhersonis being seen today for in office hospital follow-up regarding stroke symptoms s/p tPA with left cortical TIA concerning for cardioembolic source on 12/01/2018.  History obtained from patient, daughter and chart review. Reviewed all radiology images and labs personally.  Ms.Tallia McPhersonis a 85 y.o.femalewith history of diabetes, hypothyroidism, hypertension, hyperlipidemiawho presented on 11/30/2018 with aphasia. Shereceived IV t-PA on Friday 11/30/2018 At 1415.  CT head showed chronic atrophic and ischemic changes without acute abnormality.  MRI no acute infarct but did show chronic CMB right thalamus or IC.  CTA head/neck negative for E LVO, no significant carotid or vertebral artery stenosis in the neck and no significant cranial stenosis.  Lower extremity venous Dopplers negative for DVT.  2D echo EF of 50 to 55%.  Loop recorder placed to assess for atrial fibrillation as cause of TIA.  LDL 86.  A1c 5.5.  Recommended DAPT for 3 weeks and aspirin alone.  HTN stable.  Recommended continuation of pravastatin 40 mg daily.  Controlled DM and ongoing follow-up with PCP.  Other stroke risk factors include 85, former tobacco use and prior EtOH use.  She was discharged home in stable condition with recommendation of home health therapy.  Ms. Taulbee is being seen today for hospital follow-up accompanied by her daughter.  She has been doing well since discharge with improvement of her cognition and ambulation.  She continues to receive home health PT mainly due  to right hip pain from arthritis.  She has received injections previously and may be receiving additional injections.  She continues to live with her daughter and is able to maintain ADLs independently.  Continues to use rolling walker while outside of the house but will use cane while inside.  Denies any falls.  Completed 3 weeks DAPT and continues on aspirin alone without bleeding or bruising.  Continues on pravastatin without myalgias.  Blood pressure today 147/93.  Does not routinely monitor at home but daughter plans on obtaining blood pressure cuff for ongoing monitoring.  Loop recorder has not shown atrial fibrillation thus far.  No further concerns at this time.     ROS:   14 system review of systems performed and negative with exception of joint pain  PMH:  Past Medical History:  Diagnosis Date  . Diabetes mellitus without complication (HCC)   . Hyperlipemia   . Hypertension   . Stroke [facility])     PSH:  Past Surgical History:  Procedure Laterality Date  . ESOPHAGOGASTRODUODENOSCOPY (EGD) WITH PROPOFOL N/A 05/20/2018   Procedure: ESOPHAGOGASTRODUODENOSCOPY (EGD) WITH PROPOFOL;  Surgeon: Kerin Salen, MD;  Location: [facility] ENDOSCOPY;  Service: Gastroenterology;  Laterality: N/A;  . GASTROSTOMY N/A 05/20/2018   Procedure: Insertion Of Gastrostomy Tube and Repair of Volvulus;  Surgeon: Violeta Gelinas, MD;  Location: [facility] OR;  Service: General;  Laterality: N/A;  . LAPAROTOMY N/A 05/20/2018   Procedure: EXPLORATORY LAPAROTOMY;  Surgeon: Violeta Gelinas, MD;  Location: [facility] OR;  Service: General;  Laterality: N/A;  . LOOP RECORDER IMPLANT  10/2018  . LOOP RECORDER INSERTION N/A 12/03/2018  Procedure: LOOP RECORDER INSERTION;  Surgeon: Marinus Maw, MD;  Location: [facility] INVASIVE CV LAB;  Service: Cardiovascular;  Laterality: N/A;    Social History:  Social History   Socioeconomic History  . Marital status: Widowed    Spouse name: Not on file  . Number of children: 3  . Years of education:  42  . Highest education level: Not on file  Occupational History    Comment: retired  Engineer, production  . Financial resource strain: Not on file  . Food insecurity    Worry: Not on file    Inability: Not on file  . Transportation needs    Medical: Not on file    Non-medical: Not on file  Tobacco Use  . Smoking status: Never Smoker  . Smokeless tobacco: Never Used  Substance and Sexual Activity  . Alcohol use: Not Currently    Frequency: Never  . Drug use: Never  . Sexual activity: Not on file  Lifestyle  . Physical activity    Days per week: Not on file    Minutes per session: Not on file  . Stress: Not on file  Relationships  . Social Musician on phone: Not on file    Gets together: Not on file    Attends religious service: Not on file    Active member of club or organization: Not on file    Attends meetings of clubs or organizations: Not on file    Relationship status: Not on file  . Intimate partner violence    Fear of current or ex partner: Not on file    Emotionally abused: Not on file    Physically abused: Not on file    Forced sexual activity: Not on file  Other Topics Concern  . Not on file  Social History Narrative   Lives with dgtr, Selena Batten   Caffeine- one daily    Family History:  Family History  Problem Relation Age of Onset  . Diabetes Mother   . Cancer Father     Medications:   Current Outpatient Medications on File Prior to Visit  Medication Sig Dispense Refill  . alendronate (FOSAMAX) 70 MG tablet Take 70 mg by mouth once a week.    Marland Kitchen aspirin EC 81 MG EC tablet Take 1 tablet (81 mg total) by mouth daily.    . cetirizine (ZYRTEC) 10 MG tablet Take 10 mg by mouth daily.    . feeding supplement, ENSURE ENLIVE, (ENSURE ENLIVE) LIQD Take 237 mLs by mouth 2 (two) times daily between meals. 237 mL 12  . levothyroxine (SYNTHROID, LEVOTHROID) 88 MCG tablet Take 88 mcg by mouth every morning.    Marland Kitchen losartan (COZAAR) 50 MG tablet Take 50 mg by mouth  daily.    . metFORMIN (GLUCOPHAGE) 500 MG tablet Take 1,000 mg by mouth 2 (two) times daily.    . Multiple Vitamin (MULTIVITAMIN WITH MINERALS) TABS tablet Take 1 tablet by mouth daily. 30 tablet 0  . polyethylene glycol (MIRALAX / GLYCOLAX) packet Take 17 g by mouth daily. 14 each 0  . pravastatin (PRAVACHOL) 40 MG tablet Take 40 mg by mouth daily.     No current facility-administered medications on file prior to visit.     Allergies:  No Known Allergies   Physical Exam  Vitals:   01/03/19 1255 01/03/19 1301  BP: (!) 147/93 (!) 151/88  Pulse: 92 89  Temp: (!) 97.2 F (36.2 C)   Weight: 126 lb 12.8 oz (  57.5 kg)   Height: 5\' 3"  (1.6 m)    Body mass index is 22.46 kg/m. No exam data present  [facility] 01/03/2019  Decreased Interest 0  Down, Depressed, Hopeless 0  PHQ - 2 Score 0     General: Frail very pleasant elderly African-American female, seated, in no evident distress Head: head normocephalic and atraumatic.   Neck: supple with no carotid or supraclavicular bruits Cardiovascular: regular rate and rhythm, no murmurs Musculoskeletal: no deformity Skin:  no rash/petichiae Vascular:  Normal pulses all extremities   Neurologic Exam Mental Status: Awake and fully alert. Oriented to place and time. Recent and remote memory intact. Attention span, concentration and fund of knowledge appropriate. Mood and affect appropriate.  Cranial Nerves: Fundoscopic exam reveals sharp disc margins. Pupils equal, briskly reactive to light. Extraocular movements full without nystagmus. Visual fields full to confrontation. Hearing intact. Facial sensation intact. Face, tongue, palate moves normally and symmetrically.  Motor: Normal bulk and tone. Normal strength in all tested extremity muscles except mildly decreased right hip flexor weakness due to arthritis.  Sensory.: intact to touch , pinprick , position and vibratory sensation.  Coordination: Rapid alternating movements  normal in all extremities. Finger-to-nose performed accurately bilaterally and heel-to-shin performed accurately on left side but unable to perform right side due to arthritis Gait and Station: Arises from chair without difficulty. Stance is slightly hunched. Gait demonstrates  short shuffled steps with assistance of rolling walker Reflexes: 1+ and symmetric. Toes downgoing.     NIHSS  0 Modified Rankin  0   Diagnostic Data (Labs, Imaging, Testing)  Ct Head Wo Contrast 11/30/2018 Chronic atrophic and ischemic changes without acute abnormality.   Ct Angio Head W Or Wo Contrast Ct Angio Neck W Or Wo Contrast 11/30/2018 No significant carotid or vertebral artery stenosis in the neck No significant cranial stenosis. Negative for large vessel occlusion.   MRI Brain Wo Contrast 12/01/2018 1. No acute intracranial abnormality. 2. Evidence of fairly advanced chronic small vessel disease, including a chronic micro-hemorrhage in the right lateral thalamus or internal capsule. 3. Degenerative cervical spinal stenosis at C3-C4 in the setting of spondylolisthesis. There may be associated degenerative spinal cord mass effect.  Transthoracic Echocardiogram 12/01/2018 1. Left ventricular ejection fraction, by visual estimation, is 50 to 55%. The left ventricle has normal function. There is moderately increased left ventricular hypertrophy. 2. Left ventricular diastolic Doppler parameters are consistent with pseudonormalization pattern of LV diastolic filling. 3. Global right ventricle has normal systolic function.The right ventricular size is normal. No increase in right ventricular wall thickness. 4. Left atrial size was normal. 5. Right atrial size was normal. 6. Mild aortic valve annular calcification. 7. The mitral valve is normal in structure. Mild mitral valve regurgitation. 8. The tricuspid valve is normal in structure. Tricuspid valve regurgitation is trivial. 9. The aortic  valve is normal in structure. Aortic valve regurgitation is mild to moderate by color flow Doppler. 10. The pulmonic valve was grossly normal. Pulmonic valve regurgitation is not visualized by color flow Doppler. 11. Normal pulmonary artery systolic pressure. 12. The inferior vena cava is normal in size with greater than 50% respiratory variability, suggesting right atrial pressure of 3 mmHg. 13. The atrial septum is grossly normal.  2D Echocardiogram 12/01/2018 1. Left ventricular ejection fraction, by visual estimation, is 50 to 55%. The left ventricle has normal function. There is moderately increased left ventricular hypertrophy. 2. Left ventricular diastolic Doppler parameters are consistent with pseudonormalization pattern of LV  diastolic filling. 3. Global right ventricle has normal systolic function.The right ventricular size is normal. No increase in right ventricular wall thickness. 4. Left atrial size was normal. 5. Right atrial size was normal. 6. Mild aortic valve annular calcification. 7. The mitral valve is normal in structure. Mild mitral valve regurgitation. 8. The tricuspid valve is normal in structure. Tricuspid valve regurgitation is trivial. 9. The aortic valve is normal in structure. Aortic valve regurgitation is mild to moderate by color flow Doppler. 10. The pulmonic valve was grossly normal. Pulmonic valve regurgitation is not visualized by color flow Doppler. 11. Normal pulmonary artery systolic pressure. 12. The inferior vena cava is normal in size with greater than 50% respiratory variability, suggesting right atrial pressure of 3 mmHg. 13. The atrial septum is grossly normal.  Bilateral Lower Extremity Venous Dopplers 12/01/2018 Right: There is no evidence of deep vein thrombosis in the lower extremity. Left: There is no evidence of deep vein thrombosis in the lower extremity.  ECG - SR rate 85 BPM. (See cardiology reading for complete details)    ASSESSMENT: Alessandria Henken is a 85 y.o. year old female presented with transient episodes of aphasia on 12/01/2018 therefore received tPA and likely left cortical TIA s/p TPA concerning for cardioembolic source therefore loop recorder placed to assess for atrial fibrillation.  Vascular risk factors include HTN, HLD and DM.  Has been doing well since hospital discharge without recurring or new stroke/TIA symptoms.    PLAN:  1. TIA: Continue aspirin 81 mg daily  and pravastatin for secondary stroke prevention.  Continue to monitor loop recorder for atrial fibrillation.  Maintain strict control of hypertension with blood pressure goal below 130/90, diabetes with hemoglobin A1c goal below 6.5% and cholesterol with LDL cholesterol (bad cholesterol) goal below 70 mg/dL.  I also advised the patient to eat a healthy diet with plenty of whole grains, cereals, fruits and vegetables, exercise regularly with at least 30 minutes of continuous activity daily and maintain ideal body weight. 2. HTN: Advised to continue current treatment regimen.  Will encourage obtaining blood pressure cuff for home monitoring and ongoing follow-up with PCP 3. HLD: Advised to continue current treatment regimen along with continued follow-up with PCP for future prescribing and monitoring of lipid panel 4. DMII: Advised to continue to monitor glucose levels at home along with continued follow-up with PCP for management and monitoring 5. Right hip arthritis: Continuation of home health PT and possible transition to aquatic therapy once completed as right hip pain greatly limits daily mobility and functioning    Follow up in 4 months or call earlier if needed   Greater than 50% of time during this 45 minute visit was spent on counseling, explanation of diagnosis of TIA, reviewing risk factor management of HTN, HLD and DM, discussion regarding loop recorder, planning of further management along with potential future management, and  discussion with patient and family answering all questions.    Frann Rider, AGNP-BC  [facility] [address] [place], [place] [address]  Phone [phone] Fax [phone] Note: This document was prepared with digital dictation and possible smart phrase technology. Any transcriptional errors that result from this process are unintentional.

## 2019-01-03 NOTE — Patient Instructions (Signed)
Continue home health therapies and consider aquatic therapy for improvement of hip pain  Continue aspirin 81 mg daily  and pravastatin for secondary stroke prevention  Continue to follow up with PCP regarding cholesterol, blood pressure and diabetes management   Highly recommend monitoring blood pressure at home with recording of levels  We will continue to monitor your loop recorder and you will be notified with any findings  Maintain strict control of hypertension with blood pressure goal below 130/90, diabetes with hemoglobin A1c goal below 6.5% and cholesterol with LDL cholesterol (bad cholesterol) goal below 70 mg/dL. I also advised the patient to eat a healthy diet with plenty of whole grains, cereals, fruits and vegetables, exercise regularly and maintain ideal body weight.  Followup in the future with me in 4 months or call earlier if needed       Thank you for coming to see [place] at [facility]. I hope we have been able to provide you high quality care today.  You may receive a patient satisfaction survey over the next few weeks. We would appreciate your feedback and comments so that we may continue to improve ourselves and the health of our patients.

## 2019-01-04 DIAGNOSIS — I69320 Aphasia following cerebral infarction: Secondary | ICD-10-CM | POA: Diagnosis not present

## 2019-01-04 DIAGNOSIS — E039 Hypothyroidism, unspecified: Secondary | ICD-10-CM | POA: Diagnosis not present

## 2019-01-04 DIAGNOSIS — I10 Essential (primary) hypertension: Secondary | ICD-10-CM | POA: Diagnosis not present

## 2019-01-04 DIAGNOSIS — R29810 Facial weakness: Secondary | ICD-10-CM | POA: Diagnosis not present

## 2019-01-04 DIAGNOSIS — M810 Age-related osteoporosis without current pathological fracture: Secondary | ICD-10-CM | POA: Diagnosis not present

## 2019-01-04 DIAGNOSIS — E440 Moderate protein-calorie malnutrition: Secondary | ICD-10-CM | POA: Diagnosis not present

## 2019-01-04 DIAGNOSIS — I69322 Dysarthria following cerebral infarction: Secondary | ICD-10-CM | POA: Diagnosis not present

## 2019-01-04 DIAGNOSIS — I083 Combined rheumatic disorders of mitral, aortic and tricuspid valves: Secondary | ICD-10-CM | POA: Diagnosis not present

## 2019-01-04 DIAGNOSIS — F3289 Other specified depressive episodes: Secondary | ICD-10-CM | POA: Diagnosis not present

## 2019-01-07 DIAGNOSIS — I083 Combined rheumatic disorders of mitral, aortic and tricuspid valves: Secondary | ICD-10-CM | POA: Diagnosis not present

## 2019-01-07 DIAGNOSIS — F3289 Other specified depressive episodes: Secondary | ICD-10-CM | POA: Diagnosis not present

## 2019-01-07 DIAGNOSIS — M810 Age-related osteoporosis without current pathological fracture: Secondary | ICD-10-CM | POA: Diagnosis not present

## 2019-01-07 DIAGNOSIS — I69320 Aphasia following cerebral infarction: Secondary | ICD-10-CM | POA: Diagnosis not present

## 2019-01-07 DIAGNOSIS — I10 Essential (primary) hypertension: Secondary | ICD-10-CM | POA: Diagnosis not present

## 2019-01-07 DIAGNOSIS — E039 Hypothyroidism, unspecified: Secondary | ICD-10-CM | POA: Diagnosis not present

## 2019-01-07 DIAGNOSIS — R29810 Facial weakness: Secondary | ICD-10-CM | POA: Diagnosis not present

## 2019-01-07 DIAGNOSIS — E440 Moderate protein-calorie malnutrition: Secondary | ICD-10-CM | POA: Diagnosis not present

## 2019-01-07 DIAGNOSIS — I69322 Dysarthria following cerebral infarction: Secondary | ICD-10-CM | POA: Diagnosis not present

## 2019-01-07 NOTE — Progress Notes (Signed)
I agree with the above plan 

## 2019-01-10 ENCOUNTER — Ambulatory Visit (INDEPENDENT_AMBULATORY_CARE_PROVIDER_SITE_OTHER): Payer: Medicare HMO | Admitting: *Deleted

## 2019-01-10 DIAGNOSIS — R29810 Facial weakness: Secondary | ICD-10-CM | POA: Diagnosis not present

## 2019-01-10 DIAGNOSIS — R2990 Unspecified symptoms and signs involving the nervous system: Secondary | ICD-10-CM

## 2019-01-10 DIAGNOSIS — I69320 Aphasia following cerebral infarction: Secondary | ICD-10-CM | POA: Diagnosis not present

## 2019-01-10 DIAGNOSIS — E039 Hypothyroidism, unspecified: Secondary | ICD-10-CM | POA: Diagnosis not present

## 2019-01-10 DIAGNOSIS — I083 Combined rheumatic disorders of mitral, aortic and tricuspid valves: Secondary | ICD-10-CM | POA: Diagnosis not present

## 2019-01-10 DIAGNOSIS — E440 Moderate protein-calorie malnutrition: Secondary | ICD-10-CM | POA: Diagnosis not present

## 2019-01-10 DIAGNOSIS — I69322 Dysarthria following cerebral infarction: Secondary | ICD-10-CM | POA: Diagnosis not present

## 2019-01-10 DIAGNOSIS — F3289 Other specified depressive episodes: Secondary | ICD-10-CM | POA: Diagnosis not present

## 2019-01-10 DIAGNOSIS — I10 Essential (primary) hypertension: Secondary | ICD-10-CM | POA: Diagnosis not present

## 2019-01-10 DIAGNOSIS — M810 Age-related osteoporosis without current pathological fracture: Secondary | ICD-10-CM | POA: Diagnosis not present

## 2019-01-10 LAB — CUP PACEART REMOTE DEVICE CHECK
Date Time Interrogation Session: 20201112211621
Implantable Pulse Generator Implant Date: 20201005

## 2019-01-15 DIAGNOSIS — E7800 Pure hypercholesterolemia, unspecified: Secondary | ICD-10-CM | POA: Diagnosis not present

## 2019-01-15 DIAGNOSIS — E039 Hypothyroidism, unspecified: Secondary | ICD-10-CM | POA: Diagnosis not present

## 2019-01-15 DIAGNOSIS — I10 Essential (primary) hypertension: Secondary | ICD-10-CM | POA: Diagnosis not present

## 2019-01-15 DIAGNOSIS — M810 Age-related osteoporosis without current pathological fracture: Secondary | ICD-10-CM | POA: Diagnosis not present

## 2019-01-15 DIAGNOSIS — M25559 Pain in unspecified hip: Secondary | ICD-10-CM | POA: Diagnosis not present

## 2019-01-15 DIAGNOSIS — E1169 Type 2 diabetes mellitus with other specified complication: Secondary | ICD-10-CM | POA: Diagnosis not present

## 2019-01-16 DIAGNOSIS — I69322 Dysarthria following cerebral infarction: Secondary | ICD-10-CM | POA: Diagnosis not present

## 2019-01-16 DIAGNOSIS — F3289 Other specified depressive episodes: Secondary | ICD-10-CM | POA: Diagnosis not present

## 2019-01-16 DIAGNOSIS — E039 Hypothyroidism, unspecified: Secondary | ICD-10-CM | POA: Diagnosis not present

## 2019-01-16 DIAGNOSIS — I69320 Aphasia following cerebral infarction: Secondary | ICD-10-CM | POA: Diagnosis not present

## 2019-01-16 DIAGNOSIS — I10 Essential (primary) hypertension: Secondary | ICD-10-CM | POA: Diagnosis not present

## 2019-01-16 DIAGNOSIS — R29810 Facial weakness: Secondary | ICD-10-CM | POA: Diagnosis not present

## 2019-01-16 DIAGNOSIS — E440 Moderate protein-calorie malnutrition: Secondary | ICD-10-CM | POA: Diagnosis not present

## 2019-01-16 DIAGNOSIS — I083 Combined rheumatic disorders of mitral, aortic and tricuspid valves: Secondary | ICD-10-CM | POA: Diagnosis not present

## 2019-01-16 DIAGNOSIS — M810 Age-related osteoporosis without current pathological fracture: Secondary | ICD-10-CM | POA: Diagnosis not present

## 2019-01-18 DIAGNOSIS — M1611 Unilateral primary osteoarthritis, right hip: Secondary | ICD-10-CM | POA: Diagnosis not present

## 2019-01-23 DIAGNOSIS — E440 Moderate protein-calorie malnutrition: Secondary | ICD-10-CM | POA: Diagnosis not present

## 2019-01-23 DIAGNOSIS — E039 Hypothyroidism, unspecified: Secondary | ICD-10-CM | POA: Diagnosis not present

## 2019-01-23 DIAGNOSIS — I10 Essential (primary) hypertension: Secondary | ICD-10-CM | POA: Diagnosis not present

## 2019-01-23 DIAGNOSIS — F3289 Other specified depressive episodes: Secondary | ICD-10-CM | POA: Diagnosis not present

## 2019-01-23 DIAGNOSIS — R29810 Facial weakness: Secondary | ICD-10-CM | POA: Diagnosis not present

## 2019-01-23 DIAGNOSIS — I69320 Aphasia following cerebral infarction: Secondary | ICD-10-CM | POA: Diagnosis not present

## 2019-01-23 DIAGNOSIS — M810 Age-related osteoporosis without current pathological fracture: Secondary | ICD-10-CM | POA: Diagnosis not present

## 2019-01-23 DIAGNOSIS — I083 Combined rheumatic disorders of mitral, aortic and tricuspid valves: Secondary | ICD-10-CM | POA: Diagnosis not present

## 2019-01-23 DIAGNOSIS — I69322 Dysarthria following cerebral infarction: Secondary | ICD-10-CM | POA: Diagnosis not present

## 2019-01-28 DIAGNOSIS — I69320 Aphasia following cerebral infarction: Secondary | ICD-10-CM | POA: Diagnosis not present

## 2019-01-28 DIAGNOSIS — M810 Age-related osteoporosis without current pathological fracture: Secondary | ICD-10-CM | POA: Diagnosis not present

## 2019-01-28 DIAGNOSIS — E440 Moderate protein-calorie malnutrition: Secondary | ICD-10-CM | POA: Diagnosis not present

## 2019-01-28 DIAGNOSIS — F3289 Other specified depressive episodes: Secondary | ICD-10-CM | POA: Diagnosis not present

## 2019-01-28 DIAGNOSIS — I10 Essential (primary) hypertension: Secondary | ICD-10-CM | POA: Diagnosis not present

## 2019-01-28 DIAGNOSIS — R29810 Facial weakness: Secondary | ICD-10-CM | POA: Diagnosis not present

## 2019-01-28 DIAGNOSIS — I083 Combined rheumatic disorders of mitral, aortic and tricuspid valves: Secondary | ICD-10-CM | POA: Diagnosis not present

## 2019-01-28 DIAGNOSIS — E039 Hypothyroidism, unspecified: Secondary | ICD-10-CM | POA: Diagnosis not present

## 2019-01-28 DIAGNOSIS — I69322 Dysarthria following cerebral infarction: Secondary | ICD-10-CM | POA: Diagnosis not present

## 2019-02-01 NOTE — Progress Notes (Signed)
Carelink Summary Report / Loop Recorder 

## 2019-02-08 ENCOUNTER — Ambulatory Visit (INDEPENDENT_AMBULATORY_CARE_PROVIDER_SITE_OTHER): Payer: Medicare HMO | Admitting: *Deleted

## 2019-02-08 DIAGNOSIS — R2990 Unspecified symptoms and signs involving the nervous system: Secondary | ICD-10-CM | POA: Diagnosis not present

## 2019-02-11 LAB — CUP PACEART REMOTE DEVICE CHECK
Date Time Interrogation Session: 20201211132950
Implantable Pulse Generator Implant Date: 20201005

## 2019-03-07 ENCOUNTER — Other Ambulatory Visit: Payer: Self-pay

## 2019-03-07 NOTE — Patient Outreach (Signed)
First telephone outreach attempt to obtain mRs. No answer. Left message for returned call.   Rinoa Garramone Care Management Assistant  

## 2019-03-13 ENCOUNTER — Ambulatory Visit (INDEPENDENT_AMBULATORY_CARE_PROVIDER_SITE_OTHER): Payer: Medicare HMO | Admitting: *Deleted

## 2019-03-13 DIAGNOSIS — R2990 Unspecified symptoms and signs involving the nervous system: Secondary | ICD-10-CM | POA: Diagnosis not present

## 2019-03-13 LAB — CUP PACEART REMOTE DEVICE CHECK
Date Time Interrogation Session: 20210113133127
Implantable Pulse Generator Implant Date: 20201005

## 2019-03-14 ENCOUNTER — Other Ambulatory Visit: Payer: Self-pay

## 2019-03-14 NOTE — Patient Outreach (Signed)
Telephone outreach to patient to obtain mRS was successfully completed. MRS=0   Dana Saunders Care Management Assistant  

## 2019-04-15 ENCOUNTER — Ambulatory Visit (INDEPENDENT_AMBULATORY_CARE_PROVIDER_SITE_OTHER): Payer: Medicare HMO | Admitting: *Deleted

## 2019-04-15 DIAGNOSIS — R2990 Unspecified symptoms and signs involving the nervous system: Secondary | ICD-10-CM | POA: Diagnosis not present

## 2019-04-15 LAB — CUP PACEART REMOTE DEVICE CHECK
Date Time Interrogation Session: 20210214230214
Implantable Pulse Generator Implant Date: 20201005

## 2019-04-16 NOTE — Progress Notes (Signed)
ILR Remote 

## 2019-04-19 DIAGNOSIS — G459 Transient cerebral ischemic attack, unspecified: Secondary | ICD-10-CM | POA: Diagnosis not present

## 2019-04-19 DIAGNOSIS — M25559 Pain in unspecified hip: Secondary | ICD-10-CM | POA: Diagnosis not present

## 2019-04-19 DIAGNOSIS — E039 Hypothyroidism, unspecified: Secondary | ICD-10-CM | POA: Diagnosis not present

## 2019-04-19 DIAGNOSIS — I700 Atherosclerosis of aorta: Secondary | ICD-10-CM | POA: Diagnosis not present

## 2019-04-19 DIAGNOSIS — E7800 Pure hypercholesterolemia, unspecified: Secondary | ICD-10-CM | POA: Diagnosis not present

## 2019-04-19 DIAGNOSIS — E1169 Type 2 diabetes mellitus with other specified complication: Secondary | ICD-10-CM | POA: Diagnosis not present

## 2019-04-19 DIAGNOSIS — M810 Age-related osteoporosis without current pathological fracture: Secondary | ICD-10-CM | POA: Diagnosis not present

## 2019-04-19 DIAGNOSIS — I10 Essential (primary) hypertension: Secondary | ICD-10-CM | POA: Diagnosis not present

## 2019-05-06 ENCOUNTER — Ambulatory Visit: Payer: Medicare HMO | Admitting: Adult Health

## 2019-05-06 ENCOUNTER — Other Ambulatory Visit: Payer: Self-pay

## 2019-05-06 ENCOUNTER — Encounter: Payer: Self-pay | Admitting: Adult Health

## 2019-05-06 VITALS — BP 138/88 | HR 94 | Temp 97.7°F | Ht 63.5 in | Wt 127.8 lb

## 2019-05-06 DIAGNOSIS — I10 Essential (primary) hypertension: Secondary | ICD-10-CM

## 2019-05-06 DIAGNOSIS — R2990 Unspecified symptoms and signs involving the nervous system: Secondary | ICD-10-CM

## 2019-05-06 DIAGNOSIS — E785 Hyperlipidemia, unspecified: Secondary | ICD-10-CM | POA: Diagnosis not present

## 2019-05-06 DIAGNOSIS — E119 Type 2 diabetes mellitus without complications: Secondary | ICD-10-CM | POA: Diagnosis not present

## 2019-05-06 NOTE — Progress Notes (Signed)
[facility] [address] [place]. [place] [address] [phone]       HOSPITAL FOLLOW UP NOTE  [name] Date of Birth:  Nov 30, 1930 Medical Record Number:  [id number]   Reason for Referral:  hospital stroke follow up    CHIEF COMPLAINT:  Chief Complaint  Patient presents with  . Follow-up    RM9. with niece. 4 month f/u No questions. No concernc.    HPI:  Dana Saunders is a 86 year old female who is being seen today for stroke follow-up accompanied by her niece.  She has been doing well since prior visit without reoccurring or new stroke/TIA symptoms.  Loop recorder is not shown atrial fibrillation thus far.  Continues on aspirin and pravastatin for secondary stroke prevention without side effects.  Blood pressure today 138/88.  Only concern at today's visit is in regards to chronic right hip pain and continues to follow with orthopedics.  No further concerns at this time.   History copied for reference purposes only Stroke admission 12/01/2018: Dana Saunders a 86 y.o.femalewith history of diabetes, hypothyroidism, hypertension, hyperlipidemiawho presented on 11/30/2018 with aphasia. Shereceived IV t-PA on Friday 11/30/2018 At 1415.  CT head showed chronic atrophic and ischemic changes without acute abnormality.  MRI no acute infarct but did show chronic CMB right thalamus or IC.  CTA head/neck negative for E LVO, no significant carotid or vertebral artery stenosis in the neck and no significant cranial stenosis.  Lower extremity venous Dopplers negative for DVT.  2D echo EF of 50 to 55%.  Loop recorder placed to assess for atrial fibrillation as cause of TIA.  LDL 86.  A1c 5.5.  Recommended DAPT for 3 weeks and aspirin alone.  HTN stable.  Recommended continuation of pravastatin 40 mg daily.  Controlled DM and ongoing follow-up with PCP.  Other stroke risk factors include advanced age, former tobacco use and prior EtOH use.  She was discharged  home in stable condition with recommendation of home health therapy.  Initial visit 01/03/2019: Dana Saunders is being seen today for hospital follow-up accompanied by her daughter.  She has been doing well since discharge with improvement of her cognition and ambulation.  She continues to receive home health PT mainly due to right hip pain from arthritis.  She has received injections previously and may be receiving additional injections.  She continues to live with her daughter and is able to maintain ADLs independently.  Continues to use rolling walker while outside of the house but will use cane while inside.  Denies any falls.  Completed 3 weeks DAPT and continues on aspirin alone without bleeding or bruising.  Continues on pravastatin without myalgias.  Blood pressure today 147/93.  Does not routinely monitor at home but daughter plans on obtaining blood pressure cuff for ongoing monitoring.  Loop recorder has not shown atrial fibrillation thus far.  No further concerns at this time.     ROS:   14 system review of systems performed and negative with exception of joint pain  PMH:  Past Medical History:  Diagnosis Date  . Diabetes mellitus without complication (HCC)   . Hyperlipemia   . Hypertension   . Stroke [facility])     PSH:  Past Surgical History:  Procedure Laterality Date  . ESOPHAGOGASTRODUODENOSCOPY (EGD) WITH PROPOFOL N/A 05/20/2018   Procedure: ESOPHAGOGASTRODUODENOSCOPY (EGD) WITH PROPOFOL;  Surgeon: Kerin Salen, MD;  Location: [facility] ENDOSCOPY;  Service: Gastroenterology;  Laterality: N/A;  . GASTROSTOMY N/A 05/20/2018   Procedure: Insertion Of  Gastrostomy Tube and Repair of Volvulus;  Surgeon: Georganna Skeans, MD;  Location: [place];  Service: General;  Laterality: N/A;  . LAPAROTOMY N/A 05/20/2018   Procedure: EXPLORATORY LAPAROTOMY;  Surgeon: Georganna Skeans, MD;  Location: [place];  Service: General;  Laterality: N/A;  . LOOP RECORDER IMPLANT  10/2018  . LOOP RECORDER INSERTION N/A  12/03/2018   Procedure: LOOP RECORDER INSERTION;  Surgeon: Evans Lance, MD;  Location: [place] CV LAB;  Service: Cardiovascular;  Laterality: N/A;    Social History:  Social History   Socioeconomic History  . Marital status: Widowed    Spouse name: Not on file  . Number of children: 3  . Years of education: 60  . Highest education level: Not on file  Occupational History    Comment: retired  Tobacco Use  . Smoking status: Never Smoker  . Smokeless tobacco: Never Used  Substance and Sexual Activity  . Alcohol use: Not Currently  . Drug use: Never  . Sexual activity: Not on file  Other Topics Concern  . Not on file  Social History Narrative   Lives with dgtr, Maudie Mercury   Caffeine- one daily   Social Determinants of Health   Financial Resource Strain:   . Difficulty of Paying Living Expenses: Not on file  Food Insecurity:   . Worried About Charity fundraiser in the Last Year: Not on file  . Ran Out of Food in the Last Year: Not on file  Transportation Needs:   . Lack of Transportation (Medical): Not on file  . Lack of Transportation (Non-Medical): Not on file  Physical Activity:   . Days of Exercise per Week: Not on file  . Minutes of Exercise per Session: Not on file  Stress:   . Feeling of Stress : Not on file  Social Connections:   . Frequency of Communication with Friends and Family: Not on file  . Frequency of Social Gatherings with Friends and Family: Not on file  . Attends Religious Services: Not on file  . Active Member of Clubs or Organizations: Not on file  . Attends Archivist Meetings: Not on file  . Marital Status: Not on file  Intimate Partner Violence:   . Fear of Current or Ex-Partner: Not on file  . Emotionally Abused: Not on file  . Physically Abused: Not on file  . Sexually Abused: Not on file    Family History:  Family History  Problem Relation Age of Onset  . Diabetes Mother   . Cancer Father     Medications:   Current  Outpatient Medications on File Prior to Visit  Medication Sig Dispense Refill  . alendronate (FOSAMAX) 70 MG tablet Take 70 mg by mouth once a week. Friday    . aspirin EC 81 MG EC tablet Take 1 tablet (81 mg total) by mouth daily.    . cetirizine (ZYRTEC) 10 MG tablet Take 10 mg by mouth daily.    . feeding supplement, ENSURE ENLIVE, (ENSURE ENLIVE) LIQD Take 237 mLs by mouth 2 (two) times daily between meals. 237 mL 12  . levothyroxine (SYNTHROID, LEVOTHROID) 88 MCG tablet Take 88 mcg by mouth every morning.    Marland Kitchen losartan (COZAAR) 50 MG tablet Take 50 mg by mouth daily.    . metFORMIN (GLUCOPHAGE) 500 MG tablet Take 1,000 mg by mouth 2 (two) times daily.    . Multiple Vitamin (MULTIVITAMIN WITH MINERALS) TABS tablet Take 1 tablet by mouth daily. (Patient taking differently:  Take 1 tablet by mouth daily. 1x daily) 30 tablet 0  . polyethylene glycol (MIRALAX / GLYCOLAX) packet Take 17 g by mouth daily. 14 each 0  . pravastatin (PRAVACHOL) 40 MG tablet Take 40 mg by mouth daily.     No current facility-administered medications on file prior to visit.    Allergies:  No Known Allergies   Physical Exam  Vitals:   05/06/19 1345  BP: 138/88  Pulse: 94  Temp: 97.7 F (36.5 C)  SpO2: 98%  Weight: 127 lb 12.8 oz (58 kg)  Height: 5' 3.5" (1.613 m)   Body mass index is 22.28 kg/m. No exam data present   General: Frail very pleasant elderly African-American female, seated, in no evident distress Head: head normocephalic and atraumatic.   Neck: supple with no carotid or supraclavicular bruits Cardiovascular: regular rate and rhythm, no murmurs Musculoskeletal: no deformity Skin:  no rash/petichiae Vascular:  Normal pulses all extremities   Neurologic Exam Mental Status: Awake and fully alert.   Normal speech and language.  Oriented to place and time. Recent and remote memory intact. Attention span, concentration and fund of knowledge appropriate. Mood and affect appropriate.  Cranial  Nerves: Pupils equal, briskly reactive to light. Extraocular movements full without nystagmus. Visual fields full to confrontation. Hearing intact. Facial sensation intact. Face, tongue, palate moves normally and symmetrically.  Motor: Normal bulk and tone. Normal strength in all tested extremity muscles except mildly decreased right hip flexor weakness due to pain Sensory.: intact to touch , pinprick , position and vibratory sensation.  Coordination: Rapid alternating movements normal in all extremities. Finger-to-nose performed accurately bilaterally and heel-to-shin performed accurately on left side but unable to perform right side due to pain Gait and Station: Arises from chair without difficulty. Stance is slightly hunched. Gait demonstrates  short shuffled steps with assistance of Rollator walker Reflexes: 1+ and symmetric. Toes downgoing.      Diagnostic Data (Labs, Imaging, Testing)  Ct Head Wo Contrast 11/30/2018 Chronic atrophic and ischemic changes without acute abnormality.   Ct Angio Head W Or Wo Contrast Ct Angio Neck W Or Wo Contrast 11/30/2018 No significant carotid or vertebral artery stenosis in the neck No significant cranial stenosis. Negative for large vessel occlusion.   MRI Brain Wo Contrast 12/01/2018 1. No acute intracranial abnormality. 2. Evidence of fairly advanced chronic small vessel disease, including a chronic micro-hemorrhage in the right lateral thalamus or internal capsule. 3. Degenerative cervical spinal stenosis at C3-C4 in the setting of spondylolisthesis. There may be associated degenerative spinal cord mass effect.  Transthoracic Echocardiogram 12/01/2018 1. Left ventricular ejection fraction, by visual estimation, is 50 to 55%. The left ventricle has normal function. There is moderately increased left ventricular hypertrophy. 2. Left ventricular diastolic Doppler parameters are consistent with pseudonormalization pattern of LV diastolic  filling. 3. Global right ventricle has normal systolic function.The right ventricular size is normal. No increase in right ventricular wall thickness. 4. Left atrial size was normal. 5. Right atrial size was normal. 6. Mild aortic valve annular calcification. 7. The mitral valve is normal in structure. Mild mitral valve regurgitation. 8. The tricuspid valve is normal in structure. Tricuspid valve regurgitation is trivial. 9. The aortic valve is normal in structure. Aortic valve regurgitation is mild to moderate by color flow Doppler. 10. The pulmonic valve was grossly normal. Pulmonic valve regurgitation is not visualized by color flow Doppler. 11. Normal pulmonary artery systolic pressure. 12. The inferior vena cava is normal in size with greater  than 50% respiratory variability, suggesting right atrial pressure of 3 mmHg. 13. The atrial septum is grossly normal.  2D Echocardiogram 12/01/2018 1. Left ventricular ejection fraction, by visual estimation, is 50 to 55%. The left ventricle has normal function. There is moderately increased left ventricular hypertrophy. 2. Left ventricular diastolic Doppler parameters are consistent with pseudonormalization pattern of LV diastolic filling. 3. Global right ventricle has normal systolic function.The right ventricular size is normal. No increase in right ventricular wall thickness. 4. Left atrial size was normal. 5. Right atrial size was normal. 6. Mild aortic valve annular calcification. 7. The mitral valve is normal in structure. Mild mitral valve regurgitation. 8. The tricuspid valve is normal in structure. Tricuspid valve regurgitation is trivial. 9. The aortic valve is normal in structure. Aortic valve regurgitation is mild to moderate by color flow Doppler. 10. The pulmonic valve was grossly normal. Pulmonic valve regurgitation is not visualized by color flow Doppler. 11. Normal pulmonary artery systolic pressure. 12. The  inferior vena cava is normal in size with greater than 50% respiratory variability, suggesting right atrial pressure of 3 mmHg. 13. The atrial septum is grossly normal.  Bilateral Lower Extremity Venous Dopplers 12/01/2018 Right: There is no evidence of deep vein thrombosis in the lower extremity. Left: There is no evidence of deep vein thrombosis in the lower extremity.  ECG - SR rate 85 BPM. (See cardiology reading for complete details)   ASSESSMENT: Dana Saunders is a 86 y.o. year old female presented with transient episodes of aphasia on 12/01/2018 therefore received tPA and likely left cortical TIA s/p TPA concerning for cardioembolic source therefore loop recorder placed to assess for atrial fibrillation.  Vascular risk factors include HTN, HLD and DM.  Loop recorder is not shown atrial fibrillation thus far.    PLAN:  1. TIA it has: Continue aspirin 81 mg daily  and pravastatin for secondary stroke prevention.  Continue to monitor loop recorder for atrial fibrillation.  Maintain strict control of hypertension with blood pressure goal below 130/90, diabetes with hemoglobin A1c goal below 6.5% and cholesterol with LDL cholesterol (bad cholesterol) goal below 70 mg/dL.  I also advised the patient to eat a healthy diet with plenty of whole grains, cereals, fruits and vegetables, exercise regularly with at least 30 minutes of continuous activity daily and maintain ideal body weight. 2. HTN: Advised to continue current treatment regimen.  Will encourage obtaining blood pressure cuff for home monitoring and ongoing follow-up with PCP 3. HLD: Advised to continue current treatment regimen along with continued follow-up with PCP for future prescribing and monitoring of lipid panel 4. DMII: Advised to continue to monitor glucose levels at home along with continued follow-up with PCP for management and monitoring    Follow up in 6 months or call earlier if needed   I spent 20 minutes of  face-to-face and non-face-to-face time with patient.  This included previsit chart review, lab review, study review, order entry, electronic health record documentation, patient education   Ihor Austin, [facility]  [facility] [address] [place], [place] [address]  Phone [phone] Fax [phone] Note: This document was prepared with digital dictation and possible smart phrase technology. Any transcriptional errors that result from this process are unintentional.

## 2019-05-06 NOTE — Patient Instructions (Signed)
Continue aspirin 81 mg daily  and pravastatin for secondary stroke prevention  Continue to follow up with PCP regarding cholesterol, blood pressure and diabetes management   Continue to monitor blood pressure at home  Continue to monitor loop recorder for possible atrial fibrillation  Maintain strict control of hypertension with blood pressure goal below 130/90, diabetes with hemoglobin A1c goal below 6.5% and cholesterol with LDL cholesterol (bad cholesterol) goal below 70 mg/dL. I also advised the patient to eat a healthy diet with plenty of whole grains, cereals, fruits and vegetables, exercise regularly and maintain ideal body weight.  Followup in the future with me in 6 months or call earlier if needed       Thank you for coming to see [place] at [facility]. I hope we have been able to provide you high quality care today.  You may receive a patient satisfaction survey over the next few weeks. We would appreciate your feedback and comments so that we may continue to improve ourselves and the health of our patients.

## 2019-05-06 NOTE — Progress Notes (Signed)
I agree with the above plan 

## 2019-05-16 ENCOUNTER — Ambulatory Visit (INDEPENDENT_AMBULATORY_CARE_PROVIDER_SITE_OTHER): Payer: Medicare HMO | Admitting: *Deleted

## 2019-05-16 DIAGNOSIS — R2990 Unspecified symptoms and signs involving the nervous system: Secondary | ICD-10-CM | POA: Diagnosis not present

## 2019-05-16 LAB — CUP PACEART REMOTE DEVICE CHECK
Date Time Interrogation Session: 20210317230542
Implantable Pulse Generator Implant Date: 20201005

## 2019-05-17 NOTE — Progress Notes (Signed)
ILR Remote 

## 2019-06-16 LAB — CUP PACEART REMOTE DEVICE CHECK
Date Time Interrogation Session: 20210417230340
Implantable Pulse Generator Implant Date: 20201005

## 2019-06-17 ENCOUNTER — Ambulatory Visit (INDEPENDENT_AMBULATORY_CARE_PROVIDER_SITE_OTHER): Payer: Medicare HMO | Admitting: *Deleted

## 2019-06-17 DIAGNOSIS — R2990 Unspecified symptoms and signs involving the nervous system: Secondary | ICD-10-CM | POA: Diagnosis not present

## 2019-06-18 NOTE — Progress Notes (Signed)
ILR Remote 

## 2019-07-17 LAB — CUP PACEART REMOTE DEVICE CHECK
Date Time Interrogation Session: 20210518230607
Implantable Pulse Generator Implant Date: 20201005

## 2019-07-22 ENCOUNTER — Ambulatory Visit (INDEPENDENT_AMBULATORY_CARE_PROVIDER_SITE_OTHER): Payer: Medicare HMO | Admitting: *Deleted

## 2019-07-22 DIAGNOSIS — R2990 Unspecified symptoms and signs involving the nervous system: Secondary | ICD-10-CM | POA: Diagnosis not present

## 2019-07-23 NOTE — Progress Notes (Signed)
Carelink Summary Report / Loop Recorder 

## 2019-08-14 DIAGNOSIS — M1611 Unilateral primary osteoarthritis, right hip: Secondary | ICD-10-CM | POA: Diagnosis not present

## 2019-08-26 ENCOUNTER — Ambulatory Visit (INDEPENDENT_AMBULATORY_CARE_PROVIDER_SITE_OTHER): Payer: Medicare HMO | Admitting: *Deleted

## 2019-08-26 DIAGNOSIS — R2990 Unspecified symptoms and signs involving the nervous system: Secondary | ICD-10-CM

## 2019-08-26 LAB — CUP PACEART REMOTE DEVICE CHECK
Date Time Interrogation Session: 20210627230517
Implantable Pulse Generator Implant Date: 20201005

## 2019-08-28 NOTE — Progress Notes (Signed)
Carelink Summary Report / Loop Recorder 

## 2019-09-20 DIAGNOSIS — E7800 Pure hypercholesterolemia, unspecified: Secondary | ICD-10-CM | POA: Diagnosis not present

## 2019-09-20 DIAGNOSIS — I10 Essential (primary) hypertension: Secondary | ICD-10-CM | POA: Diagnosis not present

## 2019-09-20 DIAGNOSIS — E1169 Type 2 diabetes mellitus with other specified complication: Secondary | ICD-10-CM | POA: Diagnosis not present

## 2019-09-20 DIAGNOSIS — G459 Transient cerebral ischemic attack, unspecified: Secondary | ICD-10-CM | POA: Diagnosis not present

## 2019-09-20 DIAGNOSIS — I700 Atherosclerosis of aorta: Secondary | ICD-10-CM | POA: Diagnosis not present

## 2019-09-20 DIAGNOSIS — Z0000 Encounter for general adult medical examination without abnormal findings: Secondary | ICD-10-CM | POA: Diagnosis not present

## 2019-09-20 DIAGNOSIS — E039 Hypothyroidism, unspecified: Secondary | ICD-10-CM | POA: Diagnosis not present

## 2019-09-20 DIAGNOSIS — M810 Age-related osteoporosis without current pathological fracture: Secondary | ICD-10-CM | POA: Diagnosis not present

## 2019-09-20 DIAGNOSIS — Z1389 Encounter for screening for other disorder: Secondary | ICD-10-CM | POA: Diagnosis not present

## 2019-09-30 ENCOUNTER — Ambulatory Visit (INDEPENDENT_AMBULATORY_CARE_PROVIDER_SITE_OTHER): Payer: Medicare HMO | Admitting: *Deleted

## 2019-09-30 DIAGNOSIS — R2990 Unspecified symptoms and signs involving the nervous system: Secondary | ICD-10-CM

## 2019-10-02 LAB — CUP PACEART REMOTE DEVICE CHECK
Date Time Interrogation Session: 20210730230331
Implantable Pulse Generator Implant Date: 20201005

## 2019-10-03 NOTE — Progress Notes (Signed)
Carelink Summary Report / Loop Recorder 

## 2019-10-31 ENCOUNTER — Ambulatory Visit (INDEPENDENT_AMBULATORY_CARE_PROVIDER_SITE_OTHER): Payer: Medicare HMO | Admitting: *Deleted

## 2019-10-31 DIAGNOSIS — R2990 Unspecified symptoms and signs involving the nervous system: Secondary | ICD-10-CM

## 2019-10-31 LAB — CUP PACEART REMOTE DEVICE CHECK
Date Time Interrogation Session: 20210901230630
Implantable Pulse Generator Implant Date: 20201005

## 2019-11-05 NOTE — Progress Notes (Signed)
Carelink Summary Report / Loop Recorder 

## 2019-11-07 ENCOUNTER — Other Ambulatory Visit: Payer: Self-pay

## 2019-11-07 ENCOUNTER — Ambulatory Visit: Payer: Medicare HMO | Admitting: Adult Health

## 2019-11-07 ENCOUNTER — Encounter: Payer: Self-pay | Admitting: Adult Health

## 2019-11-07 VITALS — BP 138/73 | HR 64 | Ht 63.5 in | Wt 126.2 lb

## 2019-11-07 DIAGNOSIS — R2990 Unspecified symptoms and signs involving the nervous system: Secondary | ICD-10-CM

## 2019-11-07 DIAGNOSIS — E119 Type 2 diabetes mellitus without complications: Secondary | ICD-10-CM

## 2019-11-07 DIAGNOSIS — I10 Essential (primary) hypertension: Secondary | ICD-10-CM

## 2019-11-07 DIAGNOSIS — E785 Hyperlipidemia, unspecified: Secondary | ICD-10-CM

## 2019-11-07 NOTE — Patient Instructions (Signed)
Continue aspirin 81 mg daily  and pravastatin for secondary stroke prevention  Will continue to monitor loop recorder for possible atrial fibrillation  Continue to follow up with PCP regarding cholesterol, blood pressure and diabetes management  Maintain strict control of hypertension with blood pressure goal below 130/90, diabetes with hemoglobin A1c goal below 7.0% and cholesterol with LDL cholesterol (bad cholesterol) goal below 70 mg/dL.    As have been doing well from a stroke standpoint. Recommend follow up on a as needed basis - any questions or concerns please do not hesitate to call!    HAPPY EARLY BIRTHDAY!!      Thank you for coming to see [place] at [facility]. I hope we have been able to provide you high quality care today.  You may receive a patient satisfaction survey over the next few weeks. We would appreciate your feedback and comments so that we may continue to improve ourselves and the health of our patients.

## 2019-11-07 NOTE — Progress Notes (Signed)
I agree with the above plan 

## 2019-11-07 NOTE — Progress Notes (Signed)
[facility] [address] [place]. [place] [address] [phone]       STROKE FOLLOW UP NOTE  [name] Date of Birth:  19-Nov-1930 Medical Record Number:  [id number]   Reason for Referral:  stroke follow up    CHIEF COMPLAINT:  Chief Complaint  Patient presents with  . Follow-up    6 month f/u. States she has been doing well since last visit.   Marland Kitchen room 5    with friend    HPI:  Today, 11/07/2019, Dana Saunders returns for follow-up regarding TIA in 11/2018 She is accompanied by a family friend  Stable since prior visit without new or reoccurring stroke/TIA symptoms  Remains on aspirin 81 mg daily without bleeding or bruising Remains on pravastatin without myalgias Blood pressure today 138/73   Loop recorder has not shown atrial fibrillation thus far  No concerns at this time    History provided for reference purposes only Update 05/06/2019 JM: Dana Saunders is a 86 year old female who is being seen today for stroke follow-up accompanied by her niece.  She has been doing well since prior visit without reoccurring or new stroke/TIA symptoms.  Loop recorder is not shown atrial fibrillation thus far.  Continues on aspirin and pravastatin for secondary stroke prevention without side effects.  Blood pressure today 138/88.  Only concern at today's visit is in regards to chronic right hip pain and continues to follow with orthopedics.  No further concerns at this time.  Initial visit 01/03/2019 JM: Dana Saunders is being seen today for hospital follow-up accompanied by her daughter.  She has been doing well since discharge with improvement of her cognition and ambulation.  She continues to receive home health PT mainly due to right hip pain from arthritis.  She has received injections previously and may be receiving additional injections.  She continues to live with her daughter and is able to maintain ADLs independently.  Continues to use rolling walker  while outside of the house but will use cane while inside.  Denies any falls.  Completed 3 weeks DAPT and continues on aspirin alone without bleeding or bruising.  Continues on pravastatin without myalgias.  Blood pressure today 147/93.  Does not routinely monitor at home but daughter plans on obtaining blood pressure cuff for ongoing monitoring.  Loop recorder has not shown atrial fibrillation thus far.  No further concerns at this time.  Stroke admission 12/01/2018: Dana Saunders a 86 y.o.femalewith history of diabetes, hypothyroidism, hypertension, hyperlipidemiawho presented on 11/30/2018 with aphasia. Shereceived IV t-PA on Friday 11/30/2018 At 1415.  CT head showed chronic atrophic and ischemic changes without acute abnormality.  MRI no acute infarct but did show chronic CMB right thalamus or IC.  CTA head/neck negative for E LVO, no significant carotid or vertebral artery stenosis in the neck and no significant cranial stenosis.  Lower extremity venous Dopplers negative for DVT.  2D echo EF of 50 to 55%.  Loop recorder placed to assess for atrial fibrillation as cause of TIA.  LDL 86.  A1c 5.5.  Recommended DAPT for 3 weeks and aspirin alone.  HTN stable.  Recommended continuation of pravastatin 40 mg daily.  Controlled DM and ongoing follow-up with PCP.  Other stroke risk factors include advanced age, former tobacco use and prior EtOH use.  She was discharged home in stable condition with recommendation of home health therapy.  Stroke symptoms s/p tPA: left cortical TIA, concerning for cardioembolic source  Resultant mild left facial droop, chronic  Code Stroke CT Head - not ordered  CT Head - Chronic atrophic and ischemic changes without acute abnormality.   MRI head - no acute infarct, chronic CMB right thalamus or IC.   CTA H&N - No significant carotid or vertebral artery stenosis in the neck No significant cranial stenosis. Negative for large vessel occlusion.   LE venous  dopplerno DVT  2D Echo - EF 50-55%  Loop recorder placed to look for AF as source of stroke  LDL -86  HgbA1c-5.5  No antithromboticprior to admission,now on ASA 81 and plavix 75. Recommend DAPT for 3 weeks and then ASA alone.   Therapy recommendations: Home PT/OT  Disposition: return home    ROS:   14 system review of systems performed and negative with exception of joint pain  PMH:  Past Medical History:  Diagnosis Date  . Diabetes mellitus without complication (HCC)   . Hyperlipemia   . Hypertension   . Stroke [facility])     PSH:  Past Surgical History:  Procedure Laterality Date  . ESOPHAGOGASTRODUODENOSCOPY (EGD) WITH PROPOFOL N/A 05/20/2018   Procedure: ESOPHAGOGASTRODUODENOSCOPY (EGD) WITH PROPOFOL;  Surgeon: Kerin Salen, MD;  Location: [facility] ENDOSCOPY;  Service: Gastroenterology;  Laterality: N/A;  . GASTROSTOMY N/A 05/20/2018   Procedure: Insertion Of Gastrostomy Tube and Repair of Volvulus;  Surgeon: Violeta Gelinas, MD;  Location: [facility] OR;  Service: General;  Laterality: N/A;  . LAPAROTOMY N/A 05/20/2018   Procedure: EXPLORATORY LAPAROTOMY;  Surgeon: Violeta Gelinas, MD;  Location: [facility] OR;  Service: General;  Laterality: N/A;  . LOOP RECORDER IMPLANT  10/2018  . LOOP RECORDER INSERTION N/A 12/03/2018   Procedure: LOOP RECORDER INSERTION;  Surgeon: Marinus Maw, MD;  Location[facility] INVASIVE CV LAB;  Service: Cardiovascular;  Laterality: N/A;    Social History:  Social History   Socioeconomic History  . Marital status: Widowed    Spouse name: Not on file  . Number of children: 3  . Years of education: 11  . Highest education level: Not on file  Occupational History    Comment: retired  Tobacco Use  . Smoking status: Never Smoker  . Smokeless tobacco: Never Used  Vaping Use  . Vaping Use: Never used  Substance and Sexual Activity  . Alcohol use: Not Currently  . Drug use: Never  . Sexual activity: Not on file  Other Topics Concern  . Not on file    Social History Narrative   Lives with dgtr, Selena Batten   Caffeine- one daily   Social Determinants of Health   Financial Resource Strain:   . Difficulty of Paying Living Expenses: Not on file  Food Insecurity:   . Worried About Programme researcher, broadcasting/film/video in the Last Year: Not on file  . Ran Out of Food in the Last Year: Not on file  Transportation Needs:   . Lack of Transportation (Medical): Not on file  . Lack of Transportation (Non-Medical): Not on file  Physical Activity:   . Days of Exercise per Week: Not on file  . Minutes of Exercise per Session: Not on file  Stress:   . Feeling of Stress : Not on file  Social Connections:   . Frequency of Communication with Friends and Family: Not on file  . Frequency of Social Gatherings with Friends and Family: Not on file  . Attends Religious Services: Not on file  . Active Member of Clubs or Organizations: Not on file  . Attends Banker Meetings: Not on file  .  Marital Status: Not on file  Intimate Partner Violence:   . Fear of Current or Ex-Partner: Not on file  . Emotionally Abused: Not on file  . Physically Abused: Not on file  . Sexually Abused: Not on file    Family History:  Family History  Problem Relation Age of Onset  . Diabetes Mother   . Cancer Father     Medications:   Current Outpatient Medications on File Prior to Visit  Medication Sig Dispense Refill  . alendronate (FOSAMAX) 70 MG tablet Take 70 mg by mouth once a week. Friday    . aspirin EC 81 MG EC tablet Take 1 tablet (81 mg total) by mouth daily.    . cetirizine (ZYRTEC) 10 MG tablet Take 10 mg by mouth daily.    . feeding supplement, ENSURE ENLIVE, (ENSURE ENLIVE) LIQD Take 237 mLs by mouth 2 (two) times daily between meals. 237 mL 12  . levothyroxine (SYNTHROID, LEVOTHROID) 88 MCG tablet Take 88 mcg by mouth every morning.    Marland Kitchen losartan (COZAAR) 50 MG tablet Take 50 mg by mouth daily.    . metFORMIN (GLUCOPHAGE) 500 MG tablet Take 500 mg by mouth 2  (two) times daily.     . Multiple Vitamin (MULTIVITAMIN WITH MINERALS) TABS tablet Take 1 tablet by mouth daily. (Patient taking differently: Take 1 tablet by mouth daily. 1x daily) 30 tablet 0  . polyethylene glycol (MIRALAX / GLYCOLAX) packet Take 17 g by mouth daily. 14 each 0  . pravastatin (PRAVACHOL) 40 MG tablet Take 40 mg by mouth daily.     No current facility-administered medications on file prior to visit.    Allergies:  No Known Allergies   Physical Exam  Vitals:   11/07/19 1344  BP: 138/73  Pulse: 64  Weight: 126 lb 3.2 oz (57.2 kg)  Height: 5' 3.5" (1.613 m)   Body mass index is 22 kg/m. No exam data present   General: Frail very pleasant elderly African-American female, seated, in no evident distress Head: head normocephalic and atraumatic.   Neck: supple with no carotid or supraclavicular bruits Cardiovascular: regular rate and rhythm, no murmurs Musculoskeletal: no deformity Skin:  no rash/petichiae Vascular:  Normal pulses all extremities   Neurologic Exam Mental Status: Awake and fully alert.   Fluent speech and language.  Oriented to place and time. Recent and remote memory intact. Attention span, concentration and fund of knowledge appropriate. Mood and affect appropriate.  Cranial Nerves: Pupils equal, briskly reactive to light. Extraocular movements full without nystagmus. Visual fields full to confrontation. Hearing intact. Facial sensation intact. Face, tongue, palate moves normally and symmetrically.  Motor: Normal bulk and tone. Normal strength in all tested extremity muscles except mildly decreased right hip flexor weakness due to pain Sensory.: intact to touch , pinprick , position and vibratory sensation.  Coordination: Rapid alternating movements normal in all extremities. Finger-to-nose performed accurately bilaterally and heel-to-shin difficulty performing due to hip pain Gait and Station: Arises from chair without difficulty. Stance is slightly  hunched. Gait demonstrates  short shuffled steps with assistance of Rollator walker Reflexes: 1+ and symmetric. Toes downgoing.      ASSESSMENT: Dana Saunders is a 86 y.o. year old female presented with transient episodes of aphasia on 12/01/2018 therefore received tPA and likely left cortical TIA s/p TPA concerning for cardioembolic source therefore loop recorder placed to assess for atrial fibrillation.  Vascular risk factors include HTN, HLD and DM.      PLAN:  1.  TIA:  a. Continue aspirin 81 mg daily  and pravastatin for secondary stroke prevention.   b. Loop recorder has not shown atrial fibrillation thus far. Will continue to be monitored by cardiology.   c. Discussed importance of close PCP follow-up for aggressive stroke risk factor management 2. HTN:  a. BP goal<130/90.   b. Stable.   c. Remains on losartan managed by PCP. 3. HLD:  a. LDL goal<70.   b. Plans on repeat lab work in the near future with PCP c. Remains on pravastatin 40 mg daily managed by PCP. 4. DMII: a.  A1c goal<7.0.   b. Remains on Metformin managed by PCP.    Overall stable from stroke standpoint and recommend follow-up as needed   I spent 20 minutes of face-to-face and non-face-to-face time with patient and friend.  This included previsit chart review, lab review, study review, order entry, electronic health record documentation, patient education regarding history of TIA, importance of managing stroke risk factors, ongoing monitoring of loop recorder and answered all other questions to patient satisfaction   Ihor AustinJessica McCue, [facility] Behavioral Health CenterGNP-BC  [facility] [address] [place], [place] [id number]  Phone [phone] Fax [phone] Note: This document was prepared with digital dictation and possible smart phrase technology. Any transcriptional errors that result from this process are unintentional.

## 2019-11-20 DIAGNOSIS — M1611 Unilateral primary osteoarthritis, right hip: Secondary | ICD-10-CM | POA: Diagnosis not present

## 2019-12-03 ENCOUNTER — Ambulatory Visit (INDEPENDENT_AMBULATORY_CARE_PROVIDER_SITE_OTHER): Payer: Medicare HMO

## 2019-12-03 DIAGNOSIS — R2990 Unspecified symptoms and signs involving the nervous system: Secondary | ICD-10-CM

## 2019-12-03 LAB — CUP PACEART REMOTE DEVICE CHECK
Date Time Interrogation Session: 20211004230156
Implantable Pulse Generator Implant Date: 20201005

## 2019-12-06 NOTE — Progress Notes (Signed)
Carelink Summary Report / Loop Recorder 

## 2020-01-05 LAB — CUP PACEART REMOTE DEVICE CHECK
Date Time Interrogation Session: 20211106230055
Implantable Pulse Generator Implant Date: 20201005

## 2020-01-06 ENCOUNTER — Ambulatory Visit (INDEPENDENT_AMBULATORY_CARE_PROVIDER_SITE_OTHER): Payer: Medicare HMO

## 2020-01-06 DIAGNOSIS — R2990 Unspecified symptoms and signs involving the nervous system: Secondary | ICD-10-CM | POA: Diagnosis not present

## 2020-01-07 NOTE — Progress Notes (Signed)
Carelink Summary Report / Loop Recorder 

## 2020-02-07 ENCOUNTER — Ambulatory Visit (INDEPENDENT_AMBULATORY_CARE_PROVIDER_SITE_OTHER): Payer: Medicare HMO

## 2020-02-07 DIAGNOSIS — R2990 Unspecified symptoms and signs involving the nervous system: Secondary | ICD-10-CM

## 2020-02-09 LAB — CUP PACEART REMOTE DEVICE CHECK
Date Time Interrogation Session: 20211210003001
Implantable Pulse Generator Implant Date: 20201005

## 2020-02-20 NOTE — Progress Notes (Signed)
Carelink Summary Report / Loop Recorder 

## 2020-03-11 ENCOUNTER — Ambulatory Visit (INDEPENDENT_AMBULATORY_CARE_PROVIDER_SITE_OTHER): Payer: Medicare HMO

## 2020-03-11 DIAGNOSIS — R2990 Unspecified symptoms and signs involving the nervous system: Secondary | ICD-10-CM | POA: Diagnosis not present

## 2020-03-11 LAB — CUP PACEART REMOTE DEVICE CHECK
Date Time Interrogation Session: 20220111230045
Implantable Pulse Generator Implant Date: 20201005

## 2020-03-23 ENCOUNTER — Other Ambulatory Visit: Payer: Self-pay | Admitting: Internal Medicine

## 2020-03-23 ENCOUNTER — Other Ambulatory Visit: Payer: Self-pay

## 2020-03-23 ENCOUNTER — Ambulatory Visit
Admission: RE | Admit: 2020-03-23 | Discharge: 2020-03-23 | Disposition: A | Discharge status: Home / Self Care | Payer: Medicare HMO | Source: Ambulatory Visit | Attending: Internal Medicine | Admitting: Internal Medicine

## 2020-03-23 DIAGNOSIS — I700 Atherosclerosis of aorta: Secondary | ICD-10-CM | POA: Diagnosis not present

## 2020-03-23 DIAGNOSIS — K5900 Constipation, unspecified: Secondary | ICD-10-CM | POA: Diagnosis not present

## 2020-03-23 DIAGNOSIS — G459 Transient cerebral ischemic attack, unspecified: Secondary | ICD-10-CM | POA: Diagnosis not present

## 2020-03-23 DIAGNOSIS — I10 Essential (primary) hypertension: Secondary | ICD-10-CM | POA: Diagnosis not present

## 2020-03-23 DIAGNOSIS — M419 Scoliosis, unspecified: Secondary | ICD-10-CM | POA: Diagnosis not present

## 2020-03-23 DIAGNOSIS — M533 Sacrococcygeal disorders, not elsewhere classified: Secondary | ICD-10-CM | POA: Diagnosis not present

## 2020-03-23 DIAGNOSIS — E7800 Pure hypercholesterolemia, unspecified: Secondary | ICD-10-CM | POA: Diagnosis not present

## 2020-03-23 DIAGNOSIS — R634 Abnormal weight loss: Secondary | ICD-10-CM | POA: Diagnosis not present

## 2020-03-23 DIAGNOSIS — M810 Age-related osteoporosis without current pathological fracture: Secondary | ICD-10-CM | POA: Diagnosis not present

## 2020-03-23 DIAGNOSIS — E1169 Type 2 diabetes mellitus with other specified complication: Secondary | ICD-10-CM | POA: Diagnosis not present

## 2020-03-24 NOTE — Progress Notes (Signed)
Carelink Summary Report / Loop Recorder 

## 2020-04-13 ENCOUNTER — Ambulatory Visit (INDEPENDENT_AMBULATORY_CARE_PROVIDER_SITE_OTHER): Payer: Medicare HMO

## 2020-04-13 DIAGNOSIS — R2990 Unspecified symptoms and signs involving the nervous system: Secondary | ICD-10-CM | POA: Diagnosis not present

## 2020-04-13 LAB — CUP PACEART REMOTE DEVICE CHECK
Date Time Interrogation Session: 20220213230426
Implantable Pulse Generator Implant Date: 20201005

## 2020-04-20 NOTE — Progress Notes (Signed)
Carelink Summary Report / Loop Recorder 

## 2020-05-18 ENCOUNTER — Ambulatory Visit (INDEPENDENT_AMBULATORY_CARE_PROVIDER_SITE_OTHER): Payer: Medicare HMO

## 2020-05-18 DIAGNOSIS — R2990 Unspecified symptoms and signs involving the nervous system: Secondary | ICD-10-CM | POA: Diagnosis not present

## 2020-05-18 LAB — CUP PACEART REMOTE DEVICE CHECK
Date Time Interrogation Session: 20220318230409
Implantable Pulse Generator Implant Date: 20201005

## 2020-05-26 NOTE — Progress Notes (Signed)
Carelink Summary Report / Loop Recorder 

## 2020-06-03 DIAGNOSIS — M1611 Unilateral primary osteoarthritis, right hip: Secondary | ICD-10-CM | POA: Diagnosis not present

## 2020-06-18 ENCOUNTER — Ambulatory Visit (INDEPENDENT_AMBULATORY_CARE_PROVIDER_SITE_OTHER): Payer: Medicare HMO

## 2020-06-18 DIAGNOSIS — R2990 Unspecified symptoms and signs involving the nervous system: Secondary | ICD-10-CM

## 2020-06-18 LAB — CUP PACEART REMOTE DEVICE CHECK
Date Time Interrogation Session: 20220420230250
Implantable Pulse Generator Implant Date: 20201005

## 2020-07-07 NOTE — Progress Notes (Signed)
Carelink Summary Report / Loop Recorder 

## 2020-07-20 DIAGNOSIS — Z20822 Contact with and (suspected) exposure to covid-19: Secondary | ICD-10-CM | POA: Diagnosis not present

## 2020-07-21 ENCOUNTER — Ambulatory Visit (INDEPENDENT_AMBULATORY_CARE_PROVIDER_SITE_OTHER): Payer: Medicare HMO

## 2020-07-21 DIAGNOSIS — R2990 Unspecified symptoms and signs involving the nervous system: Secondary | ICD-10-CM

## 2020-07-21 LAB — CUP PACEART REMOTE DEVICE CHECK
Date Time Interrogation Session: 20220523230449
Implantable Pulse Generator Implant Date: 20201005

## 2020-08-12 NOTE — Progress Notes (Signed)
Carelink Summary Report / Loop Recorder 

## 2020-08-24 ENCOUNTER — Ambulatory Visit (INDEPENDENT_AMBULATORY_CARE_PROVIDER_SITE_OTHER): Payer: Medicare HMO

## 2020-08-24 DIAGNOSIS — R2990 Unspecified symptoms and signs involving the nervous system: Secondary | ICD-10-CM

## 2020-08-25 LAB — CUP PACEART REMOTE DEVICE CHECK
Date Time Interrogation Session: 20220625230608
Implantable Pulse Generator Implant Date: 20201005

## 2020-09-11 NOTE — Progress Notes (Signed)
Carelink Summary Report / Loop Recorder 

## 2020-09-17 DIAGNOSIS — I700 Atherosclerosis of aorta: Secondary | ICD-10-CM | POA: Diagnosis not present

## 2020-09-17 DIAGNOSIS — E7800 Pure hypercholesterolemia, unspecified: Secondary | ICD-10-CM | POA: Diagnosis not present

## 2020-09-17 DIAGNOSIS — E039 Hypothyroidism, unspecified: Secondary | ICD-10-CM | POA: Diagnosis not present

## 2020-09-17 DIAGNOSIS — Z5181 Encounter for therapeutic drug level monitoring: Secondary | ICD-10-CM | POA: Diagnosis not present

## 2020-09-17 DIAGNOSIS — M810 Age-related osteoporosis without current pathological fracture: Secondary | ICD-10-CM | POA: Diagnosis not present

## 2020-09-17 DIAGNOSIS — G459 Transient cerebral ischemic attack, unspecified: Secondary | ICD-10-CM | POA: Diagnosis not present

## 2020-09-17 DIAGNOSIS — I10 Essential (primary) hypertension: Secondary | ICD-10-CM | POA: Diagnosis not present

## 2020-09-17 DIAGNOSIS — E1169 Type 2 diabetes mellitus with other specified complication: Secondary | ICD-10-CM | POA: Diagnosis not present

## 2020-09-26 LAB — CUP PACEART REMOTE DEVICE CHECK
Date Time Interrogation Session: 20220728230444
Implantable Pulse Generator Implant Date: 20201005

## 2020-09-28 ENCOUNTER — Ambulatory Visit (INDEPENDENT_AMBULATORY_CARE_PROVIDER_SITE_OTHER): Payer: Medicare HMO

## 2020-09-28 DIAGNOSIS — R2990 Unspecified symptoms and signs involving the nervous system: Secondary | ICD-10-CM

## 2020-09-28 IMAGING — DX PORTABLE ABDOMEN - 1 VIEW
1 series · 1 of 1 positions shown · non-contrast
Comparison: None.

CLINICAL DATA: Status post NG tube placement.

EXAM:
PORTABLE ABDOMEN - 1 VIEW

[abdomen]
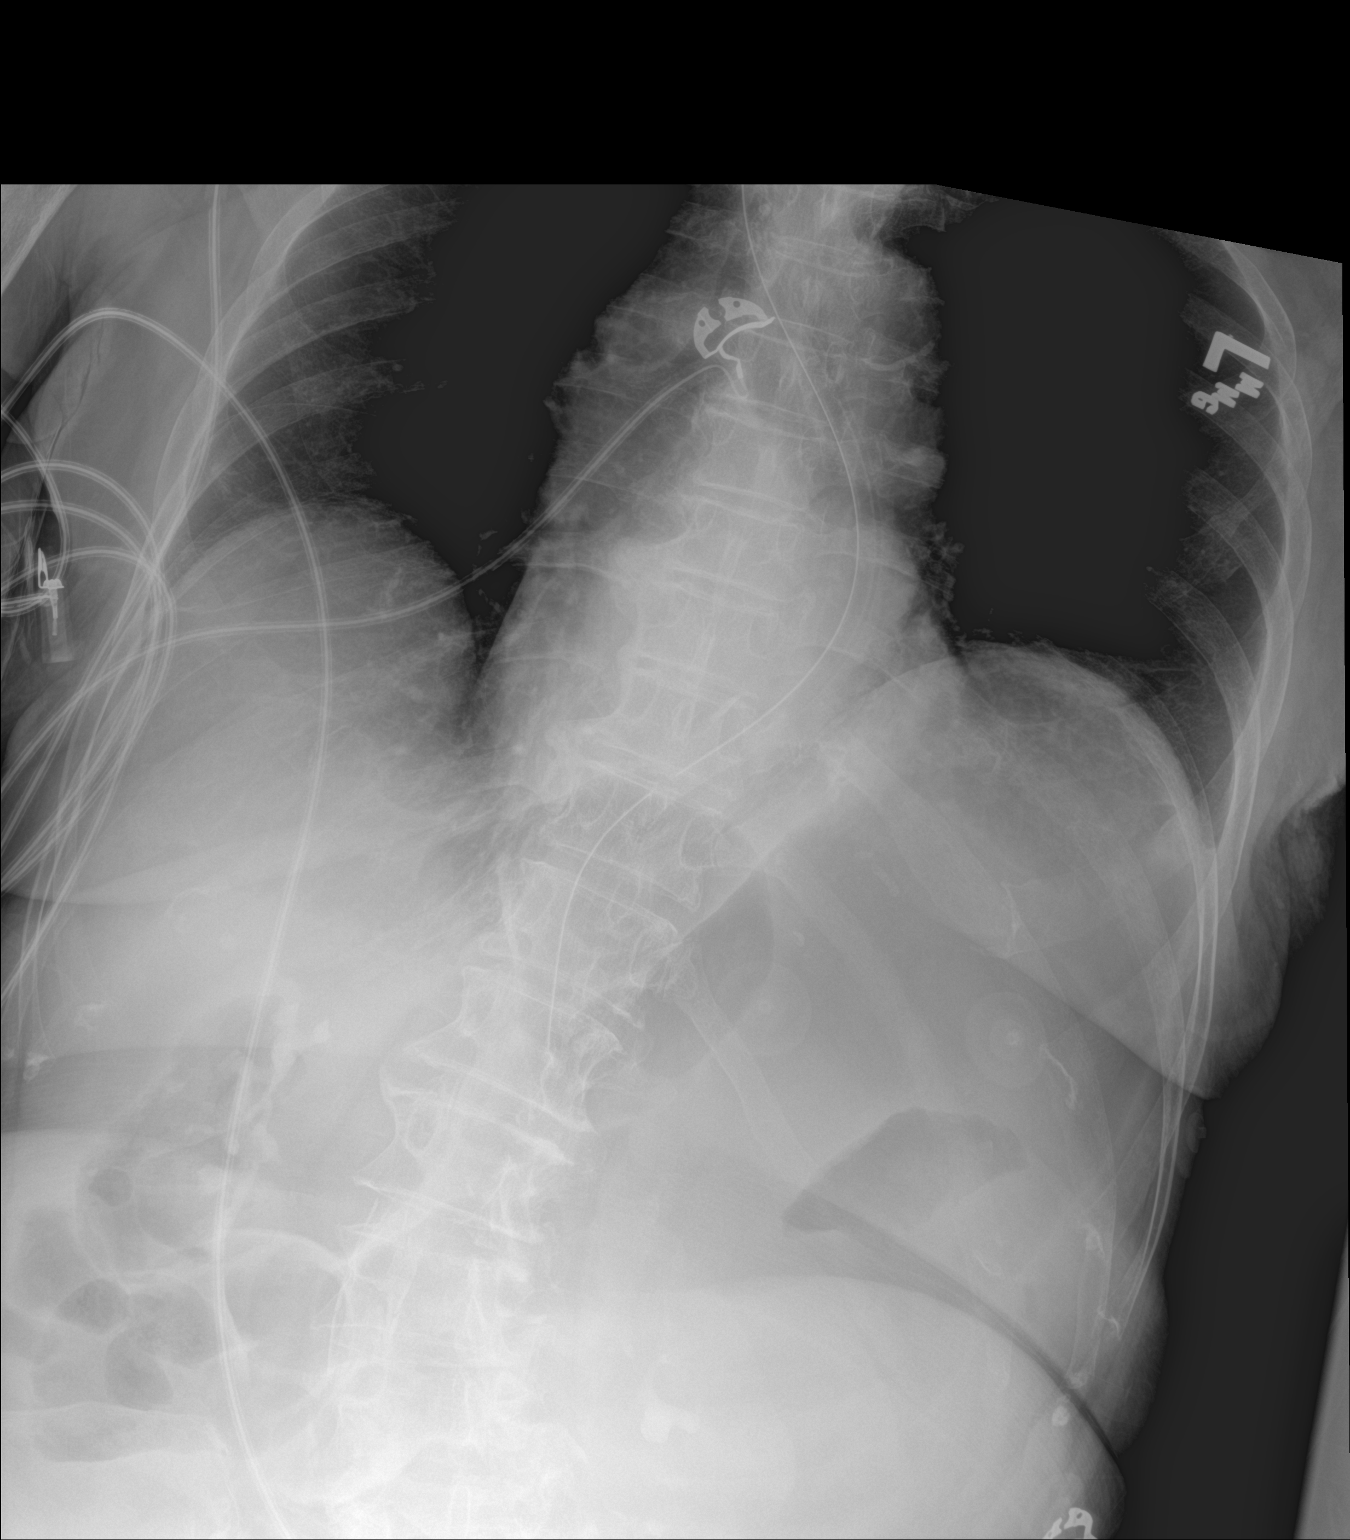

[1 of 1 positions shown; findings below may reference images not displayed]

FINDINGS: NG tube is in place with the tip in the stomach. Side-port is at the
gastroesophageal junction. Recommend advancement of 3-4 cm.
IMPRESSION: As above.

## 2020-09-28 IMAGING — CR CHEST - 2 VIEW
2 series · 2 of 2 positions shown · non-contrast
Comparison: Chest x-ray dated 10/12/2017.

CLINICAL DATA: Abdominal pain, cough and shortness of breath since
yesterday.

EXAM:
CHEST - 2 VIEW

[chest lat]
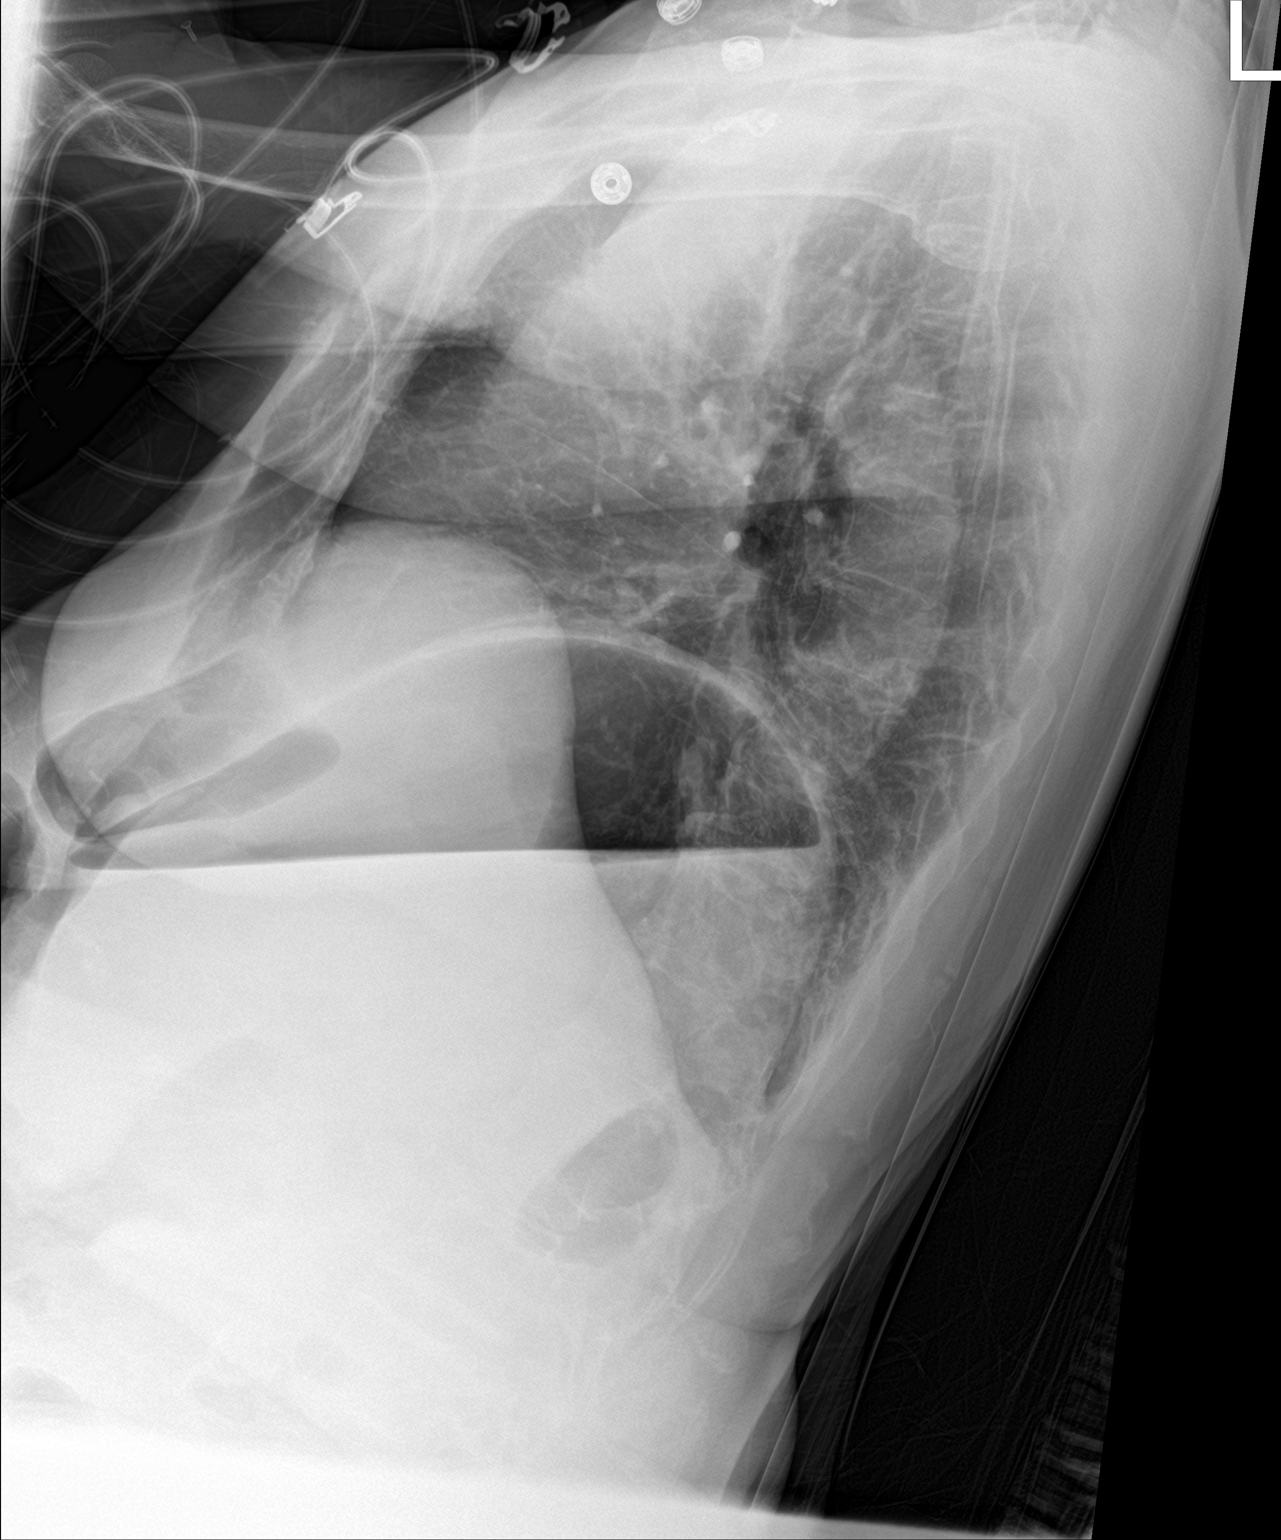

[chest ap]
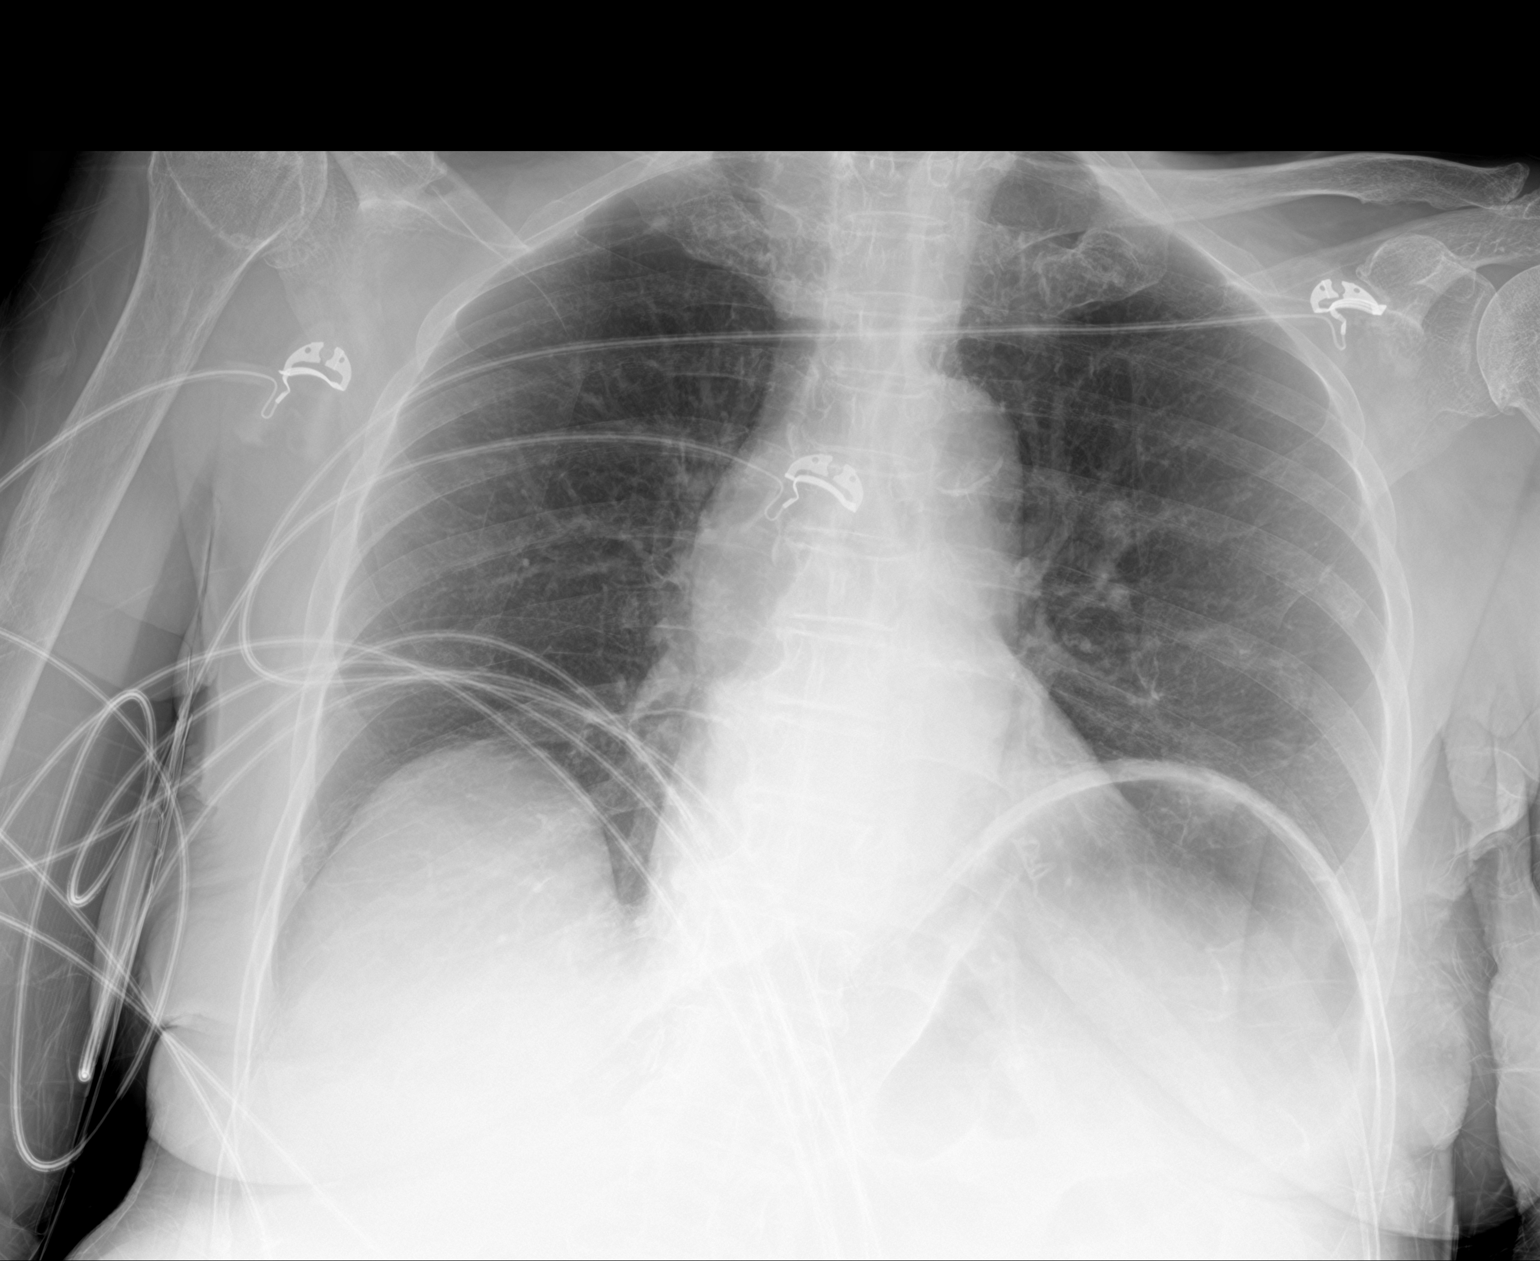

[2 of 2 positions shown; findings below may reference images not displayed]

FINDINGS: Heart size and mediastinal contours are within normal limits. Lungs
are clear. No pleural effusion or pneumothorax seen.

Large amount of air under the LEFT hemidiaphragm. This is most
likely within stomach or colon, with associated air-fluid level.
Similar appearance demonstrated on earlier chest x-rays of
10/12/2017 and 06/06/2012.

Osseous structures about the chest are unremarkable.
IMPRESSION: 1. No active cardiopulmonary disease. No evidence of pneumonia or
pulmonary edema.
2. Large amount of air under the LEFT hemidiaphragm, most likely air
within distended stomach or colon with associated air-fluid level.
Given the history of abdominal pain, would consider CT abdomen and
pelvis to exclude associated bowel obstruction and to exclude the
less likely possibility of free intraperitoneal air.

## 2020-10-01 DIAGNOSIS — E1169 Type 2 diabetes mellitus with other specified complication: Secondary | ICD-10-CM | POA: Diagnosis not present

## 2020-10-01 DIAGNOSIS — Z79899 Other long term (current) drug therapy: Secondary | ICD-10-CM | POA: Diagnosis not present

## 2020-10-01 DIAGNOSIS — E7800 Pure hypercholesterolemia, unspecified: Secondary | ICD-10-CM | POA: Diagnosis not present

## 2020-10-01 DIAGNOSIS — I700 Atherosclerosis of aorta: Secondary | ICD-10-CM | POA: Diagnosis not present

## 2020-10-01 DIAGNOSIS — E039 Hypothyroidism, unspecified: Secondary | ICD-10-CM | POA: Diagnosis not present

## 2020-10-01 DIAGNOSIS — Z7984 Long term (current) use of oral hypoglycemic drugs: Secondary | ICD-10-CM | POA: Diagnosis not present

## 2020-10-21 NOTE — Progress Notes (Signed)
Carelink Summary Report / Loop Recorder 

## 2020-10-29 ENCOUNTER — Ambulatory Visit (INDEPENDENT_AMBULATORY_CARE_PROVIDER_SITE_OTHER): Payer: Medicare HMO

## 2020-10-29 DIAGNOSIS — R2990 Unspecified symptoms and signs involving the nervous system: Secondary | ICD-10-CM | POA: Diagnosis not present

## 2020-11-03 LAB — CUP PACEART REMOTE DEVICE CHECK
Date Time Interrogation Session: 20220830230332
Implantable Pulse Generator Implant Date: 20201005

## 2020-11-09 DIAGNOSIS — M1611 Unilateral primary osteoarthritis, right hip: Secondary | ICD-10-CM | POA: Diagnosis not present

## 2020-11-10 NOTE — Progress Notes (Signed)
Carelink Summary Report / Loop Recorder 

## 2020-11-29 LAB — CUP PACEART REMOTE DEVICE CHECK
Date Time Interrogation Session: 20221002230109
Implantable Pulse Generator Implant Date: 20201005

## 2020-12-07 ENCOUNTER — Ambulatory Visit (INDEPENDENT_AMBULATORY_CARE_PROVIDER_SITE_OTHER): Payer: Medicare HMO

## 2020-12-07 DIAGNOSIS — R2990 Unspecified symptoms and signs involving the nervous system: Secondary | ICD-10-CM | POA: Diagnosis not present

## 2020-12-16 NOTE — Progress Notes (Signed)
Carelink Summary Report / Loop Recorder 

## 2021-01-07 LAB — CUP PACEART REMOTE DEVICE CHECK
Date Time Interrogation Session: 20221104230628
Implantable Pulse Generator Implant Date: 20201005

## 2021-01-11 ENCOUNTER — Ambulatory Visit (INDEPENDENT_AMBULATORY_CARE_PROVIDER_SITE_OTHER): Payer: Medicare HMO

## 2021-01-11 DIAGNOSIS — R2990 Unspecified symptoms and signs involving the nervous system: Secondary | ICD-10-CM

## 2021-01-19 NOTE — Progress Notes (Signed)
Carelink Summary Report / Loop Recorder 

## 2021-02-15 ENCOUNTER — Ambulatory Visit (INDEPENDENT_AMBULATORY_CARE_PROVIDER_SITE_OTHER): Payer: Medicare HMO

## 2021-02-15 DIAGNOSIS — R2990 Unspecified symptoms and signs involving the nervous system: Secondary | ICD-10-CM | POA: Diagnosis not present

## 2021-02-15 LAB — CUP PACEART REMOTE DEVICE CHECK
Date Time Interrogation Session: 20221217231058
Implantable Pulse Generator Implant Date: 20201005

## 2021-02-25 NOTE — Progress Notes (Signed)
Carelink Summary Report / Loop Recorder 

## 2021-03-22 ENCOUNTER — Ambulatory Visit (INDEPENDENT_AMBULATORY_CARE_PROVIDER_SITE_OTHER): Payer: Medicare HMO

## 2021-03-22 DIAGNOSIS — R2990 Unspecified symptoms and signs involving the nervous system: Secondary | ICD-10-CM | POA: Diagnosis not present

## 2021-03-22 DIAGNOSIS — I10 Essential (primary) hypertension: Secondary | ICD-10-CM | POA: Diagnosis not present

## 2021-03-22 DIAGNOSIS — Z7984 Long term (current) use of oral hypoglycemic drugs: Secondary | ICD-10-CM | POA: Diagnosis not present

## 2021-03-22 DIAGNOSIS — G459 Transient cerebral ischemic attack, unspecified: Secondary | ICD-10-CM | POA: Diagnosis not present

## 2021-03-22 DIAGNOSIS — E1169 Type 2 diabetes mellitus with other specified complication: Secondary | ICD-10-CM | POA: Diagnosis not present

## 2021-03-22 DIAGNOSIS — E7800 Pure hypercholesterolemia, unspecified: Secondary | ICD-10-CM | POA: Diagnosis not present

## 2021-03-22 DIAGNOSIS — E039 Hypothyroidism, unspecified: Secondary | ICD-10-CM | POA: Diagnosis not present

## 2021-03-22 DIAGNOSIS — E1165 Type 2 diabetes mellitus with hyperglycemia: Secondary | ICD-10-CM | POA: Diagnosis not present

## 2021-03-22 DIAGNOSIS — M810 Age-related osteoporosis without current pathological fracture: Secondary | ICD-10-CM | POA: Diagnosis not present

## 2021-03-22 DIAGNOSIS — I700 Atherosclerosis of aorta: Secondary | ICD-10-CM | POA: Diagnosis not present

## 2021-03-22 LAB — CUP PACEART REMOTE DEVICE CHECK
Date Time Interrogation Session: 20230122230326
Implantable Pulse Generator Implant Date: 20201005

## 2021-04-02 NOTE — Progress Notes (Signed)
Carelink Summary Report / Loop Recorder 

## 2021-04-25 LAB — CUP PACEART REMOTE DEVICE CHECK
Date Time Interrogation Session: 20230224230549
Implantable Pulse Generator Implant Date: 20201005

## 2021-04-26 ENCOUNTER — Ambulatory Visit (INDEPENDENT_AMBULATORY_CARE_PROVIDER_SITE_OTHER): Payer: Medicare HMO

## 2021-04-26 DIAGNOSIS — R2990 Unspecified symptoms and signs involving the nervous system: Secondary | ICD-10-CM

## 2021-05-03 NOTE — Progress Notes (Signed)
Carelink Summary Report / Loop Recorder 

## 2021-05-17 DIAGNOSIS — M1611 Unilateral primary osteoarthritis, right hip: Secondary | ICD-10-CM | POA: Diagnosis not present

## 2021-05-31 ENCOUNTER — Ambulatory Visit (INDEPENDENT_AMBULATORY_CARE_PROVIDER_SITE_OTHER): Payer: Medicare HMO

## 2021-05-31 DIAGNOSIS — R2990 Unspecified symptoms and signs involving the nervous system: Secondary | ICD-10-CM

## 2021-06-01 LAB — CUP PACEART REMOTE DEVICE CHECK
Date Time Interrogation Session: 20230331230541
Implantable Pulse Generator Implant Date: 20201005

## 2021-06-15 NOTE — Progress Notes (Signed)
Carelink Summary Report / Loop Recorder 

## 2021-07-01 LAB — CUP PACEART REMOTE DEVICE CHECK
Date Time Interrogation Session: 20230503230802
Implantable Pulse Generator Implant Date: 20201005

## 2021-07-05 ENCOUNTER — Ambulatory Visit (INDEPENDENT_AMBULATORY_CARE_PROVIDER_SITE_OTHER): Payer: Medicare HMO

## 2021-07-05 DIAGNOSIS — R2990 Unspecified symptoms and signs involving the nervous system: Secondary | ICD-10-CM | POA: Diagnosis not present

## 2021-07-23 NOTE — Progress Notes (Signed)
Carelink Summary Report / Loop Recorder 

## 2021-08-09 ENCOUNTER — Ambulatory Visit (INDEPENDENT_AMBULATORY_CARE_PROVIDER_SITE_OTHER): Payer: Medicare HMO

## 2021-08-09 DIAGNOSIS — R2990 Unspecified symptoms and signs involving the nervous system: Secondary | ICD-10-CM | POA: Diagnosis not present

## 2021-08-10 LAB — CUP PACEART REMOTE DEVICE CHECK
Date Time Interrogation Session: 20230605230617
Implantable Pulse Generator Implant Date: 20201005

## 2021-08-25 NOTE — Progress Notes (Signed)
Carelink Summary Report / Loop Recorder 

## 2021-09-09 LAB — CUP PACEART REMOTE DEVICE CHECK
Date Time Interrogation Session: 20230708230408
Implantable Pulse Generator Implant Date: 20201005

## 2021-09-13 ENCOUNTER — Ambulatory Visit (INDEPENDENT_AMBULATORY_CARE_PROVIDER_SITE_OTHER): Payer: Medicare HMO

## 2021-09-13 DIAGNOSIS — R2990 Unspecified symptoms and signs involving the nervous system: Secondary | ICD-10-CM

## 2021-09-27 DIAGNOSIS — E7800 Pure hypercholesterolemia, unspecified: Secondary | ICD-10-CM | POA: Diagnosis not present

## 2021-09-27 DIAGNOSIS — E039 Hypothyroidism, unspecified: Secondary | ICD-10-CM | POA: Diagnosis not present

## 2021-09-27 DIAGNOSIS — E1169 Type 2 diabetes mellitus with other specified complication: Secondary | ICD-10-CM | POA: Diagnosis not present

## 2021-09-27 DIAGNOSIS — M810 Age-related osteoporosis without current pathological fracture: Secondary | ICD-10-CM | POA: Diagnosis not present

## 2021-09-27 DIAGNOSIS — L723 Sebaceous cyst: Secondary | ICD-10-CM | POA: Diagnosis not present

## 2021-09-27 DIAGNOSIS — Z5181 Encounter for therapeutic drug level monitoring: Secondary | ICD-10-CM | POA: Diagnosis not present

## 2021-09-27 DIAGNOSIS — I10 Essential (primary) hypertension: Secondary | ICD-10-CM | POA: Diagnosis not present

## 2021-09-27 DIAGNOSIS — Z1389 Encounter for screening for other disorder: Secondary | ICD-10-CM | POA: Diagnosis not present

## 2021-09-27 DIAGNOSIS — Z79899 Other long term (current) drug therapy: Secondary | ICD-10-CM | POA: Diagnosis not present

## 2021-09-27 DIAGNOSIS — I700 Atherosclerosis of aorta: Secondary | ICD-10-CM | POA: Diagnosis not present

## 2021-09-27 DIAGNOSIS — Z0000 Encounter for general adult medical examination without abnormal findings: Secondary | ICD-10-CM | POA: Diagnosis not present

## 2021-09-27 DIAGNOSIS — Z1331 Encounter for screening for depression: Secondary | ICD-10-CM | POA: Diagnosis not present

## 2021-10-12 ENCOUNTER — Telehealth: Payer: Self-pay | Admitting: Student

## 2021-10-12 NOTE — Telephone Encounter (Signed)
I spoke with the patient daughter Jasmine December. I let her know that the patient has a [employer] loop recorder. It last for four years. I told her we only call or bring the patient in the office if the nurse finds something concerning on the loop. I told her that if we do not find anything we will not call or bring the patient in the office. Jasmine December verbalized understanding and thanked me for my help.

## 2021-10-12 NOTE — Telephone Encounter (Signed)
Pt wants to know how long does her pacemaker stay in and is she supposed to come back for a check. Pt would like for you to call her daughter phone below.    Jasmine December daughter [phone]

## 2021-10-12 NOTE — Telephone Encounter (Signed)
Attempted to return phone call. Phone went straight to VM, LMTCB.  Patient has ILR and expected 4 years of battery life.  Also, patient is not due to see Dr. Ladona Ridgel in office due to no episodes found of device on lifetime of device.

## 2021-10-14 NOTE — Progress Notes (Signed)
Carelink Summary Report / Loop Recorder 

## 2021-10-18 ENCOUNTER — Ambulatory Visit (INDEPENDENT_AMBULATORY_CARE_PROVIDER_SITE_OTHER): Payer: Medicare HMO

## 2021-10-18 DIAGNOSIS — R2990 Unspecified symptoms and signs involving the nervous system: Secondary | ICD-10-CM | POA: Diagnosis not present

## 2021-10-19 LAB — CUP PACEART REMOTE DEVICE CHECK
Date Time Interrogation Session: 20230820230914
Implantable Pulse Generator Implant Date: 20201005

## 2021-10-25 DIAGNOSIS — M25552 Pain in left hip: Secondary | ICD-10-CM | POA: Diagnosis not present

## 2021-11-15 NOTE — Progress Notes (Signed)
Carelink Summary Report / Loop Recorder 

## 2021-11-22 ENCOUNTER — Ambulatory Visit (INDEPENDENT_AMBULATORY_CARE_PROVIDER_SITE_OTHER): Payer: Medicare HMO

## 2021-11-22 DIAGNOSIS — R2990 Unspecified symptoms and signs involving the nervous system: Secondary | ICD-10-CM

## 2021-11-23 LAB — CUP PACEART REMOTE DEVICE CHECK
Date Time Interrogation Session: 20230922230355
Implantable Pulse Generator Implant Date: 20201005

## 2021-12-07 NOTE — Progress Notes (Signed)
Carelink Summary Report / Loop Recorder 

## 2021-12-24 LAB — CUP PACEART REMOTE DEVICE CHECK
Date Time Interrogation Session: 20231025230812
Implantable Pulse Generator Implant Date: 20201005

## 2021-12-27 ENCOUNTER — Ambulatory Visit (INDEPENDENT_AMBULATORY_CARE_PROVIDER_SITE_OTHER): Payer: Medicare HMO

## 2021-12-27 DIAGNOSIS — R2990 Unspecified symptoms and signs involving the nervous system: Secondary | ICD-10-CM | POA: Diagnosis not present

## 2022-01-29 NOTE — Progress Notes (Signed)
Carelink Summary Report / Loop Recorder 

## 2022-01-31 ENCOUNTER — Ambulatory Visit (INDEPENDENT_AMBULATORY_CARE_PROVIDER_SITE_OTHER): Payer: Medicare HMO

## 2022-01-31 DIAGNOSIS — R2990 Unspecified symptoms and signs involving the nervous system: Secondary | ICD-10-CM | POA: Diagnosis not present

## 2022-01-31 LAB — CUP PACEART REMOTE DEVICE CHECK
Date Time Interrogation Session: 20231203230819
Implantable Pulse Generator Implant Date: 20201005

## 2022-02-07 DIAGNOSIS — M25552 Pain in left hip: Secondary | ICD-10-CM | POA: Diagnosis not present

## 2022-03-07 ENCOUNTER — Ambulatory Visit (INDEPENDENT_AMBULATORY_CARE_PROVIDER_SITE_OTHER): Payer: Medicare HMO

## 2022-03-07 DIAGNOSIS — R2990 Unspecified symptoms and signs involving the nervous system: Secondary | ICD-10-CM | POA: Diagnosis not present

## 2022-03-08 LAB — CUP PACEART REMOTE DEVICE CHECK
Date Time Interrogation Session: 20240107231813
Implantable Pulse Generator Implant Date: 20201005

## 2022-03-10 NOTE — Progress Notes (Signed)
Carelink Summary Report / Loop Recorder 

## 2022-03-30 DIAGNOSIS — Z8673 Personal history of transient ischemic attack (TIA), and cerebral infarction without residual deficits: Secondary | ICD-10-CM | POA: Diagnosis not present

## 2022-03-30 DIAGNOSIS — E7800 Pure hypercholesterolemia, unspecified: Secondary | ICD-10-CM | POA: Diagnosis not present

## 2022-03-30 DIAGNOSIS — I10 Essential (primary) hypertension: Secondary | ICD-10-CM | POA: Diagnosis not present

## 2022-03-30 DIAGNOSIS — E039 Hypothyroidism, unspecified: Secondary | ICD-10-CM | POA: Diagnosis not present

## 2022-03-30 DIAGNOSIS — M25559 Pain in unspecified hip: Secondary | ICD-10-CM | POA: Diagnosis not present

## 2022-03-30 DIAGNOSIS — E1169 Type 2 diabetes mellitus with other specified complication: Secondary | ICD-10-CM | POA: Diagnosis not present

## 2022-03-30 DIAGNOSIS — M810 Age-related osteoporosis without current pathological fracture: Secondary | ICD-10-CM | POA: Diagnosis not present

## 2022-03-30 DIAGNOSIS — I700 Atherosclerosis of aorta: Secondary | ICD-10-CM | POA: Diagnosis not present

## 2022-04-10 LAB — CUP PACEART REMOTE DEVICE CHECK
Date Time Interrogation Session: 20240209230844
Implantable Pulse Generator Implant Date: 20201005

## 2022-04-11 ENCOUNTER — Ambulatory Visit: Payer: Medicare HMO

## 2022-04-11 DIAGNOSIS — R2990 Unspecified symptoms and signs involving the nervous system: Secondary | ICD-10-CM | POA: Diagnosis not present

## 2022-04-12 NOTE — Progress Notes (Signed)
Carelink Summary Report / Loop Recorder 

## 2022-05-16 ENCOUNTER — Ambulatory Visit (INDEPENDENT_AMBULATORY_CARE_PROVIDER_SITE_OTHER): Payer: Medicare HMO

## 2022-05-16 DIAGNOSIS — R2990 Unspecified symptoms and signs involving the nervous system: Secondary | ICD-10-CM | POA: Diagnosis not present

## 2022-05-17 LAB — CUP PACEART REMOTE DEVICE CHECK
Date Time Interrogation Session: 20240317231243
Implantable Pulse Generator Implant Date: 20201005

## 2022-05-25 NOTE — Progress Notes (Signed)
Carelink Summary Report / Loop Recorder 

## 2022-06-20 ENCOUNTER — Ambulatory Visit: Payer: Medicare HMO

## 2022-06-20 LAB — CUP PACEART REMOTE DEVICE CHECK
Date Time Interrogation Session: 20240419230601
Implantable Pulse Generator Implant Date: 20201005

## 2022-06-23 ENCOUNTER — Ambulatory Visit (INDEPENDENT_AMBULATORY_CARE_PROVIDER_SITE_OTHER): Payer: Medicare HMO

## 2022-06-23 DIAGNOSIS — R2990 Unspecified symptoms and signs involving the nervous system: Secondary | ICD-10-CM | POA: Diagnosis not present

## 2022-06-27 DIAGNOSIS — M1611 Unilateral primary osteoarthritis, right hip: Secondary | ICD-10-CM | POA: Diagnosis not present

## 2022-06-27 NOTE — Progress Notes (Signed)
Carelink Summary Report / Loop Recorder 

## 2022-07-19 NOTE — Progress Notes (Signed)
Carelink Summary Report / Loop Recorder 

## 2022-07-26 ENCOUNTER — Ambulatory Visit (INDEPENDENT_AMBULATORY_CARE_PROVIDER_SITE_OTHER): Payer: Medicare HMO

## 2022-07-26 DIAGNOSIS — R2990 Unspecified symptoms and signs involving the nervous system: Secondary | ICD-10-CM

## 2022-07-26 LAB — CUP PACEART REMOTE DEVICE CHECK
Date Time Interrogation Session: 20240527230308
Implantable Pulse Generator Implant Date: 20201005

## 2022-08-02 IMAGING — DX DG ABDOMEN 2V
2 series · 2 of 2 positions shown · non-contrast
Comparison: 05/20/2018

CLINICAL DATA: Constipation

EXAM:
ABDOMEN - 2 VIEW

[dg abd 2 views (1 of 2)]
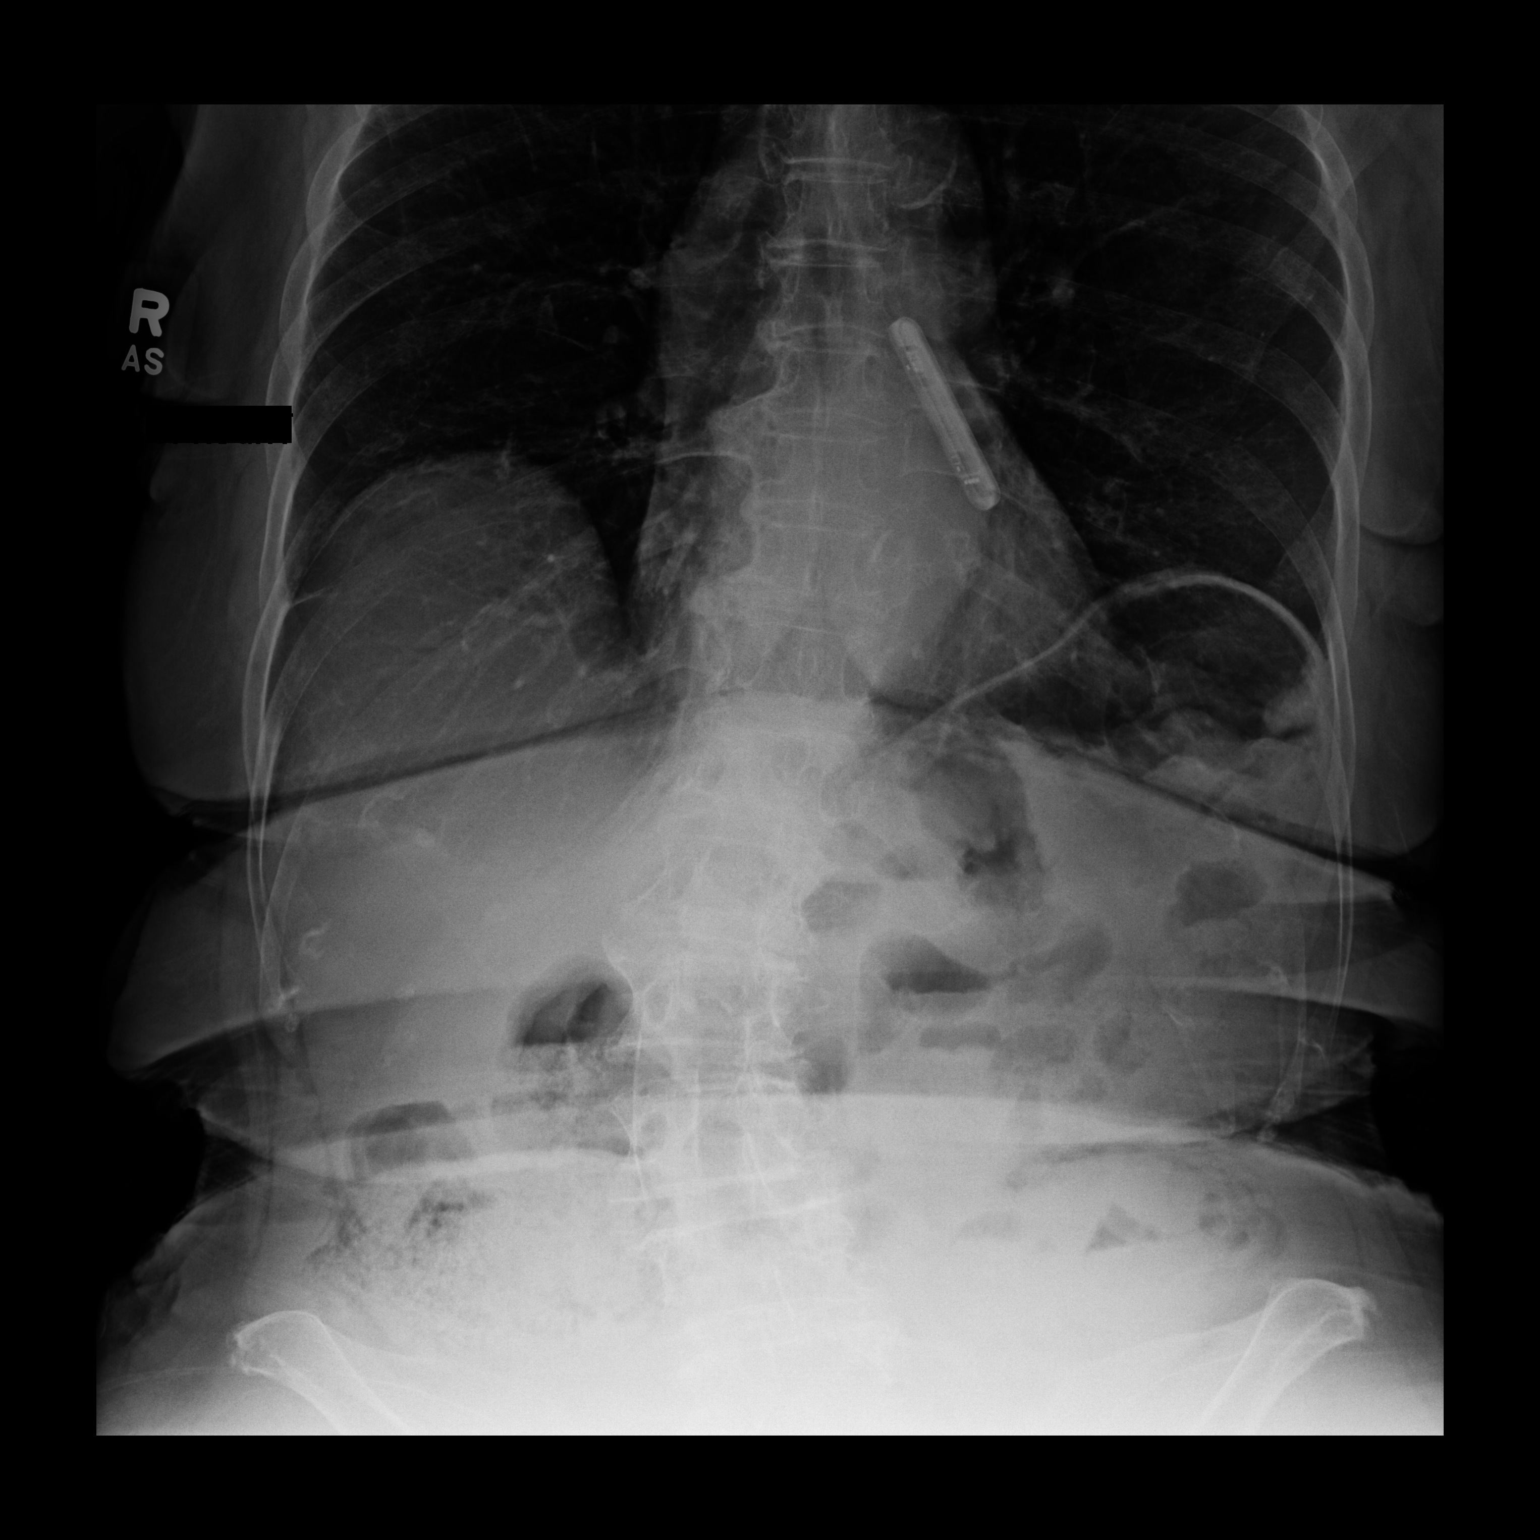

[dg abd 2 views (2 of 2)]
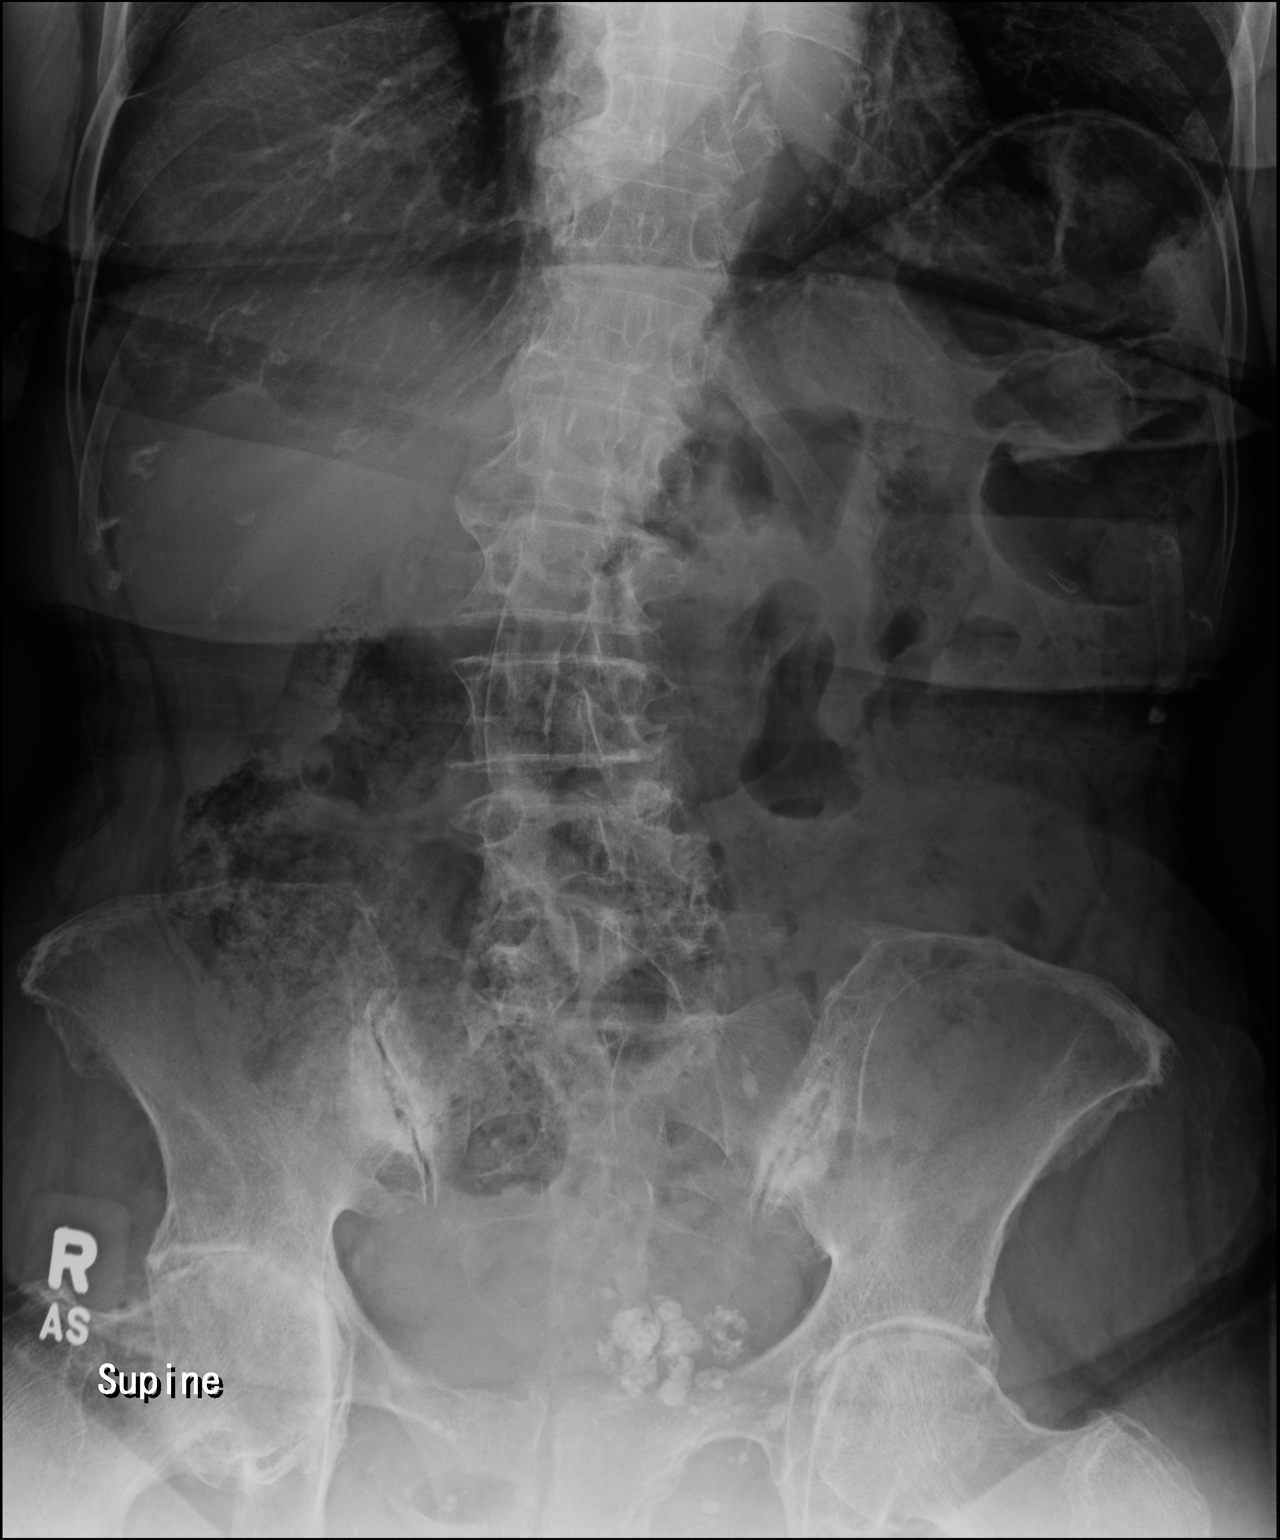

[2 of 2 positions shown; findings below may reference images not displayed]

FINDINGS: No free air beneath the diaphragm. Nonobstructed gas pattern with
moderate stool in the colon. Probable calcified fibroids in the
pelvis. Scoliosis and SI joint sclerosis.
IMPRESSION: Nonobstructed gas pattern with moderate stool in the colon.

## 2022-08-22 NOTE — Progress Notes (Signed)
Carelink Summary Report / Loop Recorder 

## 2022-08-29 ENCOUNTER — Ambulatory Visit (INDEPENDENT_AMBULATORY_CARE_PROVIDER_SITE_OTHER): Payer: Medicare HMO

## 2022-08-29 DIAGNOSIS — R2990 Unspecified symptoms and signs involving the nervous system: Secondary | ICD-10-CM

## 2022-08-29 LAB — CUP PACEART REMOTE DEVICE CHECK
Date Time Interrogation Session: 20240629230209
Implantable Pulse Generator Implant Date: 20201005

## 2022-08-31 DIAGNOSIS — M1611 Unilateral primary osteoarthritis, right hip: Secondary | ICD-10-CM | POA: Diagnosis not present

## 2022-09-15 NOTE — Progress Notes (Signed)
Carelink Summary Report / Loop Recorder 

## 2022-10-03 ENCOUNTER — Ambulatory Visit: Payer: Medicare HMO

## 2022-10-03 DIAGNOSIS — R2990 Unspecified symptoms and signs involving the nervous system: Secondary | ICD-10-CM | POA: Diagnosis not present

## 2022-10-18 NOTE — Progress Notes (Signed)
Carelink Summary Report / Loop Recorder 

## 2022-11-01 DIAGNOSIS — E7800 Pure hypercholesterolemia, unspecified: Secondary | ICD-10-CM | POA: Diagnosis not present

## 2022-11-01 DIAGNOSIS — I10 Essential (primary) hypertension: Secondary | ICD-10-CM | POA: Diagnosis not present

## 2022-11-01 DIAGNOSIS — E1169 Type 2 diabetes mellitus with other specified complication: Secondary | ICD-10-CM | POA: Diagnosis not present

## 2022-11-01 DIAGNOSIS — Z5181 Encounter for therapeutic drug level monitoring: Secondary | ICD-10-CM | POA: Diagnosis not present

## 2022-11-01 DIAGNOSIS — E119 Type 2 diabetes mellitus without complications: Secondary | ICD-10-CM | POA: Diagnosis not present

## 2022-11-01 DIAGNOSIS — E039 Hypothyroidism, unspecified: Secondary | ICD-10-CM | POA: Diagnosis not present

## 2022-11-01 DIAGNOSIS — G459 Transient cerebral ischemic attack, unspecified: Secondary | ICD-10-CM | POA: Diagnosis not present

## 2022-11-01 DIAGNOSIS — I700 Atherosclerosis of aorta: Secondary | ICD-10-CM | POA: Diagnosis not present

## 2022-11-01 DIAGNOSIS — Z1331 Encounter for screening for depression: Secondary | ICD-10-CM | POA: Diagnosis not present

## 2022-11-01 DIAGNOSIS — Z23 Encounter for immunization: Secondary | ICD-10-CM | POA: Diagnosis not present

## 2022-11-01 DIAGNOSIS — Z0000 Encounter for general adult medical examination without abnormal findings: Secondary | ICD-10-CM | POA: Diagnosis not present

## 2022-11-01 DIAGNOSIS — M810 Age-related osteoporosis without current pathological fracture: Secondary | ICD-10-CM | POA: Diagnosis not present

## 2022-11-02 LAB — CUP PACEART REMOTE DEVICE CHECK
Date Time Interrogation Session: 20240903230515
Implantable Pulse Generator Implant Date: 20201005

## 2022-11-07 ENCOUNTER — Ambulatory Visit (INDEPENDENT_AMBULATORY_CARE_PROVIDER_SITE_OTHER): Payer: Medicare HMO

## 2022-11-07 DIAGNOSIS — R2990 Unspecified symptoms and signs involving the nervous system: Secondary | ICD-10-CM | POA: Diagnosis not present

## 2022-11-24 NOTE — Progress Notes (Signed)
Carelink Summary Report / Loop Recorder 

## 2022-12-12 ENCOUNTER — Ambulatory Visit (INDEPENDENT_AMBULATORY_CARE_PROVIDER_SITE_OTHER): Payer: Medicare HMO

## 2022-12-12 DIAGNOSIS — R2990 Unspecified symptoms and signs involving the nervous system: Secondary | ICD-10-CM | POA: Diagnosis not present

## 2022-12-13 LAB — CUP PACEART REMOTE DEVICE CHECK
Date Time Interrogation Session: 20241013230916
Implantable Pulse Generator Implant Date: 20201005

## 2022-12-19 DIAGNOSIS — M25551 Pain in right hip: Secondary | ICD-10-CM | POA: Diagnosis not present

## 2022-12-27 NOTE — Progress Notes (Signed)
Carelink Summary Report / Loop Recorder 

## 2023-01-16 ENCOUNTER — Ambulatory Visit (INDEPENDENT_AMBULATORY_CARE_PROVIDER_SITE_OTHER): Payer: Medicare HMO

## 2023-01-16 DIAGNOSIS — R2990 Unspecified symptoms and signs involving the nervous system: Secondary | ICD-10-CM

## 2023-01-16 LAB — CUP PACEART REMOTE DEVICE CHECK
Date Time Interrogation Session: 20241117230217
Implantable Pulse Generator Implant Date: 20201005

## 2023-02-09 NOTE — Progress Notes (Signed)
Carelink Summary Report / Loop Recorder 

## 2023-02-20 ENCOUNTER — Ambulatory Visit: Payer: Medicare HMO

## 2023-02-20 DIAGNOSIS — R2990 Unspecified symptoms and signs involving the nervous system: Secondary | ICD-10-CM | POA: Diagnosis not present

## 2023-02-20 LAB — CUP PACEART REMOTE DEVICE CHECK
Date Time Interrogation Session: 20241222230307
Implantable Pulse Generator Implant Date: 20201005

## 2023-03-27 ENCOUNTER — Ambulatory Visit: Payer: Medicare HMO

## 2023-03-27 DIAGNOSIS — R2990 Unspecified symptoms and signs involving the nervous system: Secondary | ICD-10-CM | POA: Diagnosis not present

## 2023-03-27 LAB — CUP PACEART REMOTE DEVICE CHECK
Date Time Interrogation Session: 20250126230104
Implantable Pulse Generator Implant Date: 20201005

## 2023-03-30 NOTE — Progress Notes (Signed)
Carelink Summary Report / Loop Recorder

## 2023-04-12 DIAGNOSIS — M1611 Unilateral primary osteoarthritis, right hip: Secondary | ICD-10-CM | POA: Diagnosis not present

## 2023-05-01 ENCOUNTER — Ambulatory Visit: Payer: Medicare HMO

## 2023-05-01 DIAGNOSIS — R2990 Unspecified symptoms and signs involving the nervous system: Secondary | ICD-10-CM

## 2023-05-01 DIAGNOSIS — M810 Age-related osteoporosis without current pathological fracture: Secondary | ICD-10-CM | POA: Diagnosis not present

## 2023-05-01 DIAGNOSIS — I700 Atherosclerosis of aorta: Secondary | ICD-10-CM | POA: Diagnosis not present

## 2023-05-01 DIAGNOSIS — E039 Hypothyroidism, unspecified: Secondary | ICD-10-CM | POA: Diagnosis not present

## 2023-05-01 DIAGNOSIS — E7800 Pure hypercholesterolemia, unspecified: Secondary | ICD-10-CM | POA: Diagnosis not present

## 2023-05-01 DIAGNOSIS — Z8673 Personal history of transient ischemic attack (TIA), and cerebral infarction without residual deficits: Secondary | ICD-10-CM | POA: Diagnosis not present

## 2023-05-01 DIAGNOSIS — I10 Essential (primary) hypertension: Secondary | ICD-10-CM | POA: Diagnosis not present

## 2023-05-01 DIAGNOSIS — E1169 Type 2 diabetes mellitus with other specified complication: Secondary | ICD-10-CM | POA: Diagnosis not present

## 2023-05-02 LAB — CUP PACEART REMOTE DEVICE CHECK
Date Time Interrogation Session: 20250302230204
Implantable Pulse Generator Implant Date: 20201005

## 2023-05-05 NOTE — Progress Notes (Signed)
 Carelink Summary Report / Loop Recorder

## 2023-05-29 DIAGNOSIS — E039 Hypothyroidism, unspecified: Secondary | ICD-10-CM | POA: Diagnosis not present

## 2023-06-02 NOTE — Progress Notes (Signed)
 Carelink Summary Report / Loop Recorder

## 2023-06-02 NOTE — Addendum Note (Signed)
 Addended by: Geralyn Flash D on: 06/02/2023 09:54 AM   Modules accepted: Orders

## 2023-06-05 ENCOUNTER — Ambulatory Visit (INDEPENDENT_AMBULATORY_CARE_PROVIDER_SITE_OTHER): Payer: Medicare HMO

## 2023-06-05 DIAGNOSIS — R2990 Unspecified symptoms and signs involving the nervous system: Secondary | ICD-10-CM | POA: Diagnosis not present

## 2023-06-06 LAB — CUP PACEART REMOTE DEVICE CHECK
Date Time Interrogation Session: 20250406230246
Implantable Pulse Generator Implant Date: 20201005

## 2023-07-10 ENCOUNTER — Ambulatory Visit (INDEPENDENT_AMBULATORY_CARE_PROVIDER_SITE_OTHER): Payer: Medicare HMO

## 2023-07-10 DIAGNOSIS — R2990 Unspecified symptoms and signs involving the nervous system: Secondary | ICD-10-CM | POA: Diagnosis not present

## 2023-07-10 DIAGNOSIS — M1611 Unilateral primary osteoarthritis, right hip: Secondary | ICD-10-CM | POA: Diagnosis not present

## 2023-07-10 LAB — CUP PACEART REMOTE DEVICE CHECK
Date Time Interrogation Session: 20250511230938
Implantable Pulse Generator Implant Date: 20201005

## 2023-07-11 ENCOUNTER — Ambulatory Visit: Payer: Self-pay | Admitting: Internal Medicine

## 2023-07-20 NOTE — Progress Notes (Signed)
 Carelink Summary Report / Loop Recorder

## 2023-07-20 NOTE — Addendum Note (Signed)
 Addended by: Edra Govern D on: 07/20/2023 03:20 PM   Modules accepted: Orders

## 2023-08-10 ENCOUNTER — Ambulatory Visit (INDEPENDENT_AMBULATORY_CARE_PROVIDER_SITE_OTHER)

## 2023-08-10 DIAGNOSIS — R2990 Unspecified symptoms and signs involving the nervous system: Secondary | ICD-10-CM

## 2023-08-10 LAB — CUP PACEART REMOTE DEVICE CHECK
Date Time Interrogation Session: 20250611230229
Implantable Pulse Generator Implant Date: 20201005

## 2023-08-13 ENCOUNTER — Ambulatory Visit: Payer: Self-pay | Admitting: Internal Medicine

## 2023-08-14 ENCOUNTER — Ambulatory Visit: Payer: Medicare HMO

## 2023-08-28 NOTE — Progress Notes (Signed)
 Carelink Summary Report / Loop Recorder

## 2023-09-06 DIAGNOSIS — M810 Age-related osteoporosis without current pathological fracture: Secondary | ICD-10-CM | POA: Diagnosis not present

## 2023-09-06 DIAGNOSIS — E7800 Pure hypercholesterolemia, unspecified: Secondary | ICD-10-CM | POA: Diagnosis not present

## 2023-09-06 DIAGNOSIS — I700 Atherosclerosis of aorta: Secondary | ICD-10-CM | POA: Diagnosis not present

## 2023-09-06 DIAGNOSIS — Z8673 Personal history of transient ischemic attack (TIA), and cerebral infarction without residual deficits: Secondary | ICD-10-CM | POA: Diagnosis not present

## 2023-09-06 DIAGNOSIS — M25559 Pain in unspecified hip: Secondary | ICD-10-CM | POA: Diagnosis not present

## 2023-09-06 DIAGNOSIS — E1169 Type 2 diabetes mellitus with other specified complication: Secondary | ICD-10-CM | POA: Diagnosis not present

## 2023-09-06 DIAGNOSIS — I10 Essential (primary) hypertension: Secondary | ICD-10-CM | POA: Diagnosis not present

## 2023-09-06 DIAGNOSIS — E039 Hypothyroidism, unspecified: Secondary | ICD-10-CM | POA: Diagnosis not present

## 2023-09-11 ENCOUNTER — Ambulatory Visit (INDEPENDENT_AMBULATORY_CARE_PROVIDER_SITE_OTHER)

## 2023-09-11 ENCOUNTER — Ambulatory Visit: Payer: Self-pay | Admitting: Internal Medicine

## 2023-09-11 DIAGNOSIS — R2990 Unspecified symptoms and signs involving the nervous system: Secondary | ICD-10-CM | POA: Diagnosis not present

## 2023-09-11 LAB — CUP PACEART REMOTE DEVICE CHECK
Date Time Interrogation Session: 20250713231353
Implantable Pulse Generator Implant Date: 20201005

## 2023-09-18 ENCOUNTER — Ambulatory Visit: Payer: Medicare HMO

## 2023-10-02 DIAGNOSIS — M1611 Unilateral primary osteoarthritis, right hip: Secondary | ICD-10-CM | POA: Diagnosis not present

## 2023-10-04 NOTE — Progress Notes (Signed)
 Carelink Summary Report / Loop Recorder

## 2023-10-12 ENCOUNTER — Ambulatory Visit (INDEPENDENT_AMBULATORY_CARE_PROVIDER_SITE_OTHER)

## 2023-10-12 DIAGNOSIS — R2990 Unspecified symptoms and signs involving the nervous system: Secondary | ICD-10-CM | POA: Diagnosis not present

## 2023-10-12 LAB — CUP PACEART REMOTE DEVICE CHECK
Date Time Interrogation Session: 20250813230842
Implantable Pulse Generator Implant Date: 20201005

## 2023-10-15 ENCOUNTER — Ambulatory Visit: Payer: Self-pay | Admitting: Internal Medicine

## 2023-10-23 ENCOUNTER — Ambulatory Visit: Payer: Medicare HMO

## 2023-11-13 ENCOUNTER — Ambulatory Visit

## 2023-11-13 DIAGNOSIS — R2990 Unspecified symptoms and signs involving the nervous system: Secondary | ICD-10-CM | POA: Diagnosis not present

## 2023-11-13 LAB — CUP PACEART REMOTE DEVICE CHECK
Date Time Interrogation Session: 20250913230419
Implantable Pulse Generator Implant Date: 20201005

## 2023-11-17 ENCOUNTER — Ambulatory Visit: Payer: Self-pay | Admitting: Internal Medicine

## 2023-11-17 NOTE — Progress Notes (Signed)
 Remote Loop Recorder Transmission

## 2023-11-22 NOTE — Progress Notes (Signed)
 Remote Loop Recorder Transmission

## 2023-11-27 ENCOUNTER — Ambulatory Visit: Payer: Medicare HMO

## 2023-11-27 ENCOUNTER — Telehealth: Payer: Self-pay | Admitting: Student

## 2023-11-27 NOTE — Telephone Encounter (Signed)
 Pt wants to know how long she has to keep Loop Recorder in. Please advise

## 2023-11-27 NOTE — Telephone Encounter (Signed)
 Spoke to patient she requested appointment with Dr.Taylor.Stated she has several questions and concerns about loop recorder.She will be bringing daughter to appointment.Appointment scheduled with Dr.Taylor 10/17 at 11:30 am.

## 2023-12-01 ENCOUNTER — Ambulatory Visit: Admitting: Internal Medicine

## 2023-12-06 DIAGNOSIS — E119 Type 2 diabetes mellitus without complications: Secondary | ICD-10-CM | POA: Diagnosis not present

## 2023-12-06 DIAGNOSIS — E785 Hyperlipidemia, unspecified: Secondary | ICD-10-CM | POA: Diagnosis not present

## 2023-12-06 DIAGNOSIS — M1611 Unilateral primary osteoarthritis, right hip: Secondary | ICD-10-CM | POA: Diagnosis not present

## 2023-12-06 DIAGNOSIS — E039 Hypothyroidism, unspecified: Secondary | ICD-10-CM | POA: Diagnosis not present

## 2023-12-07 NOTE — Progress Notes (Signed)
 Remote Loop Recorder Transmission

## 2023-12-12 ENCOUNTER — Ambulatory Visit (INDEPENDENT_AMBULATORY_CARE_PROVIDER_SITE_OTHER)

## 2023-12-12 DIAGNOSIS — R2990 Unspecified symptoms and signs involving the nervous system: Secondary | ICD-10-CM

## 2023-12-12 LAB — CUP PACEART REMOTE DEVICE CHECK
Date Time Interrogation Session: 20251013230417
Implantable Pulse Generator Implant Date: 20201005

## 2023-12-13 NOTE — Progress Notes (Signed)
 Remote Loop Recorder Transmission

## 2023-12-14 ENCOUNTER — Ambulatory Visit: Payer: Self-pay | Admitting: Internal Medicine

## 2023-12-14 ENCOUNTER — Ambulatory Visit

## 2023-12-15 ENCOUNTER — Encounter: Payer: Self-pay | Admitting: Internal Medicine

## 2023-12-15 ENCOUNTER — Ambulatory Visit: Attending: Internal Medicine | Admitting: Internal Medicine

## 2023-12-15 VITALS — BP 170/77 | HR 78 | Resp 16 | Ht 63.0 in | Wt 113.6 lb

## 2023-12-15 DIAGNOSIS — R2990 Unspecified symptoms and signs involving the nervous system: Secondary | ICD-10-CM

## 2023-12-15 NOTE — Progress Notes (Signed)
 HPI Dana Saunders returns today for followup. She is a pleasant 89 yo woman with a h/o cryptogenic stroke who underwent ILR insertion over 5 years ago. Amazingly her ILR battery is still working. The patient has not been found to have atrial fib. She denies chest pain or sob. No falls. She uses a walker. She does not have any pain around her ILR insertion site.  No Known Allergies   Current Outpatient Medications  Medication Sig Dispense Refill   alendronate (FOSAMAX) 70 MG tablet Take 70 mg by mouth once a week. Friday     aspirin  EC 81 MG EC tablet Take 1 tablet (81 mg total) by mouth daily.     cetirizine (ZYRTEC) 10 MG tablet Take 10 mg by mouth daily.     feeding supplement, ENSURE ENLIVE, (ENSURE ENLIVE) LIQD Take 237 mLs by mouth 2 (two) times daily between meals. 237 mL 12   levothyroxine  (SYNTHROID , LEVOTHROID) 88 MCG tablet Take 88 mcg by mouth every morning.     losartan (COZAAR) 50 MG tablet Take 50 mg by mouth daily.     metFORMIN (GLUCOPHAGE) 500 MG tablet Take 500 mg by mouth 2 (two) times daily.      Multiple Vitamin (MULTIVITAMIN WITH MINERALS) TABS tablet Take 1 tablet by mouth daily. (Patient taking differently: Take 1 tablet by mouth daily. 1x daily) 30 tablet 0   polyethylene glycol (MIRALAX  / GLYCOLAX ) packet Take 17 g by mouth daily. 14 each 0   pravastatin  (PRAVACHOL ) 40 MG tablet Take 40 mg by mouth daily.     No current facility-administered medications for this visit.     Past Medical History:  Diagnosis Date   Diabetes mellitus without complication (HCC)    Hyperlipemia    Hypertension    Stroke (HCC)     ROS:   All systems reviewed and negative except as noted in the HPI.   Past Surgical History:  Procedure Laterality Date   ESOPHAGOGASTRODUODENOSCOPY (EGD) WITH PROPOFOL  N/A 05/20/2018   Procedure: ESOPHAGOGASTRODUODENOSCOPY (EGD) WITH PROPOFOL ;  Surgeon: Saintclair Jasper, MD;  Location: [facility] ENDOSCOPY;  Service: Gastroenterology;  Laterality: N/A;    GASTROSTOMY N/A 05/20/2018   Procedure: Insertion Of Gastrostomy Tube and Repair of Volvulus;  Surgeon: Sebastian Moles, MD;  Location: [facility] OR;  Service: General;  Laterality: N/A;   LAPAROTOMY N/A 05/20/2018   Procedure: EXPLORATORY LAPAROTOMY;  Surgeon: Sebastian Moles, MD;  Location: [facility] OR;  Service: General;  Laterality: N/A;   LOOP RECORDER IMPLANT  10/2018   LOOP RECORDER INSERTION N/A 12/03/2018   Procedure: LOOP RECORDER INSERTION;  Surgeon: Waddell Danelle ORN, MD;  Location: [facility] INVASIVE CV LAB;  Service: Cardiovascular;  Laterality: N/A;     Family History  Problem Relation Age of Onset   Diabetes Mother    Cancer Father      Social History   Socioeconomic History   Marital status: Widowed    Spouse name: Not on file   Number of children: 3   Years of education: 46   Highest education level: Not on file  Occupational History    Comment: retired  Tobacco Use   Smoking status: Never   Smokeless tobacco: Never  Vaping Use   Vaping status: Never Used  Substance and Sexual Activity   Alcohol use: Not Currently   Drug use: Never   Sexual activity: Not on file  Other Topics Concern   Not on file  Social History Narrative   Lives with dgtr, Luke  Caffeine- one daily   Social Drivers of Corporate investment banker Strain: Not on file  Food Insecurity: Not on file  Transportation Needs: Not on file  Physical Activity: Not on file  Stress: Not on file  Social Connections: Not on file  Intimate Partner Violence: Not on file     BP (!) 170/77 (BP Location: Left Arm, Patient Position: Sitting, Cuff Size: Normal)   Pulse 78   Resp 16   Ht 5' 3 (1.6 m)   Wt 113 lb 9.6 oz (51.5 kg)   SpO2 93%   BMI 20.12 kg/m   Physical Exam:  Well appearing NAD HEENT: Unremarkable Neck:  No JVD, no thyromegally Lymphatics:  No adenopathy Back:  No CVA tenderness Lungs:  Clear with no wheezes HEART:  Regular rate rhythm, no murmurs, no rubs, no clicks Abd:  soft, positive  bowel sounds, no organomegally, no rebound, no guarding Ext:  2 plus pulses, no edema, no cyanosis, no clubbing Skin:  No rashes no nodules Neuro:  CN II through XII intact, motor grossly intact  EKG - nsr  DEVICE  Normal device function.  See PaceArt for details. No atrial fib  Assess/Plan: Cryptogenic stroke - she has not had afib and has not had any other neuro symptoms. She has recovered from her stroke very nicely. We will continue to monitor though I'll be surprised if we see any afib.  At his point, I would not recommend removal of the ILR.   Danelle Claudell Wohler,MD

## 2023-12-15 NOTE — Patient Instructions (Signed)
 Medication Instructions:  Your physician recommends that you continue on your current medications as directed. Please refer to the Current Medication list given to you today.  *If you need a refill on your cardiac medications before your next appointment, please call your pharmacy*  Lab Work: None ordered.  You may go to any [facility] Location for your lab work:  [facility] - [address] ([facility]) - [address] [phone] [address]   - [address]   [place]  - [address]   [place] - [address] - [address] [employer]  - [address] ([facility]   If you have labs (blood work) drawn today and your tests are completely normal, you will receive your results only by: [employer] (if you have MyChart)  If you have any lab test that is abnormal or we need to change your treatment, we will call you or send a MyChart message to review the results.  Testing/Procedures: None ordered.  Follow-Up: At [facility], you and your health needs are our priority.  As part of our continuing mission to provide you with exceptional heart care, we have created designated Provider Care Teams.  These Care Teams include your primary Cardiologist (physician) and Advanced Practice Providers (APPs -  Physician Assistants and Nurse Practitioners) who all work together to provide you with the care you need, when you need it.  We recommend signing up for the patient portal called "MyChart".  Sign up information is provided on this After Visit Summary.  MyChart is used to connect with patients for Virtual Visits (Telemedicine).  Patients are able to view lab/test results, encounter notes, upcoming appointments, etc.  Non-urgent messages can be sent to your provider as well.   To learn more about what you can do with MyChart, go to  [email or web address].    Your next appointment:   As needed

## 2024-01-01 ENCOUNTER — Ambulatory Visit: Payer: Medicare HMO

## 2024-01-12 ENCOUNTER — Ambulatory Visit

## 2024-01-12 DIAGNOSIS — R2990 Unspecified symptoms and signs involving the nervous system: Secondary | ICD-10-CM | POA: Diagnosis not present

## 2024-01-12 LAB — CUP PACEART REMOTE DEVICE CHECK
Date Time Interrogation Session: 20251113230533
Implantable Pulse Generator Implant Date: 20201005

## 2024-01-14 ENCOUNTER — Ambulatory Visit: Payer: Self-pay | Admitting: Internal Medicine

## 2024-01-16 NOTE — Progress Notes (Signed)
 Remote Loop Recorder Transmission

## 2024-01-22 DIAGNOSIS — M1611 Unilateral primary osteoarthritis, right hip: Secondary | ICD-10-CM | POA: Diagnosis not present

## 2024-01-31 DIAGNOSIS — Z1331 Encounter for screening for depression: Secondary | ICD-10-CM | POA: Diagnosis not present

## 2024-01-31 DIAGNOSIS — E039 Hypothyroidism, unspecified: Secondary | ICD-10-CM | POA: Diagnosis not present

## 2024-01-31 DIAGNOSIS — E7800 Pure hypercholesterolemia, unspecified: Secondary | ICD-10-CM | POA: Diagnosis not present

## 2024-01-31 DIAGNOSIS — Z8673 Personal history of transient ischemic attack (TIA), and cerebral infarction without residual deficits: Secondary | ICD-10-CM | POA: Diagnosis not present

## 2024-01-31 DIAGNOSIS — Z0000 Encounter for general adult medical examination without abnormal findings: Secondary | ICD-10-CM | POA: Diagnosis not present

## 2024-01-31 DIAGNOSIS — E1169 Type 2 diabetes mellitus with other specified complication: Secondary | ICD-10-CM | POA: Diagnosis not present

## 2024-01-31 DIAGNOSIS — I10 Essential (primary) hypertension: Secondary | ICD-10-CM | POA: Diagnosis not present

## 2024-02-12 ENCOUNTER — Ambulatory Visit

## 2024-02-12 DIAGNOSIS — R2990 Unspecified symptoms and signs involving the nervous system: Secondary | ICD-10-CM

## 2024-02-13 LAB — CUP PACEART REMOTE DEVICE CHECK
Date Time Interrogation Session: 20251214230247
Implantable Pulse Generator Implant Date: 20201005

## 2024-02-16 NOTE — Progress Notes (Signed)
 Remote Loop Recorder Transmission

## 2024-02-25 ENCOUNTER — Ambulatory Visit: Payer: Self-pay | Admitting: Internal Medicine

## 2024-03-14 ENCOUNTER — Ambulatory Visit

## 2024-03-14 DIAGNOSIS — R2990 Unspecified symptoms and signs involving the nervous system: Secondary | ICD-10-CM

## 2024-03-14 LAB — CUP PACEART REMOTE DEVICE CHECK
Date Time Interrogation Session: 20260114230203
Implantable Pulse Generator Implant Date: 20201005

## 2024-03-15 ENCOUNTER — Ambulatory Visit: Payer: Self-pay | Admitting: Cardiovascular Disease

## 2024-03-22 ENCOUNTER — Telehealth: Payer: Self-pay

## 2024-03-22 NOTE — Progress Notes (Signed)
 Remote Loop Recorder Transmission

## 2024-03-22 NOTE — Telephone Encounter (Signed)
 ILR reached RRT: 03/21/2024  Marked I in Paceart: Done Enter note in Paceart: Done Canceled future remotes: Done Discontinued from website: Done Entered in Speciality Comments: Done  Attempted to contact patient. No answer, left message to call back.

## 2024-03-27 NOTE — Telephone Encounter (Signed)
 Spoke to pts daughter Reena, who states patient would like to leave device in. Advised to call if any further questions. Was appreciative of call.

## 2024-04-14 ENCOUNTER — Ambulatory Visit

## 2024-05-15 ENCOUNTER — Ambulatory Visit

## 2024-06-15 ENCOUNTER — Ambulatory Visit
